# Patient Record
Sex: Male | Born: 1975 | Race: White | Hispanic: No | Marital: Married | Smoking: Former smoker
Health system: Southern US, Community
[De-identification: ages and names within clinical notes are randomized; demographics above are authoritative.]

## PROBLEM LIST (undated history)

## (undated) DIAGNOSIS — M1990 Unspecified osteoarthritis, unspecified site: Secondary | ICD-10-CM

## (undated) DIAGNOSIS — G43909 Migraine, unspecified, not intractable, without status migrainosus: Secondary | ICD-10-CM

## (undated) HISTORY — PX: ANKLE SURGERY: SHX546

## (undated) HISTORY — DX: Unspecified osteoarthritis, unspecified site: M19.90

## (undated) HISTORY — PX: OTHER SURGICAL HISTORY: SHX169

## (undated) HISTORY — PX: NASAL SINUS SURGERY: SHX719

## (undated) HISTORY — DX: Migraine, unspecified, not intractable, without status migrainosus: G43.909

---

## 2009-01-06 ENCOUNTER — Encounter: Admission: RE | Admit: 2009-01-06 | Discharge: 2009-01-06 | Payer: Self-pay | Admitting: Rheumatology

## 2016-06-29 ENCOUNTER — Other Ambulatory Visit: Payer: Self-pay | Admitting: Rheumatology

## 2016-06-29 LAB — COMPLETE METABOLIC PANEL WITH GFR
ALT: 21 U/L (ref 9–46)
AST: 18 U/L (ref 10–40)
Albumin: 4.6 g/dL (ref 3.6–5.1)
Alkaline Phosphatase: 90 U/L (ref 40–115)
BUN: 18 mg/dL (ref 7–25)
CHLORIDE: 110 mmol/L (ref 98–110)
CO2: 21 mmol/L (ref 20–31)
CREATININE: 1.31 mg/dL (ref 0.60–1.35)
Calcium: 9.9 mg/dL (ref 8.6–10.3)
GFR, EST AFRICAN AMERICAN: 79 mL/min (ref >=60)
GFR, Est Non African American: 68 mL/min (ref >=60)
Glucose, Bld: 101 mg/dL — ABNORMAL HIGH (ref 65–99)
POTASSIUM: 4 mmol/L (ref 3.5–5.3)
Sodium: 140 mmol/L (ref 135–146)
Total Bilirubin: 0.6 mg/dL (ref 0.2–1.2)
Total Protein: 6.9 g/dL (ref 6.1–8.1)

## 2016-06-29 LAB — CBC WITH DIFFERENTIAL/PLATELET
BASOS PCT: 0 %
Basophils Absolute: 0 cells/uL (ref 0–200)
EOS ABS: 284 {cells}/uL (ref 15–500)
Eosinophils Relative: 4 %
HEMATOCRIT: 45.5 % (ref 38.5–50.0)
HEMOGLOBIN: 16 g/dL (ref 13.2–17.1)
LYMPHS ABS: 2343 {cells}/uL (ref 850–3900)
LYMPHS PCT: 33 %
MCH: 31.6 pg (ref 27.0–33.0)
MCHC: 35.2 g/dL (ref 32.0–36.0)
MCV: 89.9 fL (ref 80.0–100.0)
MONO ABS: 710 {cells}/uL (ref 200–950)
MPV: 10.8 fL (ref 7.5–12.5)
Monocytes Relative: 10 %
NEUTROS ABS: 3763 {cells}/uL (ref 1500–7800)
Neutrophils Relative %: 53 %
Platelets: 278 10*3/uL (ref 140–400)
RBC: 5.06 MIL/uL (ref 4.20–5.80)
RDW: 13.6 % (ref 11.0–15.0)
WBC: 7.1 10*3/uL (ref 3.8–10.8)

## 2016-07-03 ENCOUNTER — Telehealth: Payer: Self-pay | Admitting: Radiology

## 2016-07-03 NOTE — Telephone Encounter (Signed)
I will call patient to advise labs are normal  

## 2016-07-03 NOTE — Telephone Encounter (Signed)
Number disconnected.

## 2016-08-15 ENCOUNTER — Other Ambulatory Visit: Payer: Self-pay | Admitting: *Deleted

## 2016-08-15 MED ORDER — ADALIMUMAB 40 MG/0.8ML ~~LOC~~ AJKT: 0.8000 mL | AUTO-INJECTOR | SUBCUTANEOUS | 0 refills | Status: DC

## 2016-08-15 NOTE — Telephone Encounter (Signed)
Last Visit:04/19/16 Next Visit due February 2018 Labs: 06/29/16 TB Gold: 12/27/15  Okay to refill Humira?

## 2016-08-15 NOTE — Telephone Encounter (Signed)
ok 

## 2016-10-12 ENCOUNTER — Other Ambulatory Visit: Payer: Self-pay | Admitting: Rheumatology

## 2016-10-12 DIAGNOSIS — M47819 Spondylosis without myelopathy or radiculopathy, site unspecified: Secondary | ICD-10-CM | POA: Insufficient documentation

## 2016-10-12 DIAGNOSIS — Z1589 Genetic susceptibility to other disease: Secondary | ICD-10-CM | POA: Insufficient documentation

## 2016-10-12 LAB — CBC WITH DIFFERENTIAL/PLATELET
BASOS PCT: 0 %
Basophils Absolute: 0 cells/uL (ref 0–200)
Eosinophils Absolute: 158 cells/uL (ref 15–500)
Eosinophils Relative: 2 %
HCT: 42.7 % (ref 38.5–50.0)
Hemoglobin: 14.7 g/dL (ref 13.2–17.1)
LYMPHS PCT: 24 %
Lymphs Abs: 1896 cells/uL (ref 850–3900)
MCH: 31.1 pg (ref 27.0–33.0)
MCHC: 34.4 g/dL (ref 32.0–36.0)
MCV: 90.3 fL (ref 80.0–100.0)
MONOS PCT: 8 %
MPV: 10.3 fL (ref 7.5–12.5)
Monocytes Absolute: 632 cells/uL (ref 200–950)
NEUTROS ABS: 5214 {cells}/uL (ref 1500–7800)
Neutrophils Relative %: 66 %
PLATELETS: 267 10*3/uL (ref 140–400)
RBC: 4.73 MIL/uL (ref 4.20–5.80)
RDW: 13.4 % (ref 11.0–15.0)
WBC: 7.9 10*3/uL (ref 3.8–10.8)

## 2016-10-12 NOTE — Progress Notes (Deleted)
Office Visit Note  Patient: Dennis Todd             Date of Birth: 01-08-1976           MRN: [id number]             PCP: No primary care provider on file. Referring: No ref. provider found Visit Date: 10/23/2016 Occupation: '@GUAROCC' @    Subjective:  No chief complaint on file.   History of Present Illness: Dennis Todd is a 41 y.o. male ***   Activities of Daily Living:  Patient reports morning stiffness for *** {minute/hour:19697}.   Patient {ACTIONS;DENIES/REPORTS:21021675::"Denies"} nocturnal pain.  Difficulty dressing/grooming: {ACTIONS;DENIES/REPORTS:21021675::"Denies"} Difficulty climbing stairs: {ACTIONS;DENIES/REPORTS:21021675::"Denies"} Difficulty getting out of chair: {ACTIONS;DENIES/REPORTS:21021675::"Denies"} Difficulty using hands for taps, buttons, cutlery, and/or writing: {ACTIONS;DENIES/REPORTS:21021675::"Denies"}   No Rheumatology ROS completed.   PMFS History:  Patient Active Problem List   Diagnosis Date Noted  . 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 HLA B27 (HLA B27 positive) 10/12/2016    No past medical history on file.  No family history on file. No past surgical history on file. Social History   Social History Narrative  . No narrative on file     Objective: Vital Signs: There were no vitals taken for this visit.   Physical Exam   Musculoskeletal Exam: ***  CDAI Exam: No CDAI exam completed.    Investigation: Findings:   His labs from 03-30-2008 showed CBC with diff normal, sed rate normal, comprehensive normal, glucose was 112, alkaline phosphatase was elevated at 130. His hepatitis panel was normal. His rheumatoid factor was normal, CCP was normal and G6PD was normal.    August 2017; CBC and comprehensive metabolic panel was normal.  May 2017 TB Gold was negative.  No visits with results within 3 Month(s) from this visit.  Latest known visit with results is:  Orders Only on 06/29/2016  Component Date Value Ref Range  Status  . Sodium 06/29/2016 140  135 - 146 mmol/L Final  . Potassium 06/29/2016 4.0  3.5 - 5.3 mmol/L Final  . Chloride 06/29/2016 110  98 - 110 mmol/L Final  . CO2 06/29/2016 21  20 - 31 mmol/L Final  . Glucose, Bld 06/29/2016 101* 65 - 99 mg/dL Final  . BUN 06/29/2016 18  7 - 25 mg/dL Final  . Creat 06/29/2016 1.31  0.60 - 1.35 mg/dL Final  . Total Bilirubin 06/29/2016 0.6  0.2 - 1.2 mg/dL Final  . Alkaline Phosphatase 06/29/2016 90  40 - 115 U/L Final  . AST 06/29/2016 18  10 - 40 U/L Final  . ALT 06/29/2016 21  9 - 46 U/L Final  . Total Protein 06/29/2016 6.9  6.1 - 8.1 g/dL Final  . Albumin 06/29/2016 4.6  3.6 - 5.1 g/dL Final  . Calcium 06/29/2016 9.9  8.6 - 10.3 mg/dL Final  . GFR, Est African American 06/29/2016 79  >=60 mL/min Final  . GFR, Est Non African American 06/29/2016 68  >=60 mL/min Final  . WBC 06/29/2016 7.1  3.8 - 10.8 K/uL Final  . RBC 06/29/2016 5.06  4.20 - 5.80 MIL/uL Final  . Hemoglobin 06/29/2016 16.0  13.2 - 17.1 g/dL Final  . HCT 06/29/2016 45.5  38.5 - 50.0 % Final  . MCV 06/29/2016 89.9  80.0 - 100.0 fL Final  . MCH 06/29/2016 31.6  27.0 - 33.0 pg Final  . MCHC 06/29/2016 35.2  32.0 - 36.0 g/dL Final  . RDW 06/29/2016 13.6  11.0 - 15.0 % Final  .  Platelets 06/29/2016 278  140 - 400 K/uL Final  . MPV 06/29/2016 10.8  7.5 - 12.5 fL Final  . Neutro Abs 06/29/2016 3763  1,500 - 7,800 cells/uL Final  . Lymphs Abs 06/29/2016 2343  850 - 3,900 cells/uL Final  . Monocytes Absolute 06/29/2016 710  200 - 950 cells/uL Final  . Eosinophils Absolute 06/29/2016 284  15 - 500 cells/uL Final  . Basophils Absolute 06/29/2016 0  0 - 200 cells/uL Final  . Neutrophils Relative % 06/29/2016 53  % Final  . Lymphocytes Relative 06/29/2016 33  % Final  . Monocytes Relative 06/29/2016 10  % Final  . Eosinophils Relative 06/29/2016 4  % Final  . Basophils Relative 06/29/2016 0  % Final  . Smear Review 06/29/2016 Criteria for review not met   Final     Imaging: No  results found.  Speciality Comments: No specialty comments available.    Procedures:  No procedures performed Allergies: Patient has no allergy information on record.   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  No orders of the defined types were placed in this encounter.   Face-to-face time spent with patient was *** minutes. 50% of time was spent in counseling and coordination of care.  Follow-Up Instructions: No Follow-up on file.   Amy Littrell, RT  Note - This record has been created using [employer].  Chart creation errors have been sought, but may not always  have been located. Such creation errors do not reflect on  the standard of medical care.

## 2016-10-13 LAB — COMPLETE METABOLIC PANEL WITH GFR
ALT: 18 U/L (ref 9–46)
AST: 17 U/L (ref 10–40)
Albumin: 4.2 g/dL (ref 3.6–5.1)
Alkaline Phosphatase: 90 U/L (ref 40–115)
BILIRUBIN TOTAL: 0.6 mg/dL (ref 0.2–1.2)
BUN: 17 mg/dL (ref 7–25)
CALCIUM: 9.1 mg/dL (ref 8.6–10.3)
CHLORIDE: 110 mmol/L (ref 98–110)
CO2: 21 mmol/L (ref 20–31)
CREATININE: 1.42 mg/dL — AB (ref 0.60–1.35)
GFR, EST AFRICAN AMERICAN: 71 mL/min (ref >=60)
GFR, EST NON AFRICAN AMERICAN: 62 mL/min (ref >=60)
Glucose, Bld: 124 mg/dL — ABNORMAL HIGH (ref 65–99)
Potassium: 3.6 mmol/L (ref 3.5–5.3)
Sodium: 141 mmol/L (ref 135–146)
Total Protein: 6.5 g/dL (ref 6.1–8.1)

## 2016-10-15 NOTE — Progress Notes (Signed)
Please ask him to take enough fluids. Repeat BMP in 1 month

## 2016-10-16 ENCOUNTER — Other Ambulatory Visit: Payer: Self-pay | Admitting: *Deleted

## 2016-10-16 ENCOUNTER — Telehealth (INDEPENDENT_AMBULATORY_CARE_PROVIDER_SITE_OTHER): Payer: Self-pay | Admitting: Rheumatology

## 2016-10-16 DIAGNOSIS — Z79899 Other long term (current) drug therapy: Secondary | ICD-10-CM

## 2016-10-16 NOTE — Telephone Encounter (Signed)
Patient returned your call regarding his labs.  Cb#[phone].  Thank you.

## 2016-10-16 NOTE — Telephone Encounter (Signed)
Patient advised of lab results and verbalized understanding.  

## 2016-10-20 DIAGNOSIS — B360 Pityriasis versicolor: Secondary | ICD-10-CM | POA: Insufficient documentation

## 2016-10-20 DIAGNOSIS — Z8709 Personal history of other diseases of the respiratory system: Secondary | ICD-10-CM | POA: Insufficient documentation

## 2016-10-20 DIAGNOSIS — Z79899 Other long term (current) drug therapy: Secondary | ICD-10-CM | POA: Insufficient documentation

## 2016-10-23 ENCOUNTER — Ambulatory Visit: Payer: Self-pay | Admitting: Rheumatology

## 2016-10-29 ENCOUNTER — Other Ambulatory Visit: Payer: Self-pay | Admitting: *Deleted

## 2016-10-29 MED ORDER — ADALIMUMAB 40 MG/0.8ML ~~LOC~~ AJKT: 0.8000 mL | AUTO-INJECTOR | SUBCUTANEOUS | 0 refills | Status: DC

## 2016-10-29 NOTE — Telephone Encounter (Signed)
Refill request received via fax  Last Visit:04/19/16 Next Visit 12/19/16 Labs: 10/12/16 Creat 1.42 previous creat 1.31 TB Gold: 12/27/15  Okay to refill Humira?

## 2016-10-29 NOTE — Telephone Encounter (Signed)
ok 

## 2016-12-14 NOTE — Progress Notes (Signed)
Office Visit Note  Patient: Dennis Todd             Date of Birth: Jul 12, 1976           MRN: [id number]             PCP: Patient, No Pcp Per Referring: No ref. provider found Visit Date: 12/19/2016 Occupation: _0 @    Subjective:  Left hand pain   History of Present Illness: [name] is a 41 y.o. male with history of a spondyloarthropathy. He states she's been having pain and discomfort in his left third and fourth finger. He was wearing a ring which he took off. He has significant morning stiffness. He also reports a stiffness after prolonged sitting. He has to use a small tools at work which is also difficult  on his hands. He has history of migraines which got worse recently. He was seen by a neurologist and was started on Topamax. He has noticed improvement in the migraine. He  was taking anti-inflammatories for headaches. Which were discontinued by his neurologist.  Activities of Daily Living:  Patient reports morning stiffness for 2 hours.   Patient Denies nocturnal pain.  Difficulty dressing/grooming: Denies Difficulty climbing stairs: Denies Difficulty getting out of chair: Denies Difficulty using hands for taps, buttons, cutlery, and/or writing: Denies   Review of Systems  Constitutional: Positive for fatigue. Negative for night sweats and weakness ( ).  HENT: Negative for mouth sores, mouth dryness and nose dryness.   Eyes: Negative for redness and dryness.  Respiratory: Negative for shortness of breath and difficulty breathing.   Cardiovascular: Negative for chest pain, palpitations, hypertension, irregular heartbeat and swelling in legs/feet.  Gastrointestinal: Negative for constipation and diarrhea.  Endocrine: Negative for increased urination.  Musculoskeletal: Positive for arthralgias, joint pain and morning stiffness. Negative for joint swelling, myalgias, muscle weakness, muscle tenderness and myalgias.  Skin: Negative for color change, rash,  hair loss, nodules/bumps, skin tightness, ulcers and sensitivity to sunlight.  Allergic/Immunologic: Negative for susceptible to infections.  Neurological: Negative for dizziness, fainting, memory loss and night sweats.  Hematological: Negative for swollen glands.  Psychiatric/Behavioral: Negative for depressed mood and sleep disturbance. The patient is not nervous/anxious.     PMFS History:  Patient Active Problem List   Diagnosis Date Noted  . History of migraine 12/19/2016  . High risk medication use 10/20/2016  . Personal history of chronic sinusitis 10/20/2016  . Tinea versicolor 10/20/2016  . 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 Arthritis   . Migraine     No family history on file. Past Surgical History:  Procedure Laterality Date  . NASAL SINUS SURGERY     Social History   Social History Narrative  . No narrative on file     Objective: Vital Signs: BP 122/78   Pulse 80   Resp 14   Ht _1  (1.753 m)   Wt 220 lb (99.8 kg)   BMI 32.49 kg/m    Physical Exam  Constitutional: He is oriented to person, place, and time. He appears well-developed and well-nourished.  HENT:  Head: Normocephalic and atraumatic.  Eyes: Conjunctivae and EOM are normal. Pupils are equal, round, and reactive to light.  Neck: Normal range of motion. Neck supple.  Cardiovascular: Normal rate, regular rhythm and normal heart sounds.   Pulmonary/Chest: Effort normal and breath sounds normal.  Abdominal: Soft. Bowel sounds are  normal.  Neurological: He is alert and oriented to person, place, and time.  Skin: Skin is warm and dry. Capillary refill takes less than 2 seconds.  Psychiatric: He has a normal mood and affect. His behavior is normal.  Nursing note and vitals reviewed.    Musculoskeletal Exam: C-spine and thoracic lumbar spine good range of motion he has some stiffness with range of motion of his  lumbar spine. Shoulder joints elbow joints wrist joints MCPs PIPs DIPs with good range of motion with no synovitis. Hip joints knee joints ankles MTPs PIPs DIPs are good range of motion with no synovitis.  CDAI Exam: CDAI Homunculus Exam:   Joint Counts:  CDAI Tender Joint count: 0 CDAI Swollen Joint count: 0  Global Assessments:  Patient Global Assessment: 3 Provider Global Assessment: 3  CDAI Calculated Score: 6    Investigation: Findings:  His labs from 03-30-2008 showed CBC with diff normal, sed rate normal, comprehensive normal, glucose was 112, alkaline phosphatase was elevated at 130. His hepatitis panel was normal. His rheumatoid factor was normal, CCP was normal and G6PD was normal.    03/2008 His labs from Dr. Rhona Raider office revealed HLAB-27 was positive. ANA was negative.  Uric acid was 6.9.  C-reactive protein was 1.4.    12/30/2015 negative TB gold  CBC Latest Ref Rng & Units 10/12/2016 06/29/2016  WBC 3.8 - 10.8 K/uL 7.9 7.1  Hemoglobin 13.2 - 17.1 g/dL 14.7 16.0  Hematocrit 38.5 - 50.0 % 42.7 45.5  Platelets 140 - 400 K/uL 267 278   CMP Latest Ref Rng & Units 10/12/2016 06/29/2016  Glucose 65 - 99 mg/dL 124(H) 101(H)  BUN 7 - 25 mg/dL 17 18  Creatinine 0.60 - 1.35 mg/dL 1.42(H) 1.31  Sodium 135 - 146 mmol/L 141 140  Potassium 3.5 - 5.3 mmol/L 3.6 4.0  Chloride 98 - 110 mmol/L 110 110  CO2 20 - 31 mmol/L 21 21  Calcium 8.6 - 10.3 mg/dL 9.1 9.9  Total Protein 6.1 - 8.1 g/dL 6.5 6.9  Total Bilirubin 0.2 - 1.2 mg/dL 0.6 0.6  Alkaline Phos 40 - 115 U/L 90 90  AST 10 - 40 U/L 17 18  ALT 9 - 46 U/L 18 21    Imaging: [place] Extrem Up Bilat Comp  Result Date: 12/19/2016 Ultrasound examination of bilateral hands was performed per EULAR recommendations. Using 12 MHz transducer, grayscale and power Doppler bilateral second, third, and fifth MCP joints, left fourth MCP joint and bilateral wrist joints both dorsal and volar aspects were evaluated to look for synovitis or  tenosynovitis. The findings were there was no synovitis or tenosynovitis on ultrasound examination. Right median nerve was 0.14 cm squares which was within the upper limits of normal and left median nerve was 0.11 cm squares which was within normal limits Impression: Ultrasound examination did not show any synovitis or tenosynovitis. Right median nerve was upper limits of normal.   Speciality Comments: No specialty comments available.    Procedures:  No procedures performed Allergies: Patient has no known allergies.   Assessment / Plan:     Visit Diagnoses: Spondyloarthritis [facility]): Patient complains of increased pain and stiffness lately. He also complains of increased pain and discomfort in his hands especially his left third and fourth MCP joints. I did not find any synovitis on examination.  HLA B27 (HLA B27 positive)  High risk medication use - Humira 50 mg sq qweek -his creatinine was elevated recently he states she's been taking a lot of  anti-inflammatories for migraines. But since he restarted Topamax is a stopped addendum laboratories. Plan: Quantiferon tb gold assay (blood), CBC with Differential/Platelet, COMPLETE METABOLIC PANEL WITH GFR, CBC with Differential/Platelet, COMPLETE METABOLIC PANEL WITH GFR  Personal history of chronic sinusitis: Doing better currently  Tinea versicolor: No active lesions  History of migraine: Recently started Topamax which is improved as migraines  Pain in both hands - Plan: [place] Extrem Up Bilat Comp : Ultrasound examination today did not reveal any synovitis. Joint protection and muscle strengthening was discussed. I've also given a list of some natural anti-inflammatories to try.   Orders: Orders Placed This Encounter  Procedures  . [place] Extrem Up Bilat Comp  . Quantiferon tb gold assay (blood)  . CBC with Differential/Platelet  . COMPLETE METABOLIC PANEL WITH GFR  . CBC with Differential/Platelet  . COMPLETE METABOLIC PANEL WITH GFR   No  orders of the defined types were placed in this encounter.   Face-to-face time spent with patient was 30 minutes. 50% of time was spent in counseling and coordination of care.  Follow-Up Instructions: Return in about 5 months (around 05/21/2017) for Spondyloarthropathy.   [name], MD  Note - This record has been created using [employer], commissioning.  Chart creation errors have been sought, but may not always  have been located. Such creation errors do not reflect on  the standard of medical care.

## 2016-12-19 ENCOUNTER — Encounter: Payer: Self-pay | Admitting: Rheumatology

## 2016-12-19 ENCOUNTER — Inpatient Hospital Stay (INDEPENDENT_AMBULATORY_CARE_PROVIDER_SITE_OTHER): Payer: Self-pay

## 2016-12-19 ENCOUNTER — Ambulatory Visit (INDEPENDENT_AMBULATORY_CARE_PROVIDER_SITE_OTHER): Payer: BLUE CROSS/BLUE SHIELD | Admitting: Rheumatology

## 2016-12-19 VITALS — BP 122/78 | HR 80 | Resp 14 | Ht 69.0 in | Wt 220.0 lb

## 2016-12-19 DIAGNOSIS — M4690 Unspecified inflammatory spondylopathy, site unspecified: Secondary | ICD-10-CM | POA: Diagnosis not present

## 2016-12-19 DIAGNOSIS — Z8669 Personal history of other diseases of the nervous system and sense organs: Secondary | ICD-10-CM | POA: Diagnosis not present

## 2016-12-19 DIAGNOSIS — M47819 Spondylosis without myelopathy or radiculopathy, site unspecified: Secondary | ICD-10-CM

## 2016-12-19 DIAGNOSIS — M79642 Pain in left hand: Secondary | ICD-10-CM | POA: Diagnosis not present

## 2016-12-19 DIAGNOSIS — Z8709 Personal history of other diseases of the respiratory system: Secondary | ICD-10-CM | POA: Diagnosis not present

## 2016-12-19 DIAGNOSIS — M79641 Pain in right hand: Secondary | ICD-10-CM

## 2016-12-19 DIAGNOSIS — Z1589 Genetic susceptibility to other disease: Secondary | ICD-10-CM | POA: Diagnosis not present

## 2016-12-19 DIAGNOSIS — Z79899 Other long term (current) drug therapy: Secondary | ICD-10-CM

## 2016-12-19 DIAGNOSIS — B360 Pityriasis versicolor: Secondary | ICD-10-CM

## 2016-12-19 LAB — CBC WITH DIFFERENTIAL/PLATELET
BASOS ABS: 0 {cells}/uL (ref 0–200)
Basophils Relative: 0 %
EOS ABS: 284 {cells}/uL (ref 15–500)
Eosinophils Relative: 4 %
HCT: 49.5 % (ref 38.5–50.0)
Hemoglobin: 17 g/dL (ref 13.2–17.1)
LYMPHS PCT: 28 %
Lymphs Abs: 1988 cells/uL (ref 850–3900)
MCH: 31.5 pg (ref 27.0–33.0)
MCHC: 34.3 g/dL (ref 32.0–36.0)
MCV: 91.7 fL (ref 80.0–100.0)
MONOS PCT: 8 %
MPV: 10.6 fL (ref 7.5–12.5)
Monocytes Absolute: 568 cells/uL (ref 200–950)
Neutro Abs: 4260 cells/uL (ref 1500–7800)
Neutrophils Relative %: 60 %
PLATELETS: 254 10*3/uL (ref 140–400)
RBC: 5.4 MIL/uL (ref 4.20–5.80)
RDW: 13.4 % (ref 11.0–15.0)
WBC: 7.1 10*3/uL (ref 3.8–10.8)

## 2016-12-19 LAB — COMPLETE METABOLIC PANEL WITH GFR
ALBUMIN: 4.5 g/dL (ref 3.6–5.1)
ALK PHOS: 87 U/L (ref 40–115)
ALT: 25 U/L (ref 9–46)
AST: 21 U/L (ref 10–40)
BILIRUBIN TOTAL: 0.8 mg/dL (ref 0.2–1.2)
BUN: 19 mg/dL (ref 7–25)
CALCIUM: 9.5 mg/dL (ref 8.6–10.3)
CO2: 24 mmol/L (ref 20–31)
CREATININE: 1.24 mg/dL (ref 0.60–1.35)
Chloride: 107 mmol/L (ref 98–110)
GFR, EST AFRICAN AMERICAN: 84 mL/min (ref >=60)
GFR, EST NON AFRICAN AMERICAN: 72 mL/min (ref >=60)
Glucose, Bld: 70 mg/dL (ref 65–99)
Potassium: 4.3 mmol/L (ref 3.5–5.3)
Sodium: 143 mmol/L (ref 135–146)
TOTAL PROTEIN: 7 g/dL (ref 6.1–8.1)

## 2016-12-19 NOTE — Patient Instructions (Addendum)
Standing Labs We placed an order today for your standing lab work.    Please come back and get your standing labs in August and every 3 months  We have open lab Monday through Friday from 8:30-11:30 AM and 1:30-4 PM at the office of Dr. Arbutus PedShaili Tyona Nilsen/Naitik Panwala, PA.   The office is located at [address], [address], [place], [place] [address] No appointment is necessary.   Labs are drawn by [facility].  You may receive a bill from [facility] for your lab work.    Supplements for OA Natural anti-inflammatories  You can purchase these at [employer], [employer] or online.  . Turmeric (capsules)  . Ginger (ginger root or capsules)  . Omega 3 (Fish, flax seeds, chia seeds, walnuts, almonds)  . Tart cherry (dried or extract)   Patient should be under the care of a physician while taking these supplements. This may not be reproduced without the permission of Dr. Pollyann SavoyShaili Shamica Moree.

## 2016-12-21 LAB — QUANTIFERON TB GOLD ASSAY (BLOOD)
Interferon Gamma Release Assay: NEGATIVE
MITOGEN-NIL SO: 9.56 [IU]/mL
QUANTIFERON NIL VALUE: 0.02 [IU]/mL
QUANTIFERON TB AG MINUS NIL: 0 [IU]/mL

## 2016-12-21 NOTE — Progress Notes (Signed)
WNL

## 2017-01-11 ENCOUNTER — Other Ambulatory Visit: Payer: Self-pay | Admitting: Rheumatology

## 2017-01-11 NOTE — Telephone Encounter (Signed)
ok 

## 2017-01-11 NOTE — Telephone Encounter (Signed)
Last Visit: 12/19/16 Next Visit: 05/21/17 Labs: 12/19/16 WNL TB Gold: 12/19/16 Neg   Okay to refill Humira?

## 2017-03-30 ENCOUNTER — Other Ambulatory Visit: Payer: Self-pay | Admitting: Rheumatology

## 2017-04-01 NOTE — Telephone Encounter (Signed)
12/19/16 last visit 05/21/17 next visit    CBC Latest Ref Rng & Units 12/19/2016 10/12/2016 06/29/2016  WBC 3.8 - 10.8 K/uL 7.1 7.9 7.1  Hemoglobin 13.2 - 17.1 g/dL 60.7 37.1 06.2  Hematocrit 38.5 - 50.0 % 49.5 42.7 45.5  Platelets 140 - 400 K/uL 254 267 278   CMP Latest Ref Rng & Units 12/19/2016 10/12/2016 06/29/2016  Glucose 65 - 99 mg/dL 70 694(W) 546(E)  BUN 7 - 25 mg/dL 19 17 18   Creatinine 0.60 - 1.35 mg/dL 7.03 5.00(X) 3.81  Sodium 135 - 146 mmol/L 143 141 140  Potassium 3.5 - 5.3 mmol/L 4.3 3.6 4.0  Chloride 98 - 110 mmol/L 107 110 110  CO2 20 - 31 mmol/L 24 21 21   Calcium 8.6 - 10.3 mg/dL 9.5 9.1 9.9  Total Protein 6.1 - 8.1 g/dL 7.0 6.5 6.9  Total Bilirubin 0.2 - 1.2 mg/dL 0.8 0.6 0.6  Alkaline Phos 40 - 115 U/L 87 90 90  AST 10 - 40 U/L 21 17 18   ALT 9 - 46 U/L 25 18 21    TB gold negative in May   Labs past due. Called patient to advise.

## 2017-04-02 ENCOUNTER — Other Ambulatory Visit: Payer: Self-pay | Admitting: *Deleted

## 2017-04-02 DIAGNOSIS — Z79899 Other long term (current) drug therapy: Secondary | ICD-10-CM

## 2017-04-02 LAB — CBC WITH DIFFERENTIAL/PLATELET
Basophils Absolute: 0 cells/uL (ref 0–200)
Basophils Relative: 0 %
EOS ABS: 252 {cells}/uL (ref 15–500)
Eosinophils Relative: 4 %
HEMATOCRIT: 48 % (ref 38.5–50.0)
Hemoglobin: 16.7 g/dL (ref 13.2–17.1)
LYMPHS PCT: 24 %
Lymphs Abs: 1512 cells/uL (ref 850–3900)
MCH: 31.8 pg (ref 27.0–33.0)
MCHC: 34.8 g/dL (ref 32.0–36.0)
MCV: 91.4 fL (ref 80.0–100.0)
MONO ABS: 567 {cells}/uL (ref 200–950)
MONOS PCT: 9 %
MPV: 10.5 fL (ref 7.5–12.5)
NEUTROS PCT: 63 %
Neutro Abs: 3969 cells/uL (ref 1500–7800)
PLATELETS: 253 10*3/uL (ref 140–400)
RBC: 5.25 MIL/uL (ref 4.20–5.80)
RDW: 13.6 % (ref 11.0–15.0)
WBC: 6.3 10*3/uL (ref 3.8–10.8)

## 2017-04-03 ENCOUNTER — Other Ambulatory Visit: Payer: Self-pay | Admitting: Rheumatology

## 2017-04-03 LAB — COMPLETE METABOLIC PANEL WITH GFR
ALT: 23 U/L (ref 9–46)
AST: 20 U/L (ref 10–40)
Albumin: 4.5 g/dL (ref 3.6–5.1)
Alkaline Phosphatase: 96 U/L (ref 40–115)
BUN: 19 mg/dL (ref 7–25)
CHLORIDE: 108 mmol/L (ref 98–110)
CO2: 21 mmol/L (ref 20–32)
CREATININE: 1.25 mg/dL (ref 0.60–1.35)
Calcium: 9.3 mg/dL (ref 8.6–10.3)
GFR, Est African American: 83 mL/min (ref >=60)
GFR, Est Non African American: 72 mL/min (ref >=60)
Glucose, Bld: 107 mg/dL — ABNORMAL HIGH (ref 65–99)
POTASSIUM: 3.9 mmol/L (ref 3.5–5.3)
Sodium: 141 mmol/L (ref 135–146)
Total Bilirubin: 0.9 mg/dL (ref 0.2–1.2)
Total Protein: 6.6 g/dL (ref 6.1–8.1)

## 2017-04-03 NOTE — Telephone Encounter (Signed)
12/19/16 last visit 05/21/17 next visit  Labs: 04/02/17 TB Gold: 12/2016 Neg  Okay to refill per Dr. Corliss Skains

## 2017-04-03 NOTE — Progress Notes (Signed)
Within normal limits

## 2017-05-13 NOTE — Progress Notes (Signed)
Office Visit Note  Patient: Dennis Todd             Date of Birth: Dec 29, 1975           MRN: [id number]             PCP: Patient, No Pcp Per Referring: No ref. provider found Visit Date: 05/21/2017 Occupation: '@GUAROCC' @    Subjective:  Upper and lower back pain.   History of Present Illness: Dennis Todd is a 41 y.o. male with history of spondyloarthropathy. He states recently his been having increased pain in the thoracic and lumbar region. He does some stretching in the morning. He states the pain gets worse after prolonged standing and walking on the concrete all day. He denies any episodes of joint pain or joint swelling. . Seeing increased fatigue. He states he had a sleep study which was normal.  Activities of Daily Living:  Patient reports morning stiffness for 15 minute.   Patient Denies nocturnal pain.  Difficulty dressing/grooming: Denies Difficulty climbing stairs: Denies Difficulty getting out of chair: Denies Difficulty using hands for taps, buttons, cutlery, and/or writing: Denies   Review of Systems  Constitutional: Positive for fatigue. Negative for night sweats and weakness ( ).  HENT: Negative for mouth sores, mouth dryness and nose dryness.   Eyes: Negative for redness and dryness.  Respiratory: Negative for shortness of breath and difficulty breathing.   Cardiovascular: Negative for chest pain, palpitations, hypertension, irregular heartbeat and swelling in legs/feet.  Gastrointestinal: Negative for constipation and diarrhea.  Endocrine: Negative for increased urination.  Musculoskeletal: Positive for arthralgias, joint pain and morning stiffness. Negative for joint swelling, myalgias, muscle weakness, muscle tenderness and myalgias.  Skin: Negative for color change, rash, hair loss, nodules/bumps, skin tightness, ulcers and sensitivity to sunlight.  Allergic/Immunologic: Negative for susceptible to infections.  Neurological: Negative for dizziness,  fainting, memory loss and night sweats.  Hematological: Negative for swollen glands.  Psychiatric/Behavioral: Negative for depressed mood and sleep disturbance. The patient is not nervous/anxious.     PMFS History:  Patient Active Problem List   Diagnosis Date Noted  . History of migraine 12/19/2016  . High risk medication use 10/20/2016  . Personal history of chronic sinusitis 10/20/2016  . Tinea versicolor 10/20/2016  . 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 Arthritis   . Migraine     No family history on file. Past Surgical History:  Procedure Laterality Date  . NASAL SINUS SURGERY     Social History   Social History Narrative  . No narrative on file     Objective: Vital Signs: BP 132/74   Pulse 64   Resp 16   Ht '5\' 9"'  (1.753 m)   Wt 219 lb (99.3 kg)   BMI 32.34 kg/m    Physical Exam  Constitutional: He is oriented to person, place, and time. He appears well-developed and well-nourished.  HENT:  Head: Normocephalic and atraumatic.  Eyes: Pupils are equal, round, and reactive to light. Conjunctivae and EOM are normal.  Neck: Normal range of motion. Neck supple.  Cardiovascular: Normal rate, regular rhythm and normal heart sounds.   Pulmonary/Chest: Effort normal and breath sounds normal.  Abdominal: Soft. Bowel sounds are normal.  Neurological: He is alert and oriented to person, place, and time.  Skin: Skin is warm and dry. Capillary refill takes less than 2 seconds.  Psychiatric: He has a normal  mood and affect. His behavior is normal.  Nursing note and vitals reviewed.    Musculoskeletal Exam: C-spine and thoracic lumbar spine discomfort with range of motion. He is some tenderness over right SI joint. Shoulder joints elbow joints wrist joint MCPs PIPs DIPs with good range of motion. His some thickening of DIPs of his hands. Hip joints knee joints ankles MTPs PIPs DIPs with good  range of motion with no synovitis.  CDAI Exam: CDAI Homunculus Exam:   Joint Counts:  CDAI Tender Joint count: 0 CDAI Swollen Joint count: 0  Global Assessments:  Patient Global Assessment: 3 Provider Global Assessment: 3  CDAI Calculated Score: 6    Investigation: Findings:  12/19/16 negative TB gold   CBC Latest Ref Rng & Units 04/02/2017 12/19/2016 10/12/2016  WBC 3.8 - 10.8 K/uL 6.3 7.1 7.9  Hemoglobin 13.2 - 17.1 g/dL 16.7 17.0 14.7  Hematocrit 38.5 - 50.0 % 48.0 49.5 42.7  Platelets 140 - 400 K/uL 253 254 267   CMP Latest Ref Rng & Units 04/02/2017 12/19/2016 10/12/2016  Glucose 65 - 99 mg/dL 107(H) 70 124(H)  BUN 7 - 25 mg/dL '19 19 17  ' Creatinine 0.60 - 1.35 mg/dL 1.25 1.24 1.42(H)  Sodium 135 - 146 mmol/L 141 143 141  Potassium 3.5 - 5.3 mmol/L 3.9 4.3 3.6  Chloride 98 - 110 mmol/L 108 107 110  CO2 20 - 32 mmol/L '21 24 21  ' Calcium 8.6 - 10.3 mg/dL 9.3 9.5 9.1  Total Protein 6.1 - 8.1 g/dL 6.6 7.0 6.5  Total Bilirubin 0.2 - 1.2 mg/dL 0.9 0.8 0.6  Alkaline Phos 40 - 115 U/L 96 87 90  AST 10 - 40 U/L '20 21 17  ' ALT 9 - 46 U/L '23 25 18   ' Imaging: Xr Thoracic Spine 2 View  Result Date: 05/21/2017 No discussed narrowing or syndesmophytes were noted.  Xr Lumbar Spine 2-3 Views  Result Date: 05/21/2017 No significant discussed this narrowing was noted. No syndesmophytes were noted. He had mild facet joint arthropathy.  Xr Pelvis 1-2 Views  Result Date: 05/21/2017 No SI joint sclerosis are narrowing was noted.   Speciality Comments: No specialty comments available.    Procedures:  No procedures performed Allergies: Patient has no known allergies.   Assessment / Plan:     Visit Diagnoses: Spondyloarthritis -patient is experiencing increased thoracic and lumbar pain and also some discomfort in the right SI joint. I'm uncertain if it's related to his underlying disease or related to his work. I will obtain following labs and x-rays today. He has not had any increased  joint swelling or episodes of plantar fasciitis or Achilles tendinitis. Plan: Sedimentation rate  HLA B27 (HLA B27 positive)  High risk medication use - Humira 40 mg subcutaneous every other week. His labs have been stable and he's been tolerating medications well.  Chronic midline thoracic back pain - Plan: XR Thoracic Spine 2 View: The x-rays were unremarkable.  Chronic midline low back pain without sciatica - Plan: XR Pelvis 1-2 Views, XR Lumbar Spine 2-3 Views: The x-rays were unremarkable.  History of migraine: He is on Topamax at bedtime.  Personal history of chronic sinusitis  Tinea versicolor: No recent lesions.  Other fatigue - Plan: VITAMIN D 25 Hydroxy (Vit-D Deficiency, Fractures), Testosterone . He has had history of vitamin D deficiency in the past.   Orders: Orders Placed This Encounter  Procedures  . XR Pelvis 1-2 Views  . XR Thoracic Spine 2 View  . XR Lumbar  Spine 2-3 Views  . Sedimentation rate  . VITAMIN D 25 Hydroxy (Vit-D Deficiency, Fractures)  . Testosterone   Meds ordered this encounter  Medications  . methocarbamol (ROBAXIN) 750 MG tablet    Sig: Twice a day after meals when necessary    Dispense:  60 tablet    Refill:  0    Face-to-face time spent with patient was 30 minutes. Greater than 50% of time was spent in counseling and coordination of care.  Follow-Up Instructions: Return in about 5 months (around 10/19/2017) for Spondyloarthropathy.   [name], MD  Note - This record has been created using Editor, commissioning.  Chart creation errors have been sought, but may not always  have been located. Such creation errors do not reflect on  the standard of medical care.

## 2017-05-21 ENCOUNTER — Ambulatory Visit (INDEPENDENT_AMBULATORY_CARE_PROVIDER_SITE_OTHER): Payer: BLUE CROSS/BLUE SHIELD | Admitting: Rheumatology

## 2017-05-21 ENCOUNTER — Encounter: Payer: Self-pay | Admitting: Rheumatology

## 2017-05-21 ENCOUNTER — Telehealth: Payer: Self-pay | Admitting: Rheumatology

## 2017-05-21 ENCOUNTER — Ambulatory Visit (INDEPENDENT_AMBULATORY_CARE_PROVIDER_SITE_OTHER): Payer: BLUE CROSS/BLUE SHIELD

## 2017-05-21 ENCOUNTER — Other Ambulatory Visit: Payer: Self-pay | Admitting: *Deleted

## 2017-05-21 VITALS — BP 132/74 | HR 64 | Resp 16 | Ht 69.0 in | Wt 219.0 lb

## 2017-05-21 DIAGNOSIS — M546 Pain in thoracic spine: Secondary | ICD-10-CM | POA: Diagnosis not present

## 2017-05-21 DIAGNOSIS — Z79899 Other long term (current) drug therapy: Secondary | ICD-10-CM

## 2017-05-21 DIAGNOSIS — G8929 Other chronic pain: Secondary | ICD-10-CM

## 2017-05-21 DIAGNOSIS — R5383 Other fatigue: Secondary | ICD-10-CM

## 2017-05-21 DIAGNOSIS — Z8669 Personal history of other diseases of the nervous system and sense organs: Secondary | ICD-10-CM

## 2017-05-21 DIAGNOSIS — Z1589 Genetic susceptibility to other disease: Secondary | ICD-10-CM

## 2017-05-21 DIAGNOSIS — Z8709 Personal history of other diseases of the respiratory system: Secondary | ICD-10-CM

## 2017-05-21 DIAGNOSIS — B360 Pityriasis versicolor: Secondary | ICD-10-CM

## 2017-05-21 DIAGNOSIS — M545 Low back pain: Secondary | ICD-10-CM | POA: Diagnosis not present

## 2017-05-21 DIAGNOSIS — M47819 Spondylosis without myelopathy or radiculopathy, site unspecified: Secondary | ICD-10-CM | POA: Diagnosis not present

## 2017-05-21 MED ORDER — METHOCARBAMOL 750 MG PO TABS: ORAL_TABLET | ORAL | 0 refills | Status: DC

## 2017-05-21 MED ORDER — ADALIMUMAB 40 MG/0.8ML ~~LOC~~ AJKT: 40.0000 mg | AUTO-INJECTOR | SUBCUTANEOUS | 0 refills | Status: DC

## 2017-05-21 NOTE — Telephone Encounter (Signed)
Patient left a message stating Rx for Robaxin was sent to [facility]. Patient needs it sent to [facility]. Please call to advise.

## 2017-05-21 NOTE — Telephone Encounter (Signed)
Resent prescription to local pharmacy. Patient advised.

## 2017-05-21 NOTE — Patient Instructions (Addendum)
Standing Labs We placed an order today for your standing lab work.    Please come back and get your standing labs in November and every 3 months  We have open lab Monday through Friday from 8:30-11:30 AM and 1:30-4 PM at the office of Dr. Pollyann Savoy.   The office is located at [address], [place], [place] [address] No appointment is necessary.   Labs are drawn by [facility].  You may receive a bill from [facility] for your lab work. If you have any questions regarding directions or hours of operation,  please call [phone].    Back Exercises The following exercises strengthen the muscles that help to support the back. They also help to keep the lower back flexible. Doing these exercises can help to prevent back pain or lessen existing pain. If you have back pain or discomfort, try doing these exercises 2-3 times each day or as told by your health care provider. When the pain goes away, do them once each day, but increase the number of times that you repeat the steps for each exercise (do more repetitions). If you do not have back pain or discomfort, do these exercises once each day or as told by your health care provider. Exercises Single Knee to Chest  Repeat these steps 3-5 times for each leg: 1. Lie on your back on a firm bed or the floor with your legs extended. 2. Bring one knee to your chest. Your other leg should stay extended and in contact with the floor. 3. Hold your knee in place by grabbing your knee or thigh. 4. Pull on your knee until you feel a gentle stretch in your lower back. 5. Hold the stretch for 10-30 seconds. 6. Slowly release and straighten your leg.  Pelvic Tilt  Repeat these steps 5-10 times: 1. Lie on your back on a firm bed or the floor with your legs extended. 2. Bend your knees so they are pointing toward the ceiling and your feet are flat on the floor. 3. Tighten your lower abdominal muscles to press your lower back against the floor.  This motion will tilt your pelvis so your tailbone points up toward the ceiling instead of pointing to your feet or the floor. 4. With gentle tension and even breathing, hold this position for 5-10 seconds.  Cat-Cow  Repeat these steps until your lower back becomes more flexible: 1. Get into a hands-and-knees position on a firm surface. Keep your hands under your shoulders, and keep your knees under your hips. You may place padding under your knees for comfort. 2. Let your head hang down, and point your tailbone toward the floor so your lower back becomes rounded like the back of a cat. 3. Hold this position for 5 seconds. 4. Slowly lift your head and point your tailbone up toward the ceiling so your back forms a sagging arch like the back of a cow. 5. Hold this position for 5 seconds.  Press-Ups  Repeat these steps 5-10 times: 1. Lie on your abdomen (face-down) on the floor. 2. Place your palms near your head, about shoulder-width apart. 3. While you keep your back as relaxed as possible and keep your hips on the floor, slowly straighten your arms to raise the top half of your body and lift your shoulders. Do not use your back muscles to raise your upper torso. You may adjust the placement of your hands to make yourself more comfortable. 4. Hold this position for 5  seconds while you keep your back relaxed. 5. Slowly return to lying flat on the floor.  Bridges  Repeat these steps 10 times: 1. Lie on your back on a firm surface. 2. Bend your knees so they are pointing toward the ceiling and your feet are flat on the floor. 3. Tighten your buttocks muscles and lift your buttocks off of the floor until your waist is at almost the same height as your knees. You should feel the muscles working in your buttocks and the back of your thighs. If you do not feel these muscles, slide your feet 1-2 inches farther away from your buttocks. 4. Hold this position for 3-5 seconds. 5. Slowly lower your  hips to the starting position, and allow your buttocks muscles to relax completely.  If this exercise is too easy, try doing it with your arms crossed over your chest. Abdominal Crunches  Repeat these steps 5-10 times: 1. Lie on your back on a firm bed or the floor with your legs extended. 2. Bend your knees so they are pointing toward the ceiling and your feet are flat on the floor. 3. Cross your arms over your chest. 4. Tip your chin slightly toward your chest without bending your neck. 5. Tighten your abdominal muscles and slowly raise your trunk (torso) high enough to lift your shoulder blades a tiny bit off of the floor. Avoid raising your torso higher than that, because it can put too much stress on your low back and it does not help to strengthen your abdominal muscles. 6. Slowly return to your starting position.  Back Lifts Repeat these steps 5-10 times: 1. Lie on your abdomen (face-down) with your arms at your sides, and rest your forehead on the floor. 2. Tighten the muscles in your legs and your buttocks. 3. Slowly lift your chest off of the floor while you keep your hips pressed to the floor. Keep the back of your head in line with the curve in your back. Your eyes should be looking at the floor. 4. Hold this position for 3-5 seconds. 5. Slowly return to your starting position.  Contact a health care provider if:  Your back pain or discomfort gets much worse when you do an exercise.  Your back pain or discomfort does not lessen within 2 hours after you exercise. If you have any of these problems, stop doing these exercises right away. Do not do them again unless your health care provider says that you can. Get help right away if:  You develop sudden, severe back pain. If this happens, stop doing the exercises right away. Do not do them again unless your health care provider says that you can. This information is not intended to replace advice given to you by your health care  provider. Make sure you discuss any questions you have with your health care provider. Document Released: 09/06/2004 Document Revised: 12/07/2015 Document Reviewed: 09/23/2014 Elsevier Interactive Patient Education  2017 [employer].

## 2017-05-21 NOTE — Telephone Encounter (Signed)
Last Visit: 05/21/17 Next Visit: 11/01/17 Labs: 04/02/17 WNL TB Gold: 12/19/16 Neg  Okay to refill per Dr. Deveshwar 

## 2017-05-22 ENCOUNTER — Telehealth: Payer: Self-pay | Admitting: Radiology

## 2017-05-22 DIAGNOSIS — E559 Vitamin D deficiency, unspecified: Secondary | ICD-10-CM

## 2017-05-22 LAB — TESTOSTERONE: TESTOSTERONE: 414 ng/dL (ref 250–827)

## 2017-05-22 LAB — VITAMIN D 25 HYDROXY (VIT D DEFICIENCY, FRACTURES): VIT D 25 HYDROXY: 21 ng/mL — AB (ref 30–100)

## 2017-05-22 LAB — SEDIMENTATION RATE: SED RATE: 2 mm/h (ref 0–15)

## 2017-05-22 MED ORDER — VITAMIN D3 1.25 MG (50000 UT) PO CAPS: 50000.0000 [IU] | ORAL_CAPSULE | ORAL | 0 refills | Status: AC

## 2017-05-22 NOTE — Telephone Encounter (Signed)
I have called patient to advise.  

## 2017-05-22 NOTE — Telephone Encounter (Signed)
-----   Message from Pollyann Savoy, MD sent at 05/22/2017 12:19 PM EDT ----- Labs are normal except for vitamin D deficiency. Patient is experiencing a lot of fatigue. He is call and vitamin D 50,000 units once a week for 3 months and repeat labs in 3 months.

## 2017-05-22 NOTE — Progress Notes (Signed)
Labs are normal except for vitamin D deficiency. Patient is experiencing a lot of fatigue. He is call and vitamin D 50,000 units once a week for 3 months and repeat labs in 3 months.

## 2017-06-20 ENCOUNTER — Other Ambulatory Visit: Payer: Self-pay | Admitting: Rheumatology

## 2017-06-20 NOTE — Telephone Encounter (Signed)
Last Visit: 05/21/17 Next Visit: 11/01/17 Labs: 04/02/17 WNL TB Gold: 12/19/16 Neg  Okay to refill per Dr. Corliss Skainseveshwar

## 2017-07-29 ENCOUNTER — Telehealth: Payer: Self-pay | Admitting: Rheumatology

## 2017-07-29 ENCOUNTER — Telehealth (INDEPENDENT_AMBULATORY_CARE_PROVIDER_SITE_OTHER): Payer: Self-pay

## 2017-07-29 NOTE — Telephone Encounter (Signed)
Patient states insurance company needs prior auth sent for Humira. Ph # [phone] send in Urgent per insurance. Patient is due in two weeks.

## 2017-07-29 NOTE — Telephone Encounter (Signed)
Patient would like a Rx refill on Humira Pen.  Patient will CB with pharmacy due to insurance changing.  CB# is [phone].  Please advise.  Thank You.

## 2017-07-30 MED ORDER — ADALIMUMAB 40 MG/0.4ML ~~LOC~~ AJKT: 40.0000 mg | AUTO-INJECTOR | SUBCUTANEOUS | 0 refills | Status: DC

## 2017-07-30 NOTE — Telephone Encounter (Signed)
Called patient's new insurance to submit a prior authorization for HUMIRA. Spoke with Karmen BongoJalisa who was able to process authorization over the phone. Humira 40mg /0.574ml has been approved from 07/30/2017 through 07/30/2018.   Authorization number: [id number] Phone: [phone]  Will send document to scan center once received.   Called patient to update. He needs a refill sent to his pharmacy ([facility]). His next injection is schedule for 08/13/17. I informed patient of the new CF form of Humira and patient would like to try it. Sue Lushndrea send in new Rx to pharmacy.   Etna Forquer, [place], CPhT 9:04 AM

## 2017-07-30 NOTE — Addendum Note (Signed)
Addended by: Henriette CombsHATTON, Jalaysha Skilton L on: 07/30/2017 09:09 AM   Modules accepted: Orders

## 2017-07-30 NOTE — Telephone Encounter (Signed)
Last Visit: 05/21/17 Next Visit: 11/01/17 Labs: 04/02/17 WNL TB Gold: 12/19/16 Neg  Patient will update labs on 07/31/17.  Okay to refill 30 day supply per Dr. Corliss Skainseveshwar

## 2017-07-31 ENCOUNTER — Other Ambulatory Visit: Payer: Self-pay

## 2017-07-31 DIAGNOSIS — Z79899 Other long term (current) drug therapy: Secondary | ICD-10-CM

## 2017-07-31 LAB — CBC WITH DIFFERENTIAL/PLATELET
BASOS PCT: 0.7 %
Basophils Absolute: 42 cells/uL (ref 0–200)
EOS ABS: 270 {cells}/uL (ref 15–500)
Eosinophils Relative: 4.5 %
HCT: 47.4 % (ref 38.5–50.0)
HEMOGLOBIN: 16.4 g/dL (ref 13.2–17.1)
LYMPHS ABS: 1620 {cells}/uL (ref 850–3900)
MCH: 31.1 pg (ref 27.0–33.0)
MCHC: 34.6 g/dL (ref 32.0–36.0)
MCV: 89.9 fL (ref 80.0–100.0)
MONOS PCT: 9.5 %
MPV: 10.9 fL (ref 7.5–12.5)
NEUTROS ABS: 3498 {cells}/uL (ref 1500–7800)
Neutrophils Relative %: 58.3 %
Platelets: 255 10*3/uL (ref 140–400)
RBC: 5.27 10*6/uL (ref 4.20–5.80)
RDW: 12.7 % (ref 11.0–15.0)
Total Lymphocyte: 27 %
WBC mixed population: 570 cells/uL (ref 200–950)
WBC: 6 10*3/uL (ref 3.8–10.8)

## 2017-07-31 LAB — COMPLETE METABOLIC PANEL WITH GFR
AG RATIO: 2 (calc) (ref 1.0–2.5)
ALT: 29 U/L (ref 9–46)
AST: 22 U/L (ref 10–40)
Albumin: 4.5 g/dL (ref 3.6–5.1)
Alkaline phosphatase (APISO): 101 U/L (ref 40–115)
BUN: 17 mg/dL (ref 7–25)
CALCIUM: 9.5 mg/dL (ref 8.6–10.3)
CO2: 24 mmol/L (ref 20–32)
CREATININE: 1.21 mg/dL (ref 0.60–1.35)
Chloride: 104 mmol/L (ref 98–110)
GFR, EST NON AFRICAN AMERICAN: 74 mL/min/{1.73_m2} (ref >=60)
GFR, Est African American: 86 mL/min/{1.73_m2} (ref >=60)
GLUCOSE: 111 mg/dL — AB (ref 65–99)
Globulin: 2.3 g/dL (calc) (ref 1.9–3.7)
POTASSIUM: 3.9 mmol/L (ref 3.5–5.3)
Sodium: 138 mmol/L (ref 135–146)
Total Bilirubin: 0.8 mg/dL (ref 0.2–1.2)
Total Protein: 6.8 g/dL (ref 6.1–8.1)

## 2017-08-01 NOTE — Progress Notes (Signed)
Labs are stable.

## 2017-08-26 ENCOUNTER — Other Ambulatory Visit: Payer: Self-pay | Admitting: Rheumatology

## 2017-08-26 NOTE — Telephone Encounter (Signed)
Last Visit: 05/21/17 Next Visit: 11/01/17 Labs: 07/31/17 Stable TB Gold: 12/19/16 Neg   Okay to refill per Dr. Corliss Skainseveshwar

## 2017-10-18 NOTE — Progress Notes (Signed)
Office Visit Note  Patient: Dennis Todd             Date of Birth: 1975-11-30           MRN: [id number]             PCP: Patient, No Pcp Per Referring: No ref. provider found Visit Date: 11/01/2017 Occupation: _0 @    Subjective:  Pain in hands    History of Present Illness: Dennis Todd is a 42 y.o. 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  Patient states that he continues to take Humira every 2 weeks.  He denies missing any doses.  He denies any recent flares.  He reports some occasional hand pain and decreased motor skills.  He denies any joint swelling.  He denies any pain in his feet.  Patient states that he continues to have yearly skin exams.  He states that he is having pain in his back.  He denies any limitation of motion.  Patient states his back muscles have been sore.  He states he is not using Robaxin, and he doesn't want a refill.     Activities of Daily Living:  Patient reports morning stiffness for 20 minutes.   Patient Denies nocturnal pain.  Difficulty dressing/grooming: Denies Difficulty climbing stairs: Reports Difficulty getting out of chair: Reports Difficulty using hands for taps, buttons, cutlery, and/or writing: Reports   Review of Systems  Constitutional: Negative for fatigue and night sweats.  HENT: Negative for mouth sores, mouth dryness and nose dryness.   Eyes: Negative for redness and dryness.  Respiratory: Negative for cough, hemoptysis, shortness of breath and difficulty breathing.   Cardiovascular: Negative for chest pain, palpitations, hypertension, irregular heartbeat and swelling in legs/feet.  Gastrointestinal: Negative for blood in stool, constipation and diarrhea.  Endocrine: Negative for increased urination.  Genitourinary: Negative for painful urination.  Musculoskeletal: Positive for arthralgias, joint pain, myalgias, morning stiffness and myalgias. Negative for joint swelling, muscle weakness and muscle tenderness.  Skin:  Negative for color change, rash, hair loss, nodules/bumps, skin tightness, ulcers and sensitivity to sunlight.  Allergic/Immunologic: Negative for susceptible to infections.  Neurological: Negative for dizziness, fainting, memory loss, night sweats and weakness.  Hematological: Negative for swollen glands.  Psychiatric/Behavioral: Negative for depressed mood and sleep disturbance. The patient is not nervous/anxious.     PMFS History:  Patient Active Problem List   Diagnosis Date Noted  . History of migraine 12/19/2016  . High risk medication use 10/20/2016  . Personal history of chronic sinusitis 10/20/2016  . Tinea versicolor 10/20/2016  . 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 Arthritis   . Migraine     Family History  Problem Relation Age of Onset  . Diabetes Father   . Rheum arthritis Brother   . Healthy Daughter    Past Surgical History:  Procedure Laterality Date  . ANKLE SURGERY Left   . NASAL SINUS SURGERY     Social History   Social History Narrative  . Not on file     Objective: Vital Signs: BP 120/79 (BP Location: Left Arm, Patient Position: Sitting, Cuff Size: Normal)   Pulse (!) 52   Resp 16   Ht _1  (1.753 m)   Wt 206 lb (93.4 kg)   BMI 30.42 kg/m    Physical Exam  Constitutional: He is oriented to person, place, and time. He appears well-developed and well-nourished.  HENT:  Head: Normocephalic and atraumatic.  Eyes: Pupils are equal, round, and reactive to light. Conjunctivae and EOM are normal.  Neck: Normal range of motion. Neck supple.  Cardiovascular: Normal rate, regular rhythm and normal heart sounds.  Pulmonary/Chest: Effort normal and breath sounds normal.  Abdominal: Soft. Bowel sounds are normal.  Neurological: He is alert and oriented to person, place, and time.  Skin: Skin is warm and dry. Capillary refill takes less than 2 seconds.  Psychiatric: He has  a normal mood and affect. His behavior is normal.  Nursing note and vitals reviewed.    Musculoskeletal Exam: C-spine, thoracic, and lumbar good ROM.  No midline spinal tenderness.  No SI joint tenderness.   Shoulder joints, elbow joints, wrist joints, MCPs, PIPs, and DIPs good ROM with no synovitis.  PIP and DIP synovial thickening consistent with osteoarthritis.  Hip joints, knee joints, ankle joints, MTPs, PIPs, and DIPs good ROM with no synovitis.  No warmth or effusion of knees.  No knee crepitus.    CDAI Exam: No CDAI exam completed.    Investigation: No additional findings. CBC Latest Ref Rng & Units 10/24/2017 07/31/2017 04/02/2017  WBC 3.8 - 10.8 Thousand/uL 8.4 6.0 6.3  Hemoglobin 13.2 - 17.1 g/dL 15.2 16.4 16.7  Hematocrit 38.5 - 50.0 % 41.9 47.4 48.0  Platelets 140 - 400 Thousand/uL 252 255 253   CMP Latest Ref Rng & Units 10/24/2017 07/31/2017 04/02/2017  Glucose 65 - 99 mg/dL 78 111(H) 107(H)  BUN 7 - 25 mg/dL _0 Creatinine 0.60 - 1.35 mg/dL 1.26 1.21 1.25  Sodium 135 - 146 mmol/L 143 138 141  Potassium 3.5 - 5.3 mmol/L 4.2 3.9 3.9  Chloride 98 - 110 mmol/L 107 104 108  CO2 20 - 32 mmol/L _1 Calcium 8.6 - 10.3 mg/dL 9.4 9.5 9.3  Total Protein 6.1 - 8.1 g/dL 6.3 6.8 6.6  Total Bilirubin 0.2 - 1.2 mg/dL 0.6 0.8 0.9  Alkaline Phos 40 - 115 U/L - - 96  AST 10 - 40 U/L _2 ALT 9 - 46 U/L _3 Imaging: No results found.  Speciality Comments: No specialty comments available.    Procedures:  No procedures performed Allergies: Patient has no known allergies.   Assessment / Plan:     Visit Diagnoses: Spondyloarthritis: He has not had any recent flares.  He has no midline spinal tenderness or SI joint tenderness.  He has some muscle tenderness of the paraspinal muscles.  He is no longer taking Robaxin.  He was given a handout of back exercises that he can perform at home.  He has no synovitis on exam. Fatigue has improved since taking Vitamin  D. He continues to take Humira every 14 days.  He does not need any refills at this time.    HLA B27 (HLA B27 positive)  High risk medication use - Humira Pen. CBC and CMP were drawn on 10/24/17. TB gold due in May 2019.  He will return for labs in June and every 3 months.  Future order for TB gold was placed. - Plan: QuantiFERON-TB Gold Plus  Other medical conditions are listed as follows:   Tinea versicolor  Personal history of chronic sinusitis  History of migraine - on Topamax    Orders: Orders Placed This Encounter  Procedures  . QuantiFERON-TB Gold Plus   No orders of the defined types were placed in this encounter.     Follow-Up Instructions:  Return in about 5 months (around 04/03/2018) for Spondyloarthritis.  [name] PA-C  I examined and evaluated the patient with [name] PA. The plan of care was discussed as noted above.  [name], MD    Note - This record has been created using Editor, commissioning.  Chart creation errors have been sought, but may not always  have been located. Such creation errors do not reflect on  the standard of medical care.

## 2017-10-23 ENCOUNTER — Ambulatory Visit: Payer: BLUE CROSS/BLUE SHIELD | Admitting: Rheumatology

## 2017-10-24 ENCOUNTER — Other Ambulatory Visit: Payer: Self-pay | Admitting: *Deleted

## 2017-10-24 ENCOUNTER — Other Ambulatory Visit: Payer: Self-pay

## 2017-10-24 DIAGNOSIS — Z79899 Other long term (current) drug therapy: Secondary | ICD-10-CM

## 2017-10-24 DIAGNOSIS — E559 Vitamin D deficiency, unspecified: Secondary | ICD-10-CM

## 2017-10-25 LAB — COMPLETE METABOLIC PANEL WITH GFR
AG RATIO: 2.3 (calc) (ref 1.0–2.5)
ALT: 24 U/L (ref 9–46)
AST: 20 U/L (ref 10–40)
Albumin: 4.4 g/dL (ref 3.6–5.1)
Alkaline phosphatase (APISO): 98 U/L (ref 40–115)
BUN: 21 mg/dL (ref 7–25)
CALCIUM: 9.4 mg/dL (ref 8.6–10.3)
CHLORIDE: 107 mmol/L (ref 98–110)
CO2: 29 mmol/L (ref 20–32)
Creat: 1.26 mg/dL (ref 0.60–1.35)
GFR, Est African American: 82 mL/min/{1.73_m2} (ref >=60)
GFR, Est Non African American: 71 mL/min/{1.73_m2} (ref >=60)
Globulin: 1.9 g/dL (calc) (ref 1.9–3.7)
Glucose, Bld: 78 mg/dL (ref 65–99)
POTASSIUM: 4.2 mmol/L (ref 3.5–5.3)
Sodium: 143 mmol/L (ref 135–146)
Total Bilirubin: 0.6 mg/dL (ref 0.2–1.2)
Total Protein: 6.3 g/dL (ref 6.1–8.1)

## 2017-10-25 LAB — CBC WITH DIFFERENTIAL/PLATELET
BASOS PCT: 0.5 %
Basophils Absolute: 42 cells/uL (ref 0–200)
EOS PCT: 3.2 %
Eosinophils Absolute: 269 cells/uL (ref 15–500)
HCT: 41.9 % (ref 38.5–50.0)
Hemoglobin: 15.2 g/dL (ref 13.2–17.1)
Lymphs Abs: 1613 cells/uL (ref 850–3900)
MCH: 31.8 pg (ref 27.0–33.0)
MCHC: 36.3 g/dL — ABNORMAL HIGH (ref 32.0–36.0)
MCV: 87.7 fL (ref 80.0–100.0)
MONOS PCT: 7.2 %
MPV: 11 fL (ref 7.5–12.5)
Neutro Abs: 5872 cells/uL (ref 1500–7800)
Neutrophils Relative %: 69.9 %
Platelets: 252 10*3/uL (ref 140–400)
RBC: 4.78 10*6/uL (ref 4.20–5.80)
RDW: 12.2 % (ref 11.0–15.0)
Total Lymphocyte: 19.2 %
WBC mixed population: 605 cells/uL (ref 200–950)
WBC: 8.4 10*3/uL (ref 3.8–10.8)

## 2017-10-25 LAB — VITAMIN D 25 HYDROXY (VIT D DEFICIENCY, FRACTURES): VIT D 25 HYDROXY: 39 ng/mL (ref 30–100)

## 2017-11-01 ENCOUNTER — Ambulatory Visit (INDEPENDENT_AMBULATORY_CARE_PROVIDER_SITE_OTHER): Payer: 59 | Admitting: Physician Assistant

## 2017-11-01 ENCOUNTER — Encounter: Payer: Self-pay | Admitting: Physician Assistant

## 2017-11-01 VITALS — BP 120/79 | HR 52 | Resp 16 | Ht 69.0 in | Wt 206.0 lb

## 2017-11-01 DIAGNOSIS — Z8709 Personal history of other diseases of the respiratory system: Secondary | ICD-10-CM

## 2017-11-01 DIAGNOSIS — Z8669 Personal history of other diseases of the nervous system and sense organs: Secondary | ICD-10-CM

## 2017-11-01 DIAGNOSIS — Z1589 Genetic susceptibility to other disease: Secondary | ICD-10-CM | POA: Diagnosis not present

## 2017-11-01 DIAGNOSIS — B360 Pityriasis versicolor: Secondary | ICD-10-CM

## 2017-11-01 DIAGNOSIS — Z79899 Other long term (current) drug therapy: Secondary | ICD-10-CM | POA: Diagnosis not present

## 2017-11-01 DIAGNOSIS — M47819 Spondylosis without myelopathy or radiculopathy, site unspecified: Secondary | ICD-10-CM | POA: Diagnosis not present

## 2017-11-01 NOTE — Patient Instructions (Addendum)
Back Exercises If you have pain in your back, do these exercises 2-3 times each day or as told by your doctor. When the pain goes away, do the exercises once each day, but repeat the steps more times for each exercise (do more repetitions). If you do not have pain in your back, do these exercises once each day or as told by your doctor. Exercises Single Knee to Chest  Do these steps 3-5 times in a row for each leg: 1. Lie on your back on a firm bed or the floor with your legs stretched out. 2. Bring one knee to your chest. 3. Hold your knee to your chest by grabbing your knee or thigh. 4. Pull on your knee until you feel a gentle stretch in your lower back. 5. Keep doing the stretch for 10-30 seconds. 6. Slowly let go of your leg and straighten it.  Pelvic Tilt  Do these steps 5-10 times in a row: 1. Lie on your back on a firm bed or the floor with your legs stretched out. 2. Bend your knees so they point up to the ceiling. Your feet should be flat on the floor. 3. Tighten your lower belly (abdomen) muscles to press your lower back against the floor. This will make your tailbone point up to the ceiling instead of pointing down to your feet or the floor. 4. Stay in this position for 5-10 seconds while you gently tighten your muscles and breathe evenly.  Cat-Cow  Do these steps until your lower back bends more easily: 1. Get on your hands and knees on a firm surface. Keep your hands under your shoulders, and keep your knees under your hips. You may put padding under your knees. 2. Let your head hang down, and make your tailbone point down to the floor so your lower back is round like the back of a cat. 3. Stay in this position for 5 seconds. 4. Slowly lift your head and make your tailbone point up to the ceiling so your back hangs low (sags) like the back of a cow. 5. Stay in this position for 5 seconds.  Press-Ups  Do these steps 5-10 times in a row: 1. Lie on your belly (face-down)  on the floor. 2. Place your hands near your head, about shoulder-width apart. 3. While you keep your back relaxed and keep your hips on the floor, slowly straighten your arms to raise the top half of your body and lift your shoulders. Do not use your back muscles. To make yourself more comfortable, you may change where you place your hands. 4. Stay in this position for 5 seconds. 5. Slowly return to lying flat on the floor.  Bridges  Do these steps 10 times in a row: 1. Lie on your back on a firm surface. 2. Bend your knees so they point up to the ceiling. Your feet should be flat on the floor. 3. Tighten your butt muscles and lift your butt off of the floor until your waist is almost as high as your knees. If you do not feel the muscles working in your butt and the back of your thighs, slide your feet 1-2 inches farther away from your butt. 4. Stay in this position for 3-5 seconds. 5. Slowly lower your butt to the floor, and let your butt muscles relax.  If this exercise is too easy, try doing it with your arms crossed over your chest. Belly Crunches  Do these steps 5-10 times in   a row: 1. Lie on your back on a firm bed or the floor with your legs stretched out. 2. Bend your knees so they point up to the ceiling. Your feet should be flat on the floor. 3. Cross your arms over your chest. 4. Tip your chin a little bit toward your chest but do not bend your neck. 5. Tighten your belly muscles and slowly raise your chest just enough to lift your shoulder blades a tiny bit off of the floor. 6. Slowly lower your chest and your head to the floor.  Back Lifts Do these steps 5-10 times in a row: 1. Lie on your belly (face-down) with your arms at your sides, and rest your forehead on the floor. 2. Tighten the muscles in your legs and your butt. 3. Slowly lift your chest off of the floor while you keep your hips on the floor. Keep the back of your head in line with the curve in your back. Look at  the floor while you do this. 4. Stay in this position for 3-5 seconds. 5. Slowly lower your chest and your face to the floor.  Contact a doctor if:  Your back pain gets a lot worse when you do an exercise.  Your back pain does not lessen 2 hours after you exercise. If you have any of these problems, stop doing the exercises. Do not do them again unless your doctor says it is okay. Get help right away if:  You have sudden, very bad back pain. If this happens, stop doing the exercises. Do not do them again unless your doctor says it is okay. This information is not intended to replace advice given to you by your health care provider. Make sure you discuss any questions you have with your health care provider. Document Released: 09/01/2010 Document Revised: 01/05/2016 Document Reviewed: 09/23/2014 Elsevier Interactive Patient Education  2018 [employer]   [facility] We placed an order today for your standing lab work.    Please come back and get your standing labs in June and every 3 months following   We have open lab Monday through Friday from 8:30-11:30 AM and 1:30-4:00 PM  at the office of Dr. Pollyann SavoyShaili Deveshwar.   You may experience shorter wait times on Monday and Friday afternoons. The office is located at [address], [address], [place], [place] [address] No appointment is necessary.   Labs are drawn by [facility].  You may receive a bill from [facility] for your lab work. If you have any questions regarding directions or hours of operation,  please call [phone].

## 2017-11-11 ENCOUNTER — Other Ambulatory Visit: Payer: Self-pay | Admitting: Rheumatology

## 2017-11-11 NOTE — Telephone Encounter (Signed)
Last Visit: 11/01/17 Next visit: 04/10/18 Labs: 10/24/17 WNL TB Gold: 12/19/16 Neg   Okay to refill per Dr. Corliss Skainseveshwar

## 2017-11-13 ENCOUNTER — Telehealth: Payer: Self-pay | Admitting: Rheumatology

## 2017-11-13 NOTE — Telephone Encounter (Signed)
Michael from [facility] called stating they need a prescription faxed or verbal confirmation changing from Humira with citrate to Humira without citrate.  Please fax #[phone]  Or verbal confirmation #[id number]

## 2017-11-14 NOTE — Telephone Encounter (Signed)
Contacted pharmacy and advised patient is switching to citrate free humira.

## 2018-02-15 ENCOUNTER — Other Ambulatory Visit: Payer: Self-pay | Admitting: Rheumatology

## 2018-02-17 NOTE — Telephone Encounter (Signed)
Last visit: 11/01/2017 Next visit: 04/10/2018 Labs: 10/24/2017 WNL TB Gold: 12/19/2016 Negative   Advised patient he is due for labs. Patient states he is working out of town and will come in next week to have labs drawn. I advised patient we could send in a 30 day supply and patient verbalized understanding.   Okay to refill per Dr. Corliss Skainseveshwar.

## 2018-02-28 ENCOUNTER — Other Ambulatory Visit: Payer: Self-pay

## 2018-02-28 ENCOUNTER — Other Ambulatory Visit: Payer: Self-pay | Admitting: *Deleted

## 2018-02-28 DIAGNOSIS — Z79899 Other long term (current) drug therapy: Secondary | ICD-10-CM

## 2018-03-02 LAB — COMPLETE METABOLIC PANEL WITH GFR
AG RATIO: 1.9 (calc) (ref 1.0–2.5)
ALBUMIN MSPROF: 4.3 g/dL (ref 3.6–5.1)
ALT: 28 U/L (ref 9–46)
AST: 22 U/L (ref 10–40)
Alkaline phosphatase (APISO): 82 U/L (ref 40–115)
BUN: 21 mg/dL (ref 7–25)
CALCIUM: 9.4 mg/dL (ref 8.6–10.3)
CO2: 26 mmol/L (ref 20–32)
Chloride: 106 mmol/L (ref 98–110)
Creat: 1.06 mg/dL (ref 0.60–1.35)
GFR, EST NON AFRICAN AMERICAN: 87 mL/min/{1.73_m2} (ref >=60)
GFR, Est African American: 101 mL/min/{1.73_m2} (ref >=60)
GLOBULIN: 2.3 g/dL (ref 1.9–3.7)
Glucose, Bld: 110 mg/dL — ABNORMAL HIGH (ref 65–99)
POTASSIUM: 4.4 mmol/L (ref 3.5–5.3)
SODIUM: 141 mmol/L (ref 135–146)
TOTAL PROTEIN: 6.6 g/dL (ref 6.1–8.1)
Total Bilirubin: 0.7 mg/dL (ref 0.2–1.2)

## 2018-03-02 LAB — CBC WITH DIFFERENTIAL/PLATELET
Basophils Absolute: 39 cells/uL (ref 0–200)
Basophils Relative: 0.6 %
EOS ABS: 319 {cells}/uL (ref 15–500)
Eosinophils Relative: 4.9 %
HCT: 46.6 % (ref 38.5–50.0)
Hemoglobin: 16.4 g/dL (ref 13.2–17.1)
Lymphs Abs: 1768 cells/uL (ref 850–3900)
MCH: 31.4 pg (ref 27.0–33.0)
MCHC: 35.2 g/dL (ref 32.0–36.0)
MCV: 89.1 fL (ref 80.0–100.0)
MONOS PCT: 9 %
MPV: 10.9 fL (ref 7.5–12.5)
Neutro Abs: 3790 cells/uL (ref 1500–7800)
Neutrophils Relative %: 58.3 %
Platelets: 237 10*3/uL (ref 140–400)
RBC: 5.23 10*6/uL (ref 4.20–5.80)
RDW: 12.9 % (ref 11.0–15.0)
TOTAL LYMPHOCYTE: 27.2 %
WBC mixed population: 585 cells/uL (ref 200–950)
WBC: 6.5 10*3/uL (ref 3.8–10.8)

## 2018-03-02 LAB — QUANTIFERON-TB GOLD PLUS
NIL: 0.05 IU/mL
QuantiFERON-TB Gold Plus: NEGATIVE
TB1-NIL: 0.01 IU/mL
TB2-NIL: 0.01 [IU]/mL

## 2018-03-03 NOTE — Progress Notes (Signed)
TB gold negative

## 2018-03-09 ENCOUNTER — Other Ambulatory Visit: Payer: Self-pay | Admitting: Rheumatology

## 2018-03-10 NOTE — Telephone Encounter (Signed)
Last visit: 11/01/2017 Next visit: 04/10/2018 Labs: 7/19/19Glucose 110. All other labs are WNL.  TB Gold: 02/28/18 Neg   Okay to refill per Dr. Corliss Skainseveshwar

## 2018-03-27 NOTE — Progress Notes (Signed)
 Office Visit Note  Patient: Dennis Todd             Date of Birth: 10/26/1975           MRN: [id number]             PCP: Patient, No Pcp Per Referring: No ref. provider found Visit Date: 04/10/2018 Occupation: @GUAROCC@  Subjective:  Lower back pain   History of Present Illness: Dennis Todd is a 42 y.o. 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  He is injecting Humira every 14 days.  He takes Mobic 15 mg po daily for pain relief.  Patient reports that he has been having bilateral SI joint pain.  He states the pain is most severe if he is standing for longer than 5 minutes.  He states that his back gets very stiff at work if he is leaning over for long peers of time.  He states that he has bilateral hand pain when he is doing fine motor movements at work.  He denies any joint swelling.  He denies any other joint pain or joint swelling at this time.    Activities of Daily Living:  Patient reports morning stiffness for 1  hour.   Patient Reports nocturnal pain.  Difficulty dressing/grooming: Denies Difficulty climbing stairs: Reports Difficulty getting out of chair: Reports Difficulty using hands for taps, buttons, cutlery, and/or writing: Denies  Review of Systems  Constitutional: Negative for fatigue and night sweats.  HENT: Negative for mouth sores, mouth dryness and nose dryness.   Eyes: Positive for dryness. Negative for redness and visual disturbance.  Respiratory: Negative for cough, hemoptysis, shortness of breath and difficulty breathing.   Cardiovascular: Negative for chest pain, palpitations, hypertension, irregular heartbeat and swelling in legs/feet.  Gastrointestinal: Negative for blood in stool, constipation and diarrhea.  Endocrine: Negative for increased urination.  Genitourinary: Negative for painful urination.  Musculoskeletal: Positive for arthralgias, joint pain and morning stiffness. Negative for joint swelling, myalgias, muscle weakness, muscle tenderness  and myalgias.  Skin: Negative for color change, rash, hair loss, nodules/bumps, skin tightness, ulcers and sensitivity to sunlight.  Allergic/Immunologic: Negative for susceptible to infections.  Neurological: Negative for dizziness, fainting, memory loss, night sweats and weakness.  Hematological: Negative for swollen glands.  Psychiatric/Behavioral: Negative for depressed mood and sleep disturbance. The patient is not nervous/anxious.     PMFS History:  Patient Active Problem List   Diagnosis Date Noted  . History of migraine 12/19/2016  . High risk medication use 10/20/2016  . Personal history of chronic sinusitis 10/20/2016  . Tinea versicolor 10/20/2016  . 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 Arthritis   . Migraine     Family History  Problem Relation Age of Onset  . Diabetes Father   . Rheum arthritis Brother   . Healthy Daughter    Past Surgical History:  Procedure Laterality Date  . ANKLE SURGERY Left   . NASAL SINUS SURGERY     Social History   Social History Narrative  . Not on file    Objective: Vital Signs: BP 126/89 (BP Location: Left Arm, Patient Position: Sitting, Cuff Size: Normal)   Pulse (!) 57   Resp 13   Ht 5' 9" (1.753 m)   Wt 217 lb 12.8 oz (98.8 kg)   BMI 32.16 kg/m    Physical Exam  Constitutional: He is oriented to person, place, and time. He   appears well-developed and well-nourished.  HENT:  Head: Normocephalic and atraumatic.  Eyes: Pupils are equal, round, and reactive to light. Conjunctivae and EOM are normal.  Neck: Normal range of motion. Neck supple.  Cardiovascular: Normal rate, regular rhythm and normal heart sounds.  Pulmonary/Chest: Effort normal and breath sounds normal.  Abdominal: Soft. Bowel sounds are normal.  Lymphadenopathy:    He has no cervical adenopathy.  Neurological: He is alert and oriented to person, place, and time.  Skin: Skin  is warm and dry. Capillary refill takes less than 2 seconds.  Psychiatric: He has a normal mood and affect. His behavior is normal.  Nursing note and vitals reviewed.    Musculoskeletal Exam: C-spine limited ROM. Thoracic and lumbar spine good ROM.  No midline spinal tenderness. Bilateral SI joint tenderness. Shoulder joints, elbow joints, wrist joints, MCPs, PIPs, and DIPs good ROM with no synovitis.  PIP and DIP synovial thickening.  Hip joints, knee joints, ankle joints, MTPs, PIPs, and DIPs good ROM with no synovitis. No warmth or effusion of knee joints.  No achilles tendonitis or plantar fasciitis.  No tenderness of trochanteric bursa bilaterally.    CDAI Exam: CDAI Score: Not documented Patient Global Assessment: Not documented; Provider Global Assessment: Not documented Swollen: Not documented; Tender: Not documented Joint Exam   Not documented   There is currently no information documented on the homunculus. Go to the Rheumatology activity and complete the homunculus joint exam.  Investigation: No additional findings.  Imaging: No results found.  Recent Labs: Lab Results  Component Value Date   WBC 6.5 02/28/2018   HGB 16.4 02/28/2018   PLT 237 02/28/2018   NA 141 02/28/2018   K 4.4 02/28/2018   CL 106 02/28/2018   CO2 26 02/28/2018   GLUCOSE 110 (H) 02/28/2018   BUN 21 02/28/2018   CREATININE 1.06 02/28/2018   BILITOT 0.7 02/28/2018   ALKPHOS 96 04/02/2017   AST 22 02/28/2018   ALT 28 02/28/2018   PROT 6.6 02/28/2018   ALBUMIN 4.5 04/02/2017   CALCIUM 9.4 02/28/2018   GFRAA 101 02/28/2018   QFTBGOLDPLUS NEGATIVE 02/28/2018    Speciality Comments: No specialty comments available.  Procedures:  Sacroiliac Joint Inj on 04/10/2018 8:25 AM Indications: pain Details: 27 G 1.5 in needle, posterior approach Medications (Right): 1 mL lidocaine 1 %; 40 mg triamcinolone acetonide 40 MG/ML Aspirate (Right): 0 mL Medications (Left): 1 mL lidocaine 1 %; 40 mg  triamcinolone acetonide 40 MG/ML Aspirate (Left): 0 mL Outcome: tolerated well, no immediate complications Procedure, treatment alternatives, risks and benefits explained, specific risks discussed. Consent was given by the patient. Immediately prior to procedure a time out was called to verify the correct patient, procedure, equipment, support staff and site/side marked as required. Patient was prepped and draped in the usual sterile fashion.     Allergies: Patient has no known allergies.   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  He has good range of motion of thoracic and lumbar spine.  He has no midline spinal tenderness.  He requested bilateral SI joint injections today.  He tolerated the procedure well.  He was advised to monitor his blood pressure closely upon the cortisone injection today.  He was given a handout of back exercises he can perform at home.  He will continue injecting Humira subcutaneously every 14 days.  He does not need a refill or lab work at this time.  He   was advised to notify us if he develops any new or worsening symptoms.  He will follow-up in 5 months.  HLA B27 (HLA B27 positive)  High risk medication use - Humira sq inj every 14 days.  TB gold negative on 02/28/2018.  CBC and CMP were drawn on 02/28/2018.  He will return in October and every 3 months for lab work to monitor for drug toxicity.  Future orders are in place.  Chronic SI joint pain: He has bilateral SI joint tenderness on exam today.  He requested bilateral cortisone injections.  He tolerated procedure well.  Other medical conditions are listed as follows:  Tinea versicolor  Vitamin D deficiency  History of migraine  Personal history of chronic sinusitis  Other fatigue   Orders: Orders Placed This Encounter  Procedures  . Sacroiliac Joint Inj   No orders of the defined types were placed in this encounter.   Face-to-face  time spent with patient was 30 minutes. Greater than 50% of time was spent in counseling and coordination of care.  Follow-Up Instructions: Return in about 5 months (around 09/10/2018) for Spondyloarthritis .    M , PA-C  Note - This record has been created using Dragon software.  Chart creation errors have been sought, but may not always  have been located. Such creation errors do not reflect on  the standard of medical care. 

## 2018-04-01 DIAGNOSIS — M545 Low back pain, unspecified: Secondary | ICD-10-CM | POA: Insufficient documentation

## 2018-04-01 DIAGNOSIS — G8929 Other chronic pain: Secondary | ICD-10-CM | POA: Insufficient documentation

## 2018-04-07 NOTE — Patient Instructions (Addendum)
Standing Labs We placed an order today for your standing lab work.    Please come back and get your standing labs in October and then every 3 months.  We have open lab Monday through Friday from 8:30-11:30 AM and 1:30-4:00 PM  at the office of Dr. Pollyann Savoy.   You may experience shorter wait times on Monday and Friday afternoons. The office is located at [address], [place], [place] [address] No appointment is necessary.   Labs are drawn by [facility].  You may receive a bill from [facility] for your lab work. If you have any questions regarding directions or hours of operation,  please call [phone].    Vaccines You are taking a medication(s) that can suppress your immune system.  The following immunizations are recommended: . Flu annually . Pneumonia . Shingles . Hepatitis B  Please check with your PCP to make sure you are up to date.  Back Exercises If you have pain in your back, do these exercises 2-3 times each day or as told by your doctor. When the pain goes away, do the exercises once each day, but repeat the steps more times for each exercise (do more repetitions). If you do not have pain in your back, do these exercises once each day or as told by your doctor. Exercises Single Knee to Chest  Do these steps 3-5 times in a row for each leg: 1. Lie on your back on a firm bed or the floor with your legs stretched out. 2. Bring one knee to your chest. 3. Hold your knee to your chest by grabbing your knee or thigh. 4. Pull on your knee until you feel a gentle stretch in your lower back. 5. Keep doing the stretch for 10-30 seconds. 6. Slowly let go of your leg and straighten it.  Pelvic Tilt  Do these steps 5-10 times in a row: 1. Lie on your back on a firm bed or the floor with your legs stretched out. 2. Bend your knees so they point up to the ceiling. Your feet should be flat on the floor. 3. Tighten your lower belly (abdomen) muscles to press your  lower back against the floor. This will make your tailbone point up to the ceiling instead of pointing down to your feet or the floor. 4. Stay in this position for 5-10 seconds while you gently tighten your muscles and breathe evenly.  Cat-Cow  Do these steps until your lower back bends more easily: 1. Get on your hands and knees on a firm surface. Keep your hands under your shoulders, and keep your knees under your hips. You may put padding under your knees. 2. Let your head hang down, and make your tailbone point down to the floor so your lower back is round like the back of a cat. 3. Stay in this position for 5 seconds. 4. Slowly lift your head and make your tailbone point up to the ceiling so your back hangs low (sags) like the back of a cow. 5. Stay in this position for 5 seconds.  Press-Ups  Do these steps 5-10 times in a row: 1. Lie on your belly (face-down) on the floor. 2. Place your hands near your head, about shoulder-width apart. 3. While you keep your back relaxed and keep your hips on the floor, slowly straighten your arms to raise the top half of your body and lift your shoulders. Do not use your back muscles. To make yourself more comfortable,  you may change where you place your hands. 4. Stay in this position for 5 seconds. 5. Slowly return to lying flat on the floor.  Bridges  Do these steps 10 times in a row: 1. Lie on your back on a firm surface. 2. Bend your knees so they point up to the ceiling. Your feet should be flat on the floor. 3. Tighten your butt muscles and lift your butt off of the floor until your waist is almost as high as your knees. If you do not feel the muscles working in your butt and the back of your thighs, slide your feet 1-2 inches farther away from your butt. 4. Stay in this position for 3-5 seconds. 5. Slowly lower your butt to the floor, and let your butt muscles relax.  If this exercise is too easy, try doing it with your arms crossed over  your chest. Belly Crunches  Do these steps 5-10 times in a row: 1. Lie on your back on a firm bed or the floor with your legs stretched out. 2. Bend your knees so they point up to the ceiling. Your feet should be flat on the floor. 3. Cross your arms over your chest. 4. Tip your chin a little bit toward your chest but do not bend your neck. 5. Tighten your belly muscles and slowly raise your chest just enough to lift your shoulder blades a tiny bit off of the floor. 6. Slowly lower your chest and your head to the floor.  Back Lifts Do these steps 5-10 times in a row: 1. Lie on your belly (face-down) with your arms at your sides, and rest your forehead on the floor. 2. Tighten the muscles in your legs and your butt. 3. Slowly lift your chest off of the floor while you keep your hips on the floor. Keep the back of your head in line with the curve in your back. Look at the floor while you do this. 4. Stay in this position for 3-5 seconds. 5. Slowly lower your chest and your face to the floor.  Contact a doctor if:  Your back pain gets a lot worse when you do an exercise.  Your back pain does not lessen 2 hours after you exercise. If you have any of these problems, stop doing the exercises. Do not do them again unless your doctor says it is okay. Get help right away if:  You have sudden, very bad back pain. If this happens, stop doing the exercises. Do not do them again unless your doctor says it is okay. This information is not intended to replace advice given to you by your health care provider. Make sure you discuss any questions you have with your health care provider. Document Released: 09/01/2010 Document Revised: 01/05/2016 Document Reviewed: 09/23/2014 Elsevier Interactive Patient Education  [employer]2018 [employer]

## 2018-04-10 ENCOUNTER — Ambulatory Visit (INDEPENDENT_AMBULATORY_CARE_PROVIDER_SITE_OTHER): Payer: 59 | Admitting: Physician Assistant

## 2018-04-10 ENCOUNTER — Encounter: Payer: Self-pay | Admitting: Physician Assistant

## 2018-04-10 VITALS — BP 126/89 | HR 57 | Resp 13 | Ht 69.0 in | Wt 217.8 lb

## 2018-04-10 DIAGNOSIS — M47819 Spondylosis without myelopathy or radiculopathy, site unspecified: Secondary | ICD-10-CM

## 2018-04-10 DIAGNOSIS — E559 Vitamin D deficiency, unspecified: Secondary | ICD-10-CM

## 2018-04-10 DIAGNOSIS — Z79899 Other long term (current) drug therapy: Secondary | ICD-10-CM

## 2018-04-10 DIAGNOSIS — R5383 Other fatigue: Secondary | ICD-10-CM

## 2018-04-10 DIAGNOSIS — Z1589 Genetic susceptibility to other disease: Secondary | ICD-10-CM | POA: Diagnosis not present

## 2018-04-10 DIAGNOSIS — Z8709 Personal history of other diseases of the respiratory system: Secondary | ICD-10-CM

## 2018-04-10 DIAGNOSIS — G8929 Other chronic pain: Secondary | ICD-10-CM

## 2018-04-10 DIAGNOSIS — B360 Pityriasis versicolor: Secondary | ICD-10-CM

## 2018-04-10 DIAGNOSIS — Z8669 Personal history of other diseases of the nervous system and sense organs: Secondary | ICD-10-CM

## 2018-04-10 DIAGNOSIS — M533 Sacrococcygeal disorders, not elsewhere classified: Secondary | ICD-10-CM | POA: Diagnosis not present

## 2018-04-10 MED ORDER — TRIAMCINOLONE ACETONIDE 40 MG/ML IJ SUSP
40.0000 mg | INTRAMUSCULAR | Status: AC | PRN
  Administered 2018-04-10: 40 mg via INTRA_ARTICULAR

## 2018-04-10 MED ORDER — LIDOCAINE HCL 1 % IJ SOLN
1.0000 mL | INTRAMUSCULAR | Status: AC | PRN
  Administered 2018-04-10: 1 mL

## 2018-07-14 ENCOUNTER — Other Ambulatory Visit: Payer: Self-pay | Admitting: Rheumatology

## 2018-07-14 NOTE — Telephone Encounter (Signed)
Last Visit: 04/10/18 Next visit: 09/24/18 Labs: 02/28/18 Glucose 110. All other labs are WNL. TB gold: 02/28/18 Neg   Patient advised he is due to update labs. patient will update 07/15/18.   Okay to refill 30 day supply per Dr. Corliss Skainseveshwar

## 2018-07-15 ENCOUNTER — Other Ambulatory Visit: Payer: Self-pay | Admitting: Rheumatology

## 2018-07-15 DIAGNOSIS — Z79899 Other long term (current) drug therapy: Secondary | ICD-10-CM

## 2018-07-16 LAB — COMPLETE METABOLIC PANEL WITH GFR
AG RATIO: 1.9 (calc) (ref 1.0–2.5)
ALT: 57 U/L — AB (ref 9–46)
AST: 36 U/L (ref 10–40)
Albumin: 4.3 g/dL (ref 3.6–5.1)
Alkaline phosphatase (APISO): 78 U/L (ref 40–115)
BUN: 21 mg/dL (ref 7–25)
CALCIUM: 9.4 mg/dL (ref 8.6–10.3)
CO2: 29 mmol/L (ref 20–32)
CREATININE: 1.11 mg/dL (ref 0.60–1.35)
Chloride: 104 mmol/L (ref 98–110)
GFR, EST AFRICAN AMERICAN: 95 mL/min/{1.73_m2} (ref >=60)
GFR, EST NON AFRICAN AMERICAN: 82 mL/min/{1.73_m2} (ref >=60)
GLUCOSE: 101 mg/dL — AB (ref 65–99)
Globulin: 2.3 g/dL (calc) (ref 1.9–3.7)
Potassium: 4.5 mmol/L (ref 3.5–5.3)
Sodium: 140 mmol/L (ref 135–146)
Total Bilirubin: 0.6 mg/dL (ref 0.2–1.2)
Total Protein: 6.6 g/dL (ref 6.1–8.1)

## 2018-07-16 LAB — CBC WITH DIFFERENTIAL/PLATELET
BASOS ABS: 38 {cells}/uL (ref 0–200)
BASOS PCT: 0.5 %
Eosinophils Absolute: 263 cells/uL (ref 15–500)
Eosinophils Relative: 3.5 %
HEMATOCRIT: 48.1 % (ref 38.5–50.0)
HEMOGLOBIN: 16.9 g/dL (ref 13.2–17.1)
LYMPHS ABS: 2100 {cells}/uL (ref 850–3900)
MCH: 31.8 pg (ref 27.0–33.0)
MCHC: 35.1 g/dL (ref 32.0–36.0)
MCV: 90.4 fL (ref 80.0–100.0)
MPV: 10.7 fL (ref 7.5–12.5)
Monocytes Relative: 8.8 %
NEUTROS ABS: 4440 {cells}/uL (ref 1500–7800)
Neutrophils Relative %: 59.2 %
Platelets: 245 10*3/uL (ref 140–400)
RBC: 5.32 10*6/uL (ref 4.20–5.80)
RDW: 12.7 % (ref 11.0–15.0)
Total Lymphocyte: 28 %
WBC: 7.5 10*3/uL (ref 3.8–10.8)
WBCMIX: 660 {cells}/uL (ref 200–950)

## 2018-07-16 NOTE — Progress Notes (Signed)
LFTs are mildly elevated.  Patient was taking meloxicam.  Please advise avoid use of all NSAIDs and alcohol.

## 2018-08-04 ENCOUNTER — Telehealth: Payer: Self-pay | Admitting: *Deleted

## 2018-08-04 NOTE — Telephone Encounter (Signed)
Patient advised that Dennis Todd has been trying to contact him to fill his Humira. Patient states he will contact them today.

## 2018-08-07 NOTE — Telephone Encounter (Signed)
Received a Prior Authorization request from [facility] for Humira. Authorization has been submitted to patient's insurance via Cover My Meds. Will update once we receive a response. 

## 2018-08-08 NOTE — Telephone Encounter (Signed)
Received a fax from [facility] regarding a prior authorization for Humira. Authorization has been APPROVED from 08/07/18 to 08/07/20.   Will send document to scan center.  Authorization # [id number]  Phone # [phone]  8:11 AM Dorthula Nettlesachael N Capers Hagmann, CPhT

## 2018-09-02 ENCOUNTER — Other Ambulatory Visit: Payer: Self-pay | Admitting: Rheumatology

## 2018-09-02 NOTE — Telephone Encounter (Signed)
Last Visit: 04/10/18 Next visit: 09/24/18 Labs:07/15/18 LFTs are mildly elevated TB gold: 02/28/18 Neg   Okay to refill per Dr. Corliss Skains

## 2018-09-10 NOTE — Progress Notes (Signed)
Office Visit Note  Patient: Dennis Todd             Date of Birth: [birth year]           MRN: [id number]             PCP: Patient, No Pcp Per Referring: No ref. provider found Visit Date: 09/24/2018 Occupation: _0 @  Subjective:  Lower back pain   History of Present Illness: Dennis Todd is a 43 y.o. 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  He is on Humira 40 mg sq injections every 14 days.  He has not missed any doses recently. He has not had any recent infections.  He continues to get yearly skin exam.  He continues to have chronic mid-thoracic and lower back pain.  He reports intermittent SI joint pain bilaterally.  He denies any radiating pain or numbness.  He has intermittent hand pain and difficulty with fine motor movement.  He denies any joint swelling. He denies any achilles tendonitis or plantar fasciitis.  He denies any rashes.  He denies any eye inflammation.  He takes Mobic 15 mg po daily PRN for pain relief.  He reports he is going to [place] this summer, and he is curious if he needs any additional vaccinations.    Activities of Daily Living:  Patient reports morning stiffness for 30 minutes.   Patient Reports nocturnal pain.  Difficulty dressing/grooming: Denies Difficulty climbing stairs: Reports Difficulty getting out of chair: Reports Difficulty using hands for taps, buttons, cutlery, and/or writing: Reports  Review of Systems  Constitutional: Positive for fatigue. Negative for night sweats.  HENT: Negative for mouth sores, mouth dryness and nose dryness.   Eyes: Negative for redness, visual disturbance and dryness.  Respiratory: Negative for cough, hemoptysis, shortness of breath and difficulty breathing.   Cardiovascular: Negative for chest pain, palpitations, hypertension, irregular heartbeat and swelling in legs/feet.  Gastrointestinal: Negative for blood in stool, constipation and diarrhea.  Endocrine: Negative for excessive thirst and increased  urination.  Genitourinary: Negative for difficulty urinating and painful urination.  Musculoskeletal: Positive for arthralgias, joint pain and morning stiffness. Negative for joint swelling, myalgias, muscle weakness, muscle tenderness and myalgias.  Skin: Negative for color change, rash, hair loss, nodules/bumps, skin tightness, ulcers and sensitivity to sunlight.  Allergic/Immunologic: Negative for susceptible to infections.  Neurological: Negative for dizziness, fainting, memory loss, night sweats and weakness.  Hematological: Negative for bruising/bleeding tendency and swollen glands.  Psychiatric/Behavioral: Negative for depressed mood and sleep disturbance. The patient is not nervous/anxious.     PMFS History:  Patient Active Problem List   Diagnosis Date Noted  . History of migraine 12/19/2016  . High risk medication use 10/20/2016  . Personal history of chronic sinusitis 10/20/2016  . Tinea versicolor 10/20/2016  . 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 Arthritis   . Migraine     Family History  Problem Relation Age of Onset  . Diabetes Father   . Rheum arthritis Brother   . Healthy Daughter    Past Surgical History:  Procedure Laterality Date  . ANKLE SURGERY Left   . NASAL SINUS SURGERY     Social History   Social History Narrative  . Not on file    There is no immunization history on file for this patient.   Objective: Vital Signs: BP 114/63 (BP Location: Left Arm, Patient Position: Sitting, Cuff Size: Normal)  Pulse (!) 59   Resp 14   Ht _0  (1.753 m)   Wt 230 lb 6.4 oz (104.5 kg)   BMI 34.02 kg/m    Physical Exam Vitals signs and nursing note reviewed.  Constitutional:      Appearance: He is well-developed.  HENT:     Head: Normocephalic and atraumatic.  Eyes:     Conjunctiva/sclera: Conjunctivae normal.     Pupils: Pupils are equal, round, and reactive to light.    Neck:     Musculoskeletal: Normal range of motion and neck supple.  Cardiovascular:     Rate and Rhythm: Normal rate and regular rhythm.     Heart sounds: Normal heart sounds.  Pulmonary:     Effort: Pulmonary effort is normal.     Breath sounds: Normal breath sounds.  Abdominal:     General: Bowel sounds are normal.     Palpations: Abdomen is soft.  Lymphadenopathy:     Cervical: No cervical adenopathy.  Skin:    General: Skin is warm and dry.     Capillary Refill: Capillary refill takes less than 2 seconds.  Neurological:     Mental Status: He is alert and oriented to person, place, and time.  Psychiatric:        Behavior: Behavior normal.      Musculoskeletal Exam: C-spine, thoracic, and lumbar spine limited ROM with discomfort.  Midline spinal tenderness in the mid-thoracic and SI joints.  Shoulder joints, elbow joints, wrist joints, MCPs, PIPs, and DIPs good ROM with no synovitis.  He has PIP and DIP synovial thickening consistent with osteoarthritis.  Complete fist formation bilaterally.  Hip joints, knee joints, ankle joints, MTPs, PIPs,and DIPs good ROM with no synovitis.  No warmth or effusion of knee joints.  No tenderness or swelling of ankle joints.  No tenderness over trochanteric bursa.   CDAI Exam: CDAI Score: 0.8  Patient Global Assessment: 4 (mm); Provider Global Assessment: 4 (mm) Swollen: 0 ; Tender: 0  Joint Exam   Not documented   There is currently no information documented on the homunculus. Go to the Rheumatology activity and complete the homunculus joint exam.  Investigation: No additional findings.  Imaging: No results found.  Recent Labs: Lab Results  Component Value Date   WBC 7.5 07/15/2018   HGB 16.9 07/15/2018   PLT 245 07/15/2018   NA 140 07/15/2018   K 4.5 07/15/2018   CL 104 07/15/2018   CO2 29 07/15/2018   GLUCOSE 101 (H) 07/15/2018   BUN 21 07/15/2018   CREATININE 1.11 07/15/2018   BILITOT 0.6 07/15/2018   ALKPHOS 96  04/02/2017   AST 36 07/15/2018   ALT 57 (H) 07/15/2018   PROT 6.6 07/15/2018   ALBUMIN 4.5 04/02/2017   CALCIUM 9.4 07/15/2018   GFRAA 95 07/15/2018   QFTBGOLDPLUS NEGATIVE 02/28/2018    Speciality Comments: No specialty comments available.  Procedures:  No procedures performed Allergies: Patient has no known allergies.   Assessment / Plan:     Visit Diagnoses: Spondyloarthritis: He has bilateral SI joint tenderness and midline mid-thoracic tenderness.  He has slightly limited ROM of the C-spine, thoracic spine, and lumbar spine. He has no synovitis on exam.  He has no rashes, achilles tendonitis, or plantar fasciitis.  He has no eye inflammation at this time.  He is clinically doing well on Humira 40 mg sq injections every 14 days.  He has not missed any doses recently.  He has not had any recent  infections.  He continues to get yearly skin exam.  He continues to take Mobic 15 mg po daily PRN for pain relief. He does not need any refills at this time. He will continue on this current treatment regimen.  He will notify us if he develops increased joint pain or joint swelling.  He will follow up in 5 months.    HLA B27 (HLA B27 positive)  High risk medication use - Humira sq inj every 14 days.  TB gold negative on 02/28/2018. CBC and CMP were drawn on 07/15/18.  He will return for lab work in March and every 3 months.  He has not had any recent infections.  He did not receive the annual influenza vaccine.  He will be traveling to [place] this summer, and he was advised to follow up with PCP or health department to discuss possible immunizations that he will require.  He was made aware that he cannot receive any live vaccinations while on Humira.   Chronic SI joint pain: He continues to have intermittent SI joint tenderness.    Other medical conditions are listed as follows:   Tinea versicolor  History of migraine  Vitamin D deficiency  Personal history of chronic sinusitis  Other  fatigue   Orders: No orders of the defined types were placed in this encounter.  No orders of the defined types were placed in this encounter.    Follow-Up Instructions: Return in about 5 months (around 02/22/2019) for Spondyloarthritis.   Ofilia Neas, PA-C  Note - This record has been created using Dragon software.  Chart creation errors have been sought, but may not always  have been located. Such creation errors do not reflect on  the standard of medical care.

## 2018-09-24 ENCOUNTER — Ambulatory Visit (INDEPENDENT_AMBULATORY_CARE_PROVIDER_SITE_OTHER): Payer: 59 | Admitting: Physician Assistant

## 2018-09-24 ENCOUNTER — Encounter (INDEPENDENT_AMBULATORY_CARE_PROVIDER_SITE_OTHER): Payer: Self-pay

## 2018-09-24 ENCOUNTER — Encounter: Payer: Self-pay | Admitting: Physician Assistant

## 2018-09-24 VITALS — BP 114/63 | HR 59 | Resp 14 | Ht 69.0 in | Wt 230.4 lb

## 2018-09-24 DIAGNOSIS — M533 Sacrococcygeal disorders, not elsewhere classified: Secondary | ICD-10-CM | POA: Diagnosis not present

## 2018-09-24 DIAGNOSIS — Z1589 Genetic susceptibility to other disease: Secondary | ICD-10-CM

## 2018-09-24 DIAGNOSIS — R5383 Other fatigue: Secondary | ICD-10-CM

## 2018-09-24 DIAGNOSIS — B360 Pityriasis versicolor: Secondary | ICD-10-CM

## 2018-09-24 DIAGNOSIS — Z79899 Other long term (current) drug therapy: Secondary | ICD-10-CM | POA: Diagnosis not present

## 2018-09-24 DIAGNOSIS — M47819 Spondylosis without myelopathy or radiculopathy, site unspecified: Secondary | ICD-10-CM

## 2018-09-24 DIAGNOSIS — E559 Vitamin D deficiency, unspecified: Secondary | ICD-10-CM

## 2018-09-24 DIAGNOSIS — G8929 Other chronic pain: Secondary | ICD-10-CM

## 2018-09-24 DIAGNOSIS — Z8669 Personal history of other diseases of the nervous system and sense organs: Secondary | ICD-10-CM

## 2018-09-24 DIAGNOSIS — Z8709 Personal history of other diseases of the respiratory system: Secondary | ICD-10-CM

## 2018-09-24 NOTE — Patient Instructions (Signed)
Standing Labs We placed an order today for your standing lab work.    Please come back and get your standing labs in March and every 3 months  We have open lab Monday through Friday from 8:30-11:30 AM and 1:30-4:00 PM  at the office of Dr. Shaili Deveshwar.   You may experience shorter wait times on Monday and Friday afternoons. The office is located at [address], [address], [place], [place] [address] No appointment is necessary.   Labs are drawn by [facility].  You may receive a bill from [facility] for your lab work.  If you wish to have your labs drawn at another location, please call the office 24 hours in advance to send orders.  If you have any questions regarding directions or hours of operation,  please call [phone].   Just as a reminder please drink plenty of water prior to coming for your lab work. Thanks!  

## 2018-11-12 ENCOUNTER — Other Ambulatory Visit: Payer: Self-pay | Admitting: Rheumatology

## 2018-11-12 NOTE — Telephone Encounter (Signed)
Last Visit: 09/24/2018 Next Visit: 03/04/2019 Labs: 07/15/2018 LFTs are mildly elevated. TB Gold: 02/28/2018 neg  Attempted to contact patient and left message on machine advising patient he is due for labs.   Okay to refill per Dr. Corliss Skains.

## 2018-11-13 ENCOUNTER — Telehealth: Payer: Self-pay | Admitting: Rheumatology

## 2018-11-13 DIAGNOSIS — Z79899 Other long term (current) drug therapy: Secondary | ICD-10-CM

## 2018-11-13 NOTE — Telephone Encounter (Signed)
Patient called requesting labwork orders be sent to [facility] in [place] this afternoon.

## 2018-11-13 NOTE — Telephone Encounter (Signed)
Lab orders released.  

## 2018-11-14 ENCOUNTER — Telehealth (INDEPENDENT_AMBULATORY_CARE_PROVIDER_SITE_OTHER): Payer: Self-pay | Admitting: Rheumatology

## 2018-11-14 LAB — CBC WITH DIFFERENTIAL/PLATELET
Absolute Monocytes: 755 cells/uL (ref 200–950)
Basophils Absolute: 39 cells/uL (ref 0–200)
Basophils Relative: 0.5 %
Eosinophils Absolute: 262 cells/uL (ref 15–500)
Eosinophils Relative: 3.4 %
HCT: 46.4 % (ref 38.5–50.0)
Hemoglobin: 16.5 g/dL (ref 13.2–17.1)
Lymphs Abs: 2056 cells/uL (ref 850–3900)
MCH: 31.7 pg (ref 27.0–33.0)
MCHC: 35.6 g/dL (ref 32.0–36.0)
MCV: 89.2 fL (ref 80.0–100.0)
MPV: 11.1 fL (ref 7.5–12.5)
Monocytes Relative: 9.8 %
Neutro Abs: 4589 cells/uL (ref 1500–7800)
Neutrophils Relative %: 59.6 %
Platelets: 265 10*3/uL (ref 140–400)
RBC: 5.2 10*6/uL (ref 4.20–5.80)
RDW: 12.6 % (ref 11.0–15.0)
Total Lymphocyte: 26.7 %
WBC: 7.7 10*3/uL (ref 3.8–10.8)

## 2018-11-14 LAB — COMPLETE METABOLIC PANEL WITH GFR
AG Ratio: 1.8 (calc) (ref 1.0–2.5)
ALT: 28 U/L (ref 9–46)
AST: 19 U/L (ref 10–40)
Albumin: 4.4 g/dL (ref 3.6–5.1)
Alkaline phosphatase (APISO): 97 U/L (ref 36–130)
BUN: 17 mg/dL (ref 7–25)
CO2: 22 mmol/L (ref 20–32)
Calcium: 9.7 mg/dL (ref 8.6–10.3)
Chloride: 106 mmol/L (ref 98–110)
Creat: 1.12 mg/dL (ref 0.60–1.35)
GFR, Est African American: 93 mL/min/{1.73_m2} (ref >=60)
GFR, Est Non African American: 81 mL/min/{1.73_m2} (ref >=60)
Globulin: 2.5 g/dL (calc) (ref 1.9–3.7)
Glucose, Bld: 87 mg/dL (ref 65–99)
Potassium: 4.4 mmol/L (ref 3.5–5.3)
Sodium: 141 mmol/L (ref 135–146)
Total Bilirubin: 0.9 mg/dL (ref 0.2–1.2)
Total Protein: 6.9 g/dL (ref 6.1–8.1)

## 2018-11-14 LAB — SPECIMEN COMPROMISED

## 2018-11-14 NOTE — Telephone Encounter (Signed)
Called and spoke with patient. He was made aware that his lab results are all normal.

## 2018-11-14 NOTE — Telephone Encounter (Signed)
WNL

## 2019-01-08 ENCOUNTER — Telehealth: Payer: Self-pay | Admitting: Rheumatology

## 2019-01-08 NOTE — Telephone Encounter (Signed)
Patient called requesting prescription refill of Humira to be sent to [facility].  Patient states he also has a rash and hives on his chest and shoulders.  Patient states Dr. Corliss Skains prescribed a medication last year (unable to remember name) and is requesting the prescription to be sent to [facility] in [facility].

## 2019-01-09 MED ORDER — ADALIMUMAB 40 MG/0.4ML ~~LOC~~ AJKT: 40.0000 mg | AUTO-INJECTOR | SUBCUTANEOUS | 0 refills | Status: DC

## 2019-01-09 NOTE — Telephone Encounter (Signed)
Last Visit: 09/24/2018 Next Visit: 03/04/2019 Labs: 11/13/18 WNL TB Gold: 03/01/19 Neg   Okay to refill per Dr. Corliss Skains   Patient advised per Dr. Corliss Skains he should contact PCP for evaluation for the hives. Patient verbalized understanding.

## 2019-02-18 NOTE — Progress Notes (Signed)
Office Visit Note  Patient: Dennis Todd             Date of Birth: 1976-02-22           MRN: [id number]             PCP: Patient, No Pcp Per Referring: No ref. 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  He is on Humira 40 mg sq injections every 14 days.  He denies any recent flares.  He states that he has been having increased muscle tension and muscle tenderness in the trapezius muscles bilaterally.  He states he is tried taking Goody powder and has tried a heating pad.  He has not had a massage recently.  He denies any symptoms of radiculopathy at this time.  He will occasionally get tension headaches due to the muscle tightness in his neck and back.  He has tried several different pillows at night to help him sleep.  He denies any SI joint pain at this time.  He denies any Achilles tendinitis or plantar fasciitis.  He denies any eye inflammation.  He states he is been having some increased discomfort in bilateral knee joints especially after squatting for prolonged periods of time.  He has some difficulty getting up from a chair.  He denies any joint swelling.   Activities of Daily Living:  Patient reports morning stiffness for 30 minutes.   Patient Reports nocturnal pain.  Difficulty dressing/grooming: Denies Difficulty climbing stairs: Denies Difficulty getting out of chair: Reports Difficulty using hands for taps, buttons, cutlery, and/or writing: Denies  Review of Systems  Constitutional: Negative for fatigue and night sweats.  HENT: Positive for mouth dryness. Negative for mouth sores and nose dryness.   Eyes: Positive for dryness. Negative for redness.  Respiratory: Negative for cough, hemoptysis, shortness of breath and difficulty breathing.   Cardiovascular: Negative for chest pain, palpitations, hypertension, irregular heartbeat  and swelling in legs/feet.  Gastrointestinal: Negative for blood in stool, constipation and diarrhea.  Endocrine: Negative for increased urination.  Genitourinary: Negative for painful urination.  Musculoskeletal: Positive for arthralgias, joint pain, morning stiffness and muscle tenderness. Negative for joint swelling, myalgias, muscle weakness and myalgias.  Skin: Negative for color change, rash, hair loss, nodules/bumps, skin tightness, ulcers and sensitivity to sunlight.  Allergic/Immunologic: Negative for susceptible to infections.  Neurological: Negative for dizziness, fainting, memory loss, night sweats and weakness.  Hematological: Negative for swollen glands.  Psychiatric/Behavioral: Negative for depressed mood and sleep disturbance. The patient is not nervous/anxious.     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   HLA B27 (HLA B27 positive) 10/12/2016    Past Medical History:  Diagnosis Date   Arthritis    Migraine     Family History  Problem Relation Age of Onset   Diabetes Father    Rheum arthritis Brother    Healthy Daughter    Past Surgical History:  Procedure Laterality Date   ANKLE SURGERY Left    NASAL SINUS SURGERY     Social History   Social History Narrative   Not on file   Immunization History  Administered Date(s) Administered   Influenza,inj,Quad  PF,6+ Mos 07/21/2015   Influenza-Unspecified 09/27/2016     Objective: Vital Signs: BP 128/87 (BP Location: Left Arm, Patient Position: Sitting, Cuff Size: Normal)    Pulse 60    Resp 13    Ht _0  (1.753 m)    Wt 231 lb (104.8 kg)    BMI 34.11 kg/m    Physical Exam Vitals signs and nursing note reviewed.  Constitutional:      Appearance: He is well-developed.  HENT:      Head: Normocephalic and atraumatic.  Eyes:     Conjunctiva/sclera: Conjunctivae normal.     Pupils: Pupils are equal, round, and reactive to light.  Neck:     Musculoskeletal: Normal range of motion and neck supple.  Cardiovascular:     Rate and Rhythm: Normal rate and regular rhythm.     Heart sounds: Normal heart sounds.  Pulmonary:     Effort: Pulmonary effort is normal.     Breath sounds: Normal breath sounds.  Abdominal:     General: Bowel sounds are normal.     Palpations: Abdomen is soft.  Skin:    General: Skin is warm and dry.     Capillary Refill: Capillary refill takes less than 2 seconds.  Neurological:     Mental Status: He is alert and oriented to person, place, and time.  Psychiatric:        Behavior: Behavior normal.      Musculoskeletal Exam: C-spine, thoracic spine, lumbar spine good range of motion. Trapezius muscle tension and tenderness.  No midline spinal tenderness.  No SI joint tenderness.  Shoulder joints, elbow joints, wrist joints, MCPs, PIPs, DIPs good range of motion with no synovitis.  He has PIP and DIP synovial thickening consistent with osteoarthritis of bilateral hands.  He has complete fist formation bilaterally.  Hip joints, knee joints, ankle joints, MCPs, PIPs and DIPs good range of motion no synovitis.  No warmth or effusion bilateral knee joints.  No tenderness or swelling of ankle joints.  No Achilles tendinitis or plantar fasciitis.   CDAI Exam: CDAI Score: -- Patient Global: --; Provider Global: -- Swollen: --; Tender: -- Joint Exam   No joint exam has been documented for this visit   There is currently no information documented on the homunculus. Go to the Rheumatology activity and complete the homunculus joint exam.  Investigation: No additional findings.  Imaging: No results found.  Recent Labs: Lab Results  Component Value Date   WBC 7.7 11/13/2018   HGB 16.5 11/13/2018   PLT 265 11/13/2018   NA 141 11/13/2018   K 4.4  11/13/2018   CL 106 11/13/2018   CO2 22 11/13/2018   GLUCOSE 87 11/13/2018   BUN 17 11/13/2018   CREATININE 1.12 11/13/2018   BILITOT 0.9 11/13/2018   ALKPHOS 96 04/02/2017   AST 19 11/13/2018   ALT 28 11/13/2018   PROT 6.9 11/13/2018   ALBUMIN 4.5 04/02/2017   CALCIUM 9.7 11/13/2018   GFRAA 93 11/13/2018   QFTBGOLDPLUS NEGATIVE 02/28/2018    Speciality Comments: No specialty comments available.  Procedures:  No procedures performed Allergies: Patient has no known allergies.   Assessment / Plan:     Visit Diagnoses: Spondyloarthritis -He has not had any signs or symptoms of a flare recently.  He has no midline spinal tenderness or SI joint tenderness on exam today.  He has no synovitis noted.  He has no Achilles tendinitis or plantar fasciitis.  He has not had any eye  inflammation recently.  He is clinically doing well on Humira 40 mg subcutaneous injections every 14 days.  He has not missed any doses recently.  He will continue on his current treatment regimen.  He does not need any refills at this time.  We will update lab work today.  He is advised to notify us if he develops signs or symptoms of a flare.  He will follow-up in the office in 5 months.  HLA B27 (HLA B27 positive)   High risk medication use -Humira 40 mg every 14 days.  Last TB gold negative on 02/28/2018.  Due for TB gold today and will monitor yearly.  Most recent CBC/CMP within normal limits on 11/13/2018.  Due for CBC/CMP today and will monitor every 3 months.  Standing orders are in place.  We discussed the importance of holding Humira if he develops any signs or symptoms of an infection and to resume once the infection is completely cleared.  We discussed the importance of social distancing and following standard precautions recommended by the CDC.  He has not had any recent infections.  I also reminded him of the importance of having yearly skin exams while on Humira due to the increased risk of melanoma.  - Plan: CBC  with Differential/Platelet, COMPLETE METABOLIC PANEL WITH GFR, QuantiFERON-TB Gold Plus  Chronic SI joint pain -He has no SI joint tenderness on exam today.  He had bilateral SI joint cortisone junctions on 04/10/2018 which provided significant relief.  He has not had any discomfort since then.  Trapezius muscle spasm: He has been experiencing trapezius muscle tension and muscle tenderness bilaterally for the past several months.  He has tried taking Goody powder as well as using a heating pad.  We discussed going for deep tissue massage and myofascial release.  We also discussed using moist heat to relieve muscle tension.  He was given a prescription for Lidoderm patches which she can apply every 12 hours as needed.  He declined trigger point injections today.  Other medical conditions are listed as follows:  Tinea versicolor   History of migraine   Vitamin D deficiency   Other fatigue  Orders: Orders Placed This Encounter  Procedures   CBC with Differential/Platelet   COMPLETE METABOLIC PANEL WITH GFR   QuantiFERON-TB Gold Plus   Meds ordered this encounter  Medications   lidocaine (LIDODERM) 5 %    Sig: Place 1 patch onto the skin daily. Remove & Discard patch within 12 hours or as directed by MD    Dispense:  30 patch    Refill:  0    Face-to-face time spent with patient was 30 minutes. Greater than 50% of time was spent in counseling and coordination of care.  Follow-Up Instructions: Return in about 5 months (around 08/04/2019).   Ofilia Neas, PA-C  Note - This record has been created using Dragon software.  Chart creation errors have been sought, but may not always  have been located. Such creation errors do not reflect on  the standard of medical care.

## 2019-03-04 ENCOUNTER — Encounter: Payer: Self-pay | Admitting: Physician Assistant

## 2019-03-04 ENCOUNTER — Telehealth: Payer: Self-pay | Admitting: *Deleted

## 2019-03-04 ENCOUNTER — Other Ambulatory Visit: Payer: Self-pay

## 2019-03-04 ENCOUNTER — Ambulatory Visit (INDEPENDENT_AMBULATORY_CARE_PROVIDER_SITE_OTHER): Payer: 59 | Admitting: Physician Assistant

## 2019-03-04 VITALS — BP 128/87 | HR 60 | Resp 13 | Ht 69.0 in | Wt 231.0 lb

## 2019-03-04 DIAGNOSIS — Z8669 Personal history of other diseases of the nervous system and sense organs: Secondary | ICD-10-CM

## 2019-03-04 DIAGNOSIS — Z1589 Genetic susceptibility to other disease: Secondary | ICD-10-CM | POA: Diagnosis not present

## 2019-03-04 DIAGNOSIS — B360 Pityriasis versicolor: Secondary | ICD-10-CM

## 2019-03-04 DIAGNOSIS — M47819 Spondylosis without myelopathy or radiculopathy, site unspecified: Secondary | ICD-10-CM | POA: Diagnosis not present

## 2019-03-04 DIAGNOSIS — E559 Vitamin D deficiency, unspecified: Secondary | ICD-10-CM

## 2019-03-04 DIAGNOSIS — M62838 Other muscle spasm: Secondary | ICD-10-CM

## 2019-03-04 DIAGNOSIS — G8929 Other chronic pain: Secondary | ICD-10-CM

## 2019-03-04 DIAGNOSIS — Z79899 Other long term (current) drug therapy: Secondary | ICD-10-CM

## 2019-03-04 DIAGNOSIS — R5383 Other fatigue: Secondary | ICD-10-CM

## 2019-03-04 DIAGNOSIS — M533 Sacrococcygeal disorders, not elsewhere classified: Secondary | ICD-10-CM

## 2019-03-04 MED ORDER — LIDOCAINE 5 % EX PTCH: 1.0000 | MEDICATED_PATCH | CUTANEOUS | 0 refills | Status: DC

## 2019-03-04 NOTE — Telephone Encounter (Signed)
Received a Prior Authorization request from [facility] for Lidocaine Patches . Authorization has been submitted to patient's insurance via Cover My Meds. Will update once we receive a response.

## 2019-03-05 NOTE — Progress Notes (Signed)
Glucose is 107.  Rest of CMP.  CBC WNL.

## 2019-03-06 LAB — CBC WITH DIFFERENTIAL/PLATELET
Absolute Monocytes: 557 cells/uL (ref 200–950)
Basophils Absolute: 38 cells/uL (ref 0–200)
Basophils Relative: 0.6 %
Eosinophils Absolute: 320 cells/uL (ref 15–500)
Eosinophils Relative: 5 %
HCT: 48.1 % (ref 38.5–50.0)
Hemoglobin: 16.9 g/dL (ref 13.2–17.1)
Lymphs Abs: 1632 cells/uL (ref 850–3900)
MCH: 32.1 pg (ref 27.0–33.0)
MCHC: 35.1 g/dL (ref 32.0–36.0)
MCV: 91.3 fL (ref 80.0–100.0)
MPV: 10.9 fL (ref 7.5–12.5)
Monocytes Relative: 8.7 %
Neutro Abs: 3853 cells/uL (ref 1500–7800)
Neutrophils Relative %: 60.2 %
Platelets: 248 10*3/uL (ref 140–400)
RBC: 5.27 10*6/uL (ref 4.20–5.80)
RDW: 12.9 % (ref 11.0–15.0)
Total Lymphocyte: 25.5 %
WBC: 6.4 10*3/uL (ref 3.8–10.8)

## 2019-03-06 LAB — COMPLETE METABOLIC PANEL WITH GFR
AG Ratio: 1.7 (calc) (ref 1.0–2.5)
ALT: 27 U/L (ref 9–46)
AST: 21 U/L (ref 10–40)
Albumin: 4.1 g/dL (ref 3.6–5.1)
Alkaline phosphatase (APISO): 81 U/L (ref 36–130)
BUN: 17 mg/dL (ref 7–25)
CO2: 29 mmol/L (ref 20–32)
Calcium: 9.2 mg/dL (ref 8.6–10.3)
Chloride: 105 mmol/L (ref 98–110)
Creat: 1.1 mg/dL (ref 0.60–1.35)
GFR, Est African American: 95 mL/min/{1.73_m2} (ref >=60)
GFR, Est Non African American: 82 mL/min/{1.73_m2} (ref >=60)
Globulin: 2.4 g/dL (calc) (ref 1.9–3.7)
Glucose, Bld: 107 mg/dL — ABNORMAL HIGH (ref 65–99)
Potassium: 4.3 mmol/L (ref 3.5–5.3)
Sodium: 139 mmol/L (ref 135–146)
Total Bilirubin: 0.6 mg/dL (ref 0.2–1.2)
Total Protein: 6.5 g/dL (ref 6.1–8.1)

## 2019-03-06 LAB — QUANTIFERON-TB GOLD PLUS
Mitogen-NIL: >10 IU/mL
NIL: 0.02 IU/mL
QuantiFERON-TB Gold Plus: NEGATIVE
TB1-NIL: 0.01 IU/mL
TB2-NIL: 0.01 IU/mL

## 2019-03-09 NOTE — Progress Notes (Signed)
TB gold negative

## 2019-03-09 NOTE — Telephone Encounter (Signed)
He can try OTC salonpas or tigerbalm patches.  Biofreeze is also an option OTC.

## 2019-03-09 NOTE — Telephone Encounter (Signed)
Spoke with patient and advised he can try OTC salonpas or tigerbalm patches.  Biofreeze is also an option OTC.

## 2019-03-09 NOTE — Telephone Encounter (Signed)
Received a fax regarding Prior Authorization from [facility] for Lidocaine patches. Authorization has been DENIED because per insurance company patient must have a diagnosis of post herpetic neuralgia or neuropathic pain.  Will send document to scan center.  Phone# [phone]  Patient advised request has been denied. Patient would like to know if there is another option. Please advise.

## 2019-04-20 ENCOUNTER — Other Ambulatory Visit: Payer: Self-pay | Admitting: Rheumatology

## 2019-06-11 ENCOUNTER — Other Ambulatory Visit: Payer: Self-pay | Admitting: Rheumatology

## 2019-06-12 NOTE — Telephone Encounter (Signed)
Last Visit: 03/04/19 Next Visit: 08/11/19 Labs: 03/04/19 glucose is 107. Rest of CMP. CBC WNL.  TB Gold: 03/04/19 Neg   Okay to refill per Dr. Estanislado Pandy

## 2019-07-23 ENCOUNTER — Other Ambulatory Visit: Payer: Self-pay | Admitting: Rheumatology

## 2019-07-24 ENCOUNTER — Other Ambulatory Visit: Payer: Self-pay

## 2019-07-24 DIAGNOSIS — Z79899 Other long term (current) drug therapy: Secondary | ICD-10-CM

## 2019-07-24 NOTE — Telephone Encounter (Signed)
We need labs prior to refill.

## 2019-07-24 NOTE — Progress Notes (Signed)
Virtual Visit via Telephone Note  I connected with Dennis Todd on 07/27/19 at 12:30 PM EST by telephone and verified that I am speaking with the correct person using two identifiers.  Location: Patient: Home  Provider: Clinic  This service was conducted via virtual visit. The patient was located at home. I was located in my office.  Consent was obtained prior to the virtual visit and is aware of possible charges through their insurance for this visit.  The patient is an established patient.  Dr. Estanislado Pandy, MD conducted the virtual visit and [name], PA-C acted as scribe during the service.  Office staff helped with scheduling follow up visits after the service was conducted.     I discussed the limitations, risks, security and privacy concerns of performing an evaluation and management service by telephone and the availability of in person appointments. I also discussed with the patient that there may be a patient responsible charge related to this service. The patient expressed understanding and agreed to proceed.  CC: Pain in both knee joints  History of Present Illness: Patient is a 43 year old male with a past medical history of spondyloarthritis.  He is on Humira 40 mg sq injections every 14 days.  He denies any SI joint pain currently.  He states he had a muscle strain in his back recently which is improving.  He denies any increased neck pain or lower back pain.  He states the trapezius muscle tension has resolved since getting a new mattress and pillow.  He is having increased pain and stiffness in both knee joints.  He has been having joint stiffness for 30-45 seconds after rising from a seated position.   He denies any joint swelling currently.   Review of Systems  Constitutional: Negative for fever and malaise/fatigue.  Eyes: Negative for photophobia, pain, discharge and redness.  Respiratory: Negative for cough, shortness of breath and wheezing.   Cardiovascular: Negative for  chest pain and palpitations.  Gastrointestinal: Negative for blood in stool, constipation and diarrhea.  Genitourinary: Negative for dysuria.  Musculoskeletal: Positive for joint pain. Negative for back pain, myalgias and neck pain.       +Joint stiffness   Skin: Negative for rash.  Neurological: Negative for dizziness and headaches.  Psychiatric/Behavioral: Negative for depression. The patient is not nervous/anxious and does not have insomnia.       Observations/Objective: Physical Exam  Constitutional: He is oriented to person, place, and time.  Neurological: He is alert and oriented to person, place, and time.  Psychiatric: Mood, memory, affect and judgment normal.   Patient reports morning stiffness for 0  minutes.   Patient denies nocturnal pain.  Difficulty dressing/grooming: Denies Difficulty climbing stairs: Denies Difficulty getting out of chair: Denies Difficulty using hands for taps, buttons, cutlery, and/or writing: Denies   Assessment and Plan: Visit Diagnoses: Spondyloarthritis: He has not had any signs or symptoms of a flare recently.  He is clinically doing well on Humira 40 mg sq injections every 14 days.  He has not missed any doses recently.  He has no SI joint pain at this time.  He has no morning stiffness at this time.  He has been experiencing increased discomfort and stiffness in both knee joints after sitting for a prolonged period of time.  He has not had any joint inflammation recently.  He will continue on Humira 40 mg sq injections every 14 days.  He will continue on this current regimen.  He was  advised to notify us if he develops signs or symptoms of a flare.  He will follow up in 4-5 months.   HLA B27 (HLA B27 positive)   High risk medication use -Humira 40 mg sq injections every 14 days. CBC and CMP were drawn on 03/04/19.  He is overdue to update lab work.  Orders are in place.  He will require updated CBC and CMP every 3 months.   Chronic SI joint  pain: He is not having any SI joint pain currently.    Trapezius muscle spasm: Resolved.  He got a new mattress and pillow which have resolved his discomfort.   Other medical conditions are listed as follows:  Tinea versicolor   History of migraine   Vitamin D deficiency   Other fatigue  Follow Up Instructions: He will follow up in 4-5 months.     I discussed the assessment and treatment plan with the patient. The patient was provided an opportunity to ask questions and all were answered. The patient agreed with the plan and demonstrated an understanding of the instructions.   The patient was advised to call back or seek an in-person evaluation if the symptoms worsen or if the condition fails to improve as anticipated.  I provided 15 minutes of non-face-to-face time during this encounter.   [name], MD    Scribed by-   [name], PA-C

## 2019-07-24 NOTE — Telephone Encounter (Signed)
Last Visit: 03/04/2019 Next Visit: 08/11/2019 Labs: 03/04/2019 Glucose is 107. Rest of CMP. CBC WNL.  TB Gold: 03/04/2019 negative   Attempted to contact patient and left message on machine to advise patient he is due to update labs (at a main [facility] or [facility]). Advised patient to call back with location so orders can be released.   Okay to refill 30 day supply of humira?

## 2019-07-24 NOTE — Telephone Encounter (Signed)
Patient returned the call and is going for [facility] today at [facility] in [place]. Orders have been released.

## 2019-07-27 ENCOUNTER — Encounter: Payer: Self-pay | Admitting: Rheumatology

## 2019-07-27 ENCOUNTER — Telehealth (INDEPENDENT_AMBULATORY_CARE_PROVIDER_SITE_OTHER): Payer: 59 | Admitting: Rheumatology

## 2019-07-27 ENCOUNTER — Other Ambulatory Visit: Payer: Self-pay

## 2019-07-27 DIAGNOSIS — Z8669 Personal history of other diseases of the nervous system and sense organs: Secondary | ICD-10-CM

## 2019-07-27 DIAGNOSIS — B360 Pityriasis versicolor: Secondary | ICD-10-CM

## 2019-07-27 DIAGNOSIS — M533 Sacrococcygeal disorders, not elsewhere classified: Secondary | ICD-10-CM | POA: Diagnosis not present

## 2019-07-27 DIAGNOSIS — Z1589 Genetic susceptibility to other disease: Secondary | ICD-10-CM

## 2019-07-27 DIAGNOSIS — Z8709 Personal history of other diseases of the respiratory system: Secondary | ICD-10-CM

## 2019-07-27 DIAGNOSIS — R5383 Other fatigue: Secondary | ICD-10-CM

## 2019-07-27 DIAGNOSIS — M62838 Other muscle spasm: Secondary | ICD-10-CM | POA: Diagnosis not present

## 2019-07-27 DIAGNOSIS — G8929 Other chronic pain: Secondary | ICD-10-CM

## 2019-07-27 DIAGNOSIS — M47819 Spondylosis without myelopathy or radiculopathy, site unspecified: Secondary | ICD-10-CM | POA: Diagnosis not present

## 2019-07-27 DIAGNOSIS — E559 Vitamin D deficiency, unspecified: Secondary | ICD-10-CM

## 2019-07-27 DIAGNOSIS — Z79899 Other long term (current) drug therapy: Secondary | ICD-10-CM

## 2019-08-04 ENCOUNTER — Telehealth: Payer: Self-pay | Admitting: Rheumatology

## 2019-08-04 NOTE — Telephone Encounter (Signed)
-----   Message from Shona Needles, RT sent at 07/27/2019  2:12 PM EST ----- Regarding: 4-5 MONTH F/U

## 2019-08-04 NOTE — Telephone Encounter (Signed)
LMOM for patient to call and schedule 4-5 month follow-up appointment. 

## 2019-08-06 LAB — COMPLETE METABOLIC PANEL WITH GFR
AG Ratio: 1.9 (calc) (ref 1.0–2.5)
ALT: 42 U/L (ref 9–46)
AST: 21 U/L (ref 10–40)
Albumin: 4.3 g/dL (ref 3.6–5.1)
Alkaline phosphatase (APISO): 81 U/L (ref 36–130)
BUN: 21 mg/dL (ref 7–25)
CO2: 30 mmol/L (ref 20–32)
Calcium: 9.6 mg/dL (ref 8.6–10.3)
Chloride: 104 mmol/L (ref 98–110)
Creat: 1.1 mg/dL (ref 0.60–1.35)
GFR, Est African American: 95 mL/min/{1.73_m2} (ref >=60)
GFR, Est Non African American: 82 mL/min/{1.73_m2} (ref >=60)
Globulin: 2.3 g/dL (calc) (ref 1.9–3.7)
Glucose, Bld: 180 mg/dL — ABNORMAL HIGH (ref 65–99)
Potassium: 4.2 mmol/L (ref 3.5–5.3)
Sodium: 140 mmol/L (ref 135–146)
Total Bilirubin: 0.8 mg/dL (ref 0.2–1.2)
Total Protein: 6.6 g/dL (ref 6.1–8.1)

## 2019-08-06 LAB — CBC WITH DIFFERENTIAL/PLATELET
Absolute Monocytes: 526 cells/uL (ref 200–950)
Basophils Absolute: 58 cells/uL (ref 0–200)
Basophils Relative: 0.8 %
Eosinophils Absolute: 259 cells/uL (ref 15–500)
Eosinophils Relative: 3.6 %
HCT: 46.9 % (ref 38.5–50.0)
Hemoglobin: 16.6 g/dL (ref 13.2–17.1)
Lymphs Abs: 2102 cells/uL (ref 850–3900)
MCH: 31.9 pg (ref 27.0–33.0)
MCHC: 35.4 g/dL (ref 32.0–36.0)
MCV: 90 fL (ref 80.0–100.0)
MPV: 11.1 fL (ref 7.5–12.5)
Monocytes Relative: 7.3 %
Neutro Abs: 4255 cells/uL (ref 1500–7800)
Neutrophils Relative %: 59.1 %
Platelets: 289 10*3/uL (ref 140–400)
RBC: 5.21 10*6/uL (ref 4.20–5.80)
RDW: 12.8 % (ref 11.0–15.0)
Total Lymphocyte: 29.2 %
WBC: 7.2 10*3/uL (ref 3.8–10.8)

## 2019-08-10 NOTE — Progress Notes (Signed)
CBC is normal.  Blood glucose level is elevated.  I noticed that patient does not have a PCP.  He should get a fasting blood glucose level and hemoglobin A1c.  He should also establish with a PCP.  If he has trouble finding one we can establish him with one of the [facility] health PCPs.

## 2019-08-11 ENCOUNTER — Ambulatory Visit: Payer: 59 | Admitting: Physician Assistant

## 2019-09-17 ENCOUNTER — Telehealth: Payer: Self-pay | Admitting: Rheumatology

## 2019-09-17 MED ORDER — HUMIRA (2 PEN) 40 MG/0.4ML ~~LOC~~ AJKT: 40.0000 mg | AUTO-INJECTOR | SUBCUTANEOUS | 0 refills | Status: DC

## 2019-09-17 NOTE — Telephone Encounter (Addendum)
Last Visit: 07/27/2019 telemedicine  Next Visit: 12/23/2019 Labs: 08/06/2019 CBC is normal. Blood glucose level is elevated.  TB Gold: 03/04/2019 negative   Okay to refill per Dr. Corliss Skains.   Spoke with patient and advised refill has been sent to the pharmacy, patient verbalized understanding. Patient should receive medication prior to next dose, if not patient will call with any issues.

## 2019-09-17 NOTE — Telephone Encounter (Signed)
Patient called requesting prescription refill of Humira to be sent to the [facility] specialty pharmacy.  Patient states he took his last injection today and doesn't have any more refills.  Patient states in the past there has been mis-communication between Dr. Fatima Sanger office and the specialty pharmacy so he is requesting a return call to let him know the prescription has been sent.

## 2019-12-05 ENCOUNTER — Other Ambulatory Visit: Payer: Self-pay | Admitting: Rheumatology

## 2019-12-07 NOTE — Telephone Encounter (Signed)
Last Visit: 07/27/2019 telemedicine  Next Visit: 12/23/2019 Labs: 08/06/2019 CBC is normal. Blood glucose level is elevated. TB Gold: 03/04/2019 negative   Attempted to contact patient and left message on machine to advise patient he is due up update labs.   Okay to refill 30 day supply of humira?

## 2019-12-08 NOTE — Telephone Encounter (Signed)
Ok to refill 90 supply of Humira once labs have resulted.

## 2019-12-16 NOTE — Progress Notes (Signed)
Office Visit Note  Patient: Dennis Todd             Date of Birth: [birth year]           MRN: [id number]             PCP: Medicine, [facility] Referring: Medicine, [place]* Visit Date: 12/23/2019 Occupation: '@GUAROCC' @  Subjective:  Pain in joints   History of Present Illness: Dennis Todd is a 44 y.o. male with history of a spondyloarthropathy.  He states he has been taking Humira every 2 weeks on a regular basis although he missed his last Humira which was delayed by 3 weeks.  He denies any joint swelling.  He states he injured his in December and he is still gradually recovering from it.  He states the pain is mostly in the muscles of his thoracic and lumbar region.  He has been also having some discomfort in his right shoulder joint.  Which she describes over the anterior part of the shoulder.  He also related to the work injury.  Activities of Daily Living:  Patient reports morning stiffness for 1 hour.   Patient Reports nocturnal pain.  Difficulty dressing/grooming: Denies Difficulty climbing stairs: Denies Difficulty getting out of chair: Denies Difficulty using hands for taps, buttons, cutlery, and/or writing: Reports  Review of Systems  Constitutional: Negative for fatigue and night sweats.  HENT: Negative for mouth sores, mouth dryness and nose dryness.   Eyes: Negative for redness and dryness.  Respiratory: Negative for shortness of breath and difficulty breathing.   Cardiovascular: Negative for chest pain, palpitations, hypertension, irregular heartbeat and swelling in legs/feet.  Gastrointestinal: Negative for constipation and diarrhea.  Endocrine: Negative for excessive thirst and increased urination.  Genitourinary: Negative for difficulty urinating.  Musculoskeletal: Positive for arthralgias, joint pain, muscle weakness and morning stiffness. Negative for joint swelling, myalgias, muscle tenderness and myalgias.  Skin: Negative for  color change, rash, hair loss, nodules/bumps, skin tightness, ulcers and sensitivity to sunlight.  Allergic/Immunologic: Negative for susceptible to infections.  Neurological: Negative for dizziness, fainting, numbness, memory loss, night sweats and weakness ( ).  Hematological: Negative for bruising/bleeding tendency and swollen glands.  Psychiatric/Behavioral: Negative for depressed mood and sleep disturbance. The patient is not nervous/anxious.     PMFS History:  Patient Active Problem List   Diagnosis Date Noted  . History of migraine 12/19/2016  . High risk medication use 10/20/2016  . Personal history of chronic sinusitis 10/20/2016  . Tinea versicolor 10/20/2016  . 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 Arthritis   . Migraine     Family History  Problem Relation Age of Onset  . Diabetes Father   . Rheum arthritis Brother   . Healthy Daughter    Past Surgical History:  Procedure Laterality Date  . ANKLE SURGERY Left   . NASAL SINUS SURGERY     Social History   Social History Narrative  . Not on file   Immunization History  Administered Date(s) Administered  . Influenza,inj,Quad PF,6+ Mos 07/21/2015  . Influenza-Unspecified 09/27/2016     Objective: Vital Signs: BP 109/70 (BP Location: Left Arm, Patient Position: Sitting, Cuff Size: Normal)   Pulse 70   Resp 14   Ht '5\' 9"'  (1.753 m)   Wt 201 lb 6.4 oz (91.4 kg)   BMI 29.74 kg/m    Physical Exam Vitals and nursing note  reviewed.  Constitutional:      Appearance: He is well-developed.  HENT:     Head: Normocephalic and atraumatic.  Eyes:     Conjunctiva/sclera: Conjunctivae normal.     Pupils: Pupils are equal, round, and reactive to light.  Cardiovascular:     Rate and Rhythm: Normal rate and regular rhythm.     Heart sounds: Normal heart sounds.  Pulmonary:     Effort: Pulmonary effort is normal.     Breath sounds: Normal  breath sounds.  Abdominal:     General: Bowel sounds are normal.     Palpations: Abdomen is soft.  Musculoskeletal:     Cervical back: Normal range of motion and neck supple.  Skin:    General: Skin is warm and dry.     Capillary Refill: Capillary refill takes less than 2 seconds.  Neurological:     Mental Status: He is alert and oriented to person, place, and time.  Psychiatric:        Behavior: Behavior normal.      Musculoskeletal Exam: C-spine thoracic and lumbar spine with good range of motion.  He has some discomfort on forward flexion in the paravertebral region.  No SI joint tenderness was noted.  Shoulder joints with good range of motion.  He had some discomfort in the anterior aspect of his right shoulder on internal rotation.  Elbow joints, wrist joints, MCPs and PIPs with good range of motion with no synovitis.  Hip joints, knee joints, ankles with good range of motion.  He had no tenderness over MTPs.  CDAI Exam: CDAI Score: -- Patient Global: --; Provider Global: -- Swollen: --; Tender: -- Joint Exam 12/23/2019   No joint exam has been documented for this visit   There is currently no information documented on the homunculus. Go to the Rheumatology activity and complete the homunculus joint exam.  Investigation: No additional findings.  Imaging: No results found.  Recent Labs: Lab Results  Component Value Date   WBC 7.2 08/06/2019   HGB 16.6 08/06/2019   PLT 289 08/06/2019   NA 140 08/06/2019   K 4.2 08/06/2019   CL 104 08/06/2019   CO2 30 08/06/2019   GLUCOSE 180 (H) 08/06/2019   BUN 21 08/06/2019   CREATININE 1.10 08/06/2019   BILITOT 0.8 08/06/2019   ALKPHOS 96 04/02/2017   AST 21 08/06/2019   ALT 42 08/06/2019   PROT 6.6 08/06/2019   ALBUMIN 4.5 04/02/2017   CALCIUM 9.6 08/06/2019   GFRAA 95 08/06/2019   QFTBGOLDPLUS NEGATIVE 03/04/2019    Speciality Comments: No specialty comments available.  Procedures:  No procedures  performed Allergies: Patient has no known allergies.   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  HLA B27 (HLA B27 positive)  High risk medication use - Humira 40 mg sq injections every 14 days - Plan: CBC with Differential/Platelet, COMPLETE METABOLIC PANEL WITH GFR, QuantiFERON-TB Gold Plus today and then every 3 months to monitor for drug toxicity.  Acute pain of right shoulder-the clinical findings are consistent with tendinopathy.  Have given him a handout on shoulder joint exercises.  He declined physical therapy.  Chronic bilateral low back pain without sciatica-he has no point tenderness over thoracic or lumbar spine.  He had some discomfort in the paraspinal muscles.  I have given him a handout on back exercises.  He denies any radiculopathy.  Chronic SI joint  pain-currently not having much discomfort.  Vitamin D deficiency-the level was normal in 2019.  Personal history of chronic sinusitis  History of migraine  Tinea versicolor-currently not active Orders: Orders Placed This Encounter  Procedures  . CBC with Differential/Platelet  . COMPLETE METABOLIC PANEL WITH GFR  . QuantiFERON-TB Gold Plus   No orders of the defined types were placed in this encounter.    Follow-Up Instructions: Return in about 5 months (around 05/24/2020) for Spondyloarthropathy.   [name], MD  Note - This record has been created using Editor, commissioning.  Chart creation errors have been sought, but may not always  have been located. Such creation errors do not reflect on  the standard of medical care.

## 2019-12-23 ENCOUNTER — Encounter: Payer: Self-pay | Admitting: Physician Assistant

## 2019-12-23 ENCOUNTER — Ambulatory Visit (INDEPENDENT_AMBULATORY_CARE_PROVIDER_SITE_OTHER): Payer: 59 | Admitting: Rheumatology

## 2019-12-23 ENCOUNTER — Other Ambulatory Visit: Payer: Self-pay

## 2019-12-23 VITALS — BP 109/70 | HR 70 | Resp 14 | Ht 69.0 in | Wt 201.4 lb

## 2019-12-23 DIAGNOSIS — M545 Low back pain: Secondary | ICD-10-CM

## 2019-12-23 DIAGNOSIS — M533 Sacrococcygeal disorders, not elsewhere classified: Secondary | ICD-10-CM

## 2019-12-23 DIAGNOSIS — Z79899 Other long term (current) drug therapy: Secondary | ICD-10-CM

## 2019-12-23 DIAGNOSIS — B360 Pityriasis versicolor: Secondary | ICD-10-CM

## 2019-12-23 DIAGNOSIS — M25511 Pain in right shoulder: Secondary | ICD-10-CM

## 2019-12-23 DIAGNOSIS — Z8669 Personal history of other diseases of the nervous system and sense organs: Secondary | ICD-10-CM

## 2019-12-23 DIAGNOSIS — Z1589 Genetic susceptibility to other disease: Secondary | ICD-10-CM | POA: Diagnosis not present

## 2019-12-23 DIAGNOSIS — Z8709 Personal history of other diseases of the respiratory system: Secondary | ICD-10-CM

## 2019-12-23 DIAGNOSIS — M62838 Other muscle spasm: Secondary | ICD-10-CM

## 2019-12-23 DIAGNOSIS — E559 Vitamin D deficiency, unspecified: Secondary | ICD-10-CM

## 2019-12-23 DIAGNOSIS — R5383 Other fatigue: Secondary | ICD-10-CM

## 2019-12-23 DIAGNOSIS — M47819 Spondylosis without myelopathy or radiculopathy, site unspecified: Secondary | ICD-10-CM | POA: Diagnosis not present

## 2019-12-23 DIAGNOSIS — G8929 Other chronic pain: Secondary | ICD-10-CM

## 2019-12-23 NOTE — Patient Instructions (Addendum)
Shoulder Exercises Ask your health care provider which exercises are safe for you. Do exercises exactly as told by your health care provider and adjust them as directed. It is normal to feel mild stretching, pulling, tightness, or discomfort as you do these exercises. Stop right away if you feel sudden pain or your pain gets worse. Do not begin these exercises until told by your health care provider. Stretching exercises External rotation and abduction This exercise is sometimes called corner stretch. This exercise rotates your arm outward (external rotation) and moves your arm out from your body (abduction). 1. Stand in a doorway with one of your feet slightly in front of the other. This is called a staggered stance. If you cannot reach your forearms to the door frame, stand facing a corner of a room. 2. Choose one of the following positions as told by your health care provider: ? Place your hands and forearms on the door frame above your head. ? Place your hands and forearms on the door frame at the height of your head. ? Place your hands on the door frame at the height of your elbows. 3. Slowly move your weight onto your front foot until you feel a stretch across your chest and in the front of your shoulders. Keep your head and chest upright and keep your abdominal muscles tight. 4. Hold for __________ seconds. 5. To release the stretch, shift your weight to your back foot. Repeat __________ times. Complete this exercise __________ times a day. Extension, standing 1. Stand and hold a broomstick, a cane, or a similar object behind your back. ? Your hands should be a little wider than shoulder width apart. ? Your palms should face away from your back. 2. Keeping your elbows straight and your shoulder muscles relaxed, move the stick away from your body until you feel a stretch in your shoulders (extension). ? Avoid shrugging your shoulders while you move the stick. Keep your shoulder blades tucked  down toward the middle of your back. 3. Hold for __________ seconds. 4. Slowly return to the starting position. Repeat __________ times. Complete this exercise __________ times a day. Range-of-motion exercises Pendulum  1. Stand near a wall or a surface that you can hold onto for balance. 2. Bend at the waist and let your left / right arm hang straight down. Use your other arm to support you. Keep your back straight and do not lock your knees. 3. Relax your left / right arm and shoulder muscles, and move your hips and your trunk so your left / right arm swings freely. Your arm should swing because of the motion of your body, not because you are using your arm or shoulder muscles. 4. Keep moving your hips and trunk so your arm swings in the following directions, as told by your health care provider: ? Side to side. ? Forward and backward. ? In clockwise and counterclockwise circles. 5. Continue each motion for __________ seconds, or for as long as told by your health care provider. 6. Slowly return to the starting position. Repeat __________ times. Complete this exercise __________ times a day. Shoulder flexion, standing  1. Stand and hold a broomstick, a cane, or a similar object. Place your hands a little more than shoulder width apart on the object. Your left / right hand should be palm up, and your other hand should be palm down. 2. Keep your elbow straight and your shoulder muscles relaxed. Push the stick up with your healthy arm to   raise your left / right arm in front of your body, and then over your head until you feel a stretch in your shoulder (flexion). ? Avoid shrugging your shoulder while you raise your arm. Keep your shoulder blade tucked down toward the middle of your back. 3. Hold for __________ seconds. 4. Slowly return to the starting position. Repeat __________ times. Complete this exercise __________ times a day. Shoulder abduction, standing 1. Stand and hold a broomstick,  a cane, or a similar object. Place your hands a little more than shoulder width apart on the object. Your left / right hand should be palm up, and your other hand should be palm down. 2. Keep your elbow straight and your shoulder muscles relaxed. Push the object across your body toward your left / right side. Raise your left / right arm to the side of your body (abduction) until you feel a stretch in your shoulder. ? Do not raise your arm above shoulder height unless your health care provider tells you to do that. ? If directed, raise your arm over your head. ? Avoid shrugging your shoulder while you raise your arm. Keep your shoulder blade tucked down toward the middle of your back. 3. Hold for __________ seconds. 4. Slowly return to the starting position. Repeat __________ times. Complete this exercise __________ times a day. Internal rotation  1. Place your left / right hand behind your back, palm up. 2. Use your other hand to dangle an exercise band, a towel, or a similar object over your shoulder. Grasp the band with your left / right hand so you are holding on to both ends. 3. Gently pull up on the band until you feel a stretch in the front of your left / right shoulder. The movement of your arm toward the center of your body is called internal rotation. ? Avoid shrugging your shoulder while you raise your arm. Keep your shoulder blade tucked down toward the middle of your back. 4. Hold for __________ seconds. 5. Release the stretch by letting go of the band and lowering your hands. Repeat __________ times. Complete this exercise __________ times a day. Strengthening exercises External rotation  1. Sit in a stable chair without armrests. 2. Secure an exercise band to a stable object at elbow height on your left / right side. 3. Place a soft object, such as a folded towel or a small pillow, between your left / right upper arm and your body to move your elbow about 4 inches (10 cm) away  from your side. 4. Hold the end of the exercise band so it is tight and there is no slack. 5. Keeping your elbow pressed against the soft object, slowly move your forearm out, away from your abdomen (external rotation). Keep your body steady so only your forearm moves. 6. Hold for __________ seconds. 7. Slowly return to the starting position. Repeat __________ times. Complete this exercise __________ times a day. Shoulder abduction  1. Sit in a stable chair without armrests, or stand up. 2. Hold a __________ weight in your left / right hand, or hold an exercise band with both hands. 3. Start with your arms straight down and your left / right palm facing in, toward your body. 4. Slowly lift your left / right hand out to your side (abduction). Do not lift your hand above shoulder height unless your health care provider tells you that this is safe. ? Keep your arms straight. ? Avoid shrugging your shoulder while you   do this movement. Keep your shoulder blade tucked down toward the middle of your back. 5. Hold for __________ seconds. 6. Slowly lower your arm, and return to the starting position. Repeat __________ times. Complete this exercise __________ times a day. Shoulder extension 1. Sit in a stable chair without armrests, or stand up. 2. Secure an exercise band to a stable object in front of you so it is at shoulder height. 3. Hold one end of the exercise band in each hand. Your palms should face each other. 4. Straighten your elbows and lift your hands up to shoulder height. 5. Step back, away from the secured end of the exercise band, until the band is tight and there is no slack. 6. Squeeze your shoulder blades together as you pull your hands down to the sides of your thighs (extension). Stop when your hands are straight down by your sides. Do not let your hands go behind your body. 7. Hold for __________ seconds. 8. Slowly return to the starting position. Repeat __________ times.  Complete this exercise __________ times a day. Shoulder row 1. Sit in a stable chair without armrests, or stand up. 2. Secure an exercise band to a stable object in front of you so it is at waist height. 3. Hold one end of the exercise band in each hand. Position your palms so that your thumbs are facing the ceiling (neutral position). 4. Bend each of your elbows to a 90-degree angle (right angle) and keep your upper arms at your sides. 5. Step back until the band is tight and there is no slack. 6. Slowly pull your elbows back behind you. 7. Hold for __________ seconds. 8. Slowly return to the starting position. Repeat __________ times. Complete this exercise __________ times a day. Shoulder press-ups  1. Sit in a stable chair that has armrests. Sit upright, with your feet flat on the floor. 2. Put your hands on the armrests so your elbows are bent and your fingers are pointing forward. Your hands should be about even with the sides of your body. 3. Push down on the armrests and use your arms to lift yourself off the chair. Straighten your elbows and lift yourself up as much as you comfortably can. ? Move your shoulder blades down, and avoid letting your shoulders move up toward your ears. ? Keep your feet on the ground. As you get stronger, your feet should support less of your body weight as you lift yourself up. 4. Hold for __________ seconds. 5. Slowly lower yourself back into the chair. Repeat __________ times. Complete this exercise __________ times a day. Wall push-ups  1. Stand so you are facing a stable wall. Your feet should be about one arm-length away from the wall. 2. Lean forward and place your palms on the wall at shoulder height. 3. Keep your feet flat on the floor as you bend your elbows and lean forward toward the wall. 4. Hold for __________ seconds. 5. Straighten your elbows to push yourself back to the starting position. Repeat __________ times. Complete this exercise  __________ times a day. This information is not intended to replace advice given to you by your health care provider. Make sure you discuss any questions you have with your health care provider. Document Revised: 11/21/2018 Document Reviewed: 08/29/2018 [employer]vier Patient Education  2020 [employer] Back Exercises The following exercises strengthen the muscles that help to support the trunk and back. They also help to keep the lower back flexible. Doing these   exercises can help to prevent back pain or lessen existing pain.  If you have back pain or discomfort, try doing these exercises 2-3 times each day or as told by your health care provider.  As your pain improves, do them once each day, but increase the number of times that you repeat the steps for each exercise (do more repetitions).  To prevent the recurrence of back pain, continue to do these exercises once each day or as told by your health care provider. Do exercises exactly as told by your health care provider and adjust them as directed. It is normal to feel mild stretching, pulling, tightness, or discomfort as you do these exercises, but you should stop right away if you feel sudden pain or your pain gets worse. Exercises Single knee to chest Repeat these steps 3-5 times for each leg: 1. Lie on your back on a firm bed or the floor with your legs extended. 2. Bring one knee to your chest. Your other leg should stay extended and in contact with the floor. 3. Hold your knee in place by grabbing your knee or thigh with both hands and hold. 4. Pull on your knee until you feel a gentle stretch in your lower back or buttocks. 5. Hold the stretch for 10-30 seconds. 6. Slowly release and straighten your leg. Pelvic tilt Repeat these steps 5-10 times: 1. Lie on your back on a firm bed or the floor with your legs extended. 2. Bend your knees so they are pointing toward the ceiling and your feet are flat on the floor. 3. Tighten your lower  abdominal muscles to press your lower back against the floor. This motion will tilt your pelvis so your tailbone points up toward the ceiling instead of pointing to your feet or the floor. 4. With gentle tension and even breathing, hold this position for 5-10 seconds. Cat-cow Repeat these steps until your lower back becomes more flexible: 1. Get into a hands-and-knees position on a firm surface. Keep your hands under your shoulders, and keep your knees under your hips. You may place padding under your knees for comfort. 2. Let your head hang down toward your chest. Contract your abdominal muscles and point your tailbone toward the floor so your lower back becomes rounded like the back of a cat. 3. Hold this position for 5 seconds. 4. Slowly lift your head, let your abdominal muscles relax and point your tailbone up toward the ceiling so your back forms a sagging arch like the back of a cow. 5. Hold this position for 5 seconds.  Press-ups Repeat these steps 5-10 times: 1. Lie on your abdomen (face-down) on the floor. 2. Place your palms near your head, about shoulder-width apart. 3. Keeping your back as relaxed as possible and keeping your hips on the floor, slowly straighten your arms to raise the top half of your body and lift your shoulders. Do not use your back muscles to raise your upper torso. You may adjust the placement of your hands to make yourself more comfortable. 4. Hold this position for 5 seconds while you keep your back relaxed. 5. Slowly return to lying flat on the floor.  Bridges Repeat these steps 10 times: 1. Lie on your back on a firm surface. 2. Bend your knees so they are pointing toward the ceiling and your feet are flat on the floor. Your arms should be flat at your sides, next to your body. 3. Tighten your buttocks muscles and lift your   buttocks off the floor until your waist is at almost the same height as your knees. You should feel the muscles working in your  buttocks and the back of your thighs. If you do not feel these muscles, slide your feet 1-2 inches farther away from your buttocks. 4. Hold this position for 3-5 seconds. 5. Slowly lower your hips to the starting position, and allow your buttocks muscles to relax completely. If this exercise is too easy, try doing it with your arms crossed over your chest. Abdominal crunches Repeat these steps 5-10 times: 1. Lie on your back on a firm bed or the floor with your legs extended. 2. Bend your knees so they are pointing toward the ceiling and your feet are flat on the floor. 3. Cross your arms over your chest. 4. Tip your chin slightly toward your chest without bending your neck. 5. Tighten your abdominal muscles and slowly raise your trunk (torso) high enough to lift your shoulder blades a tiny bit off the floor. Avoid raising your torso higher than that because it can put too much stress on your low back and does not help to strengthen your abdominal muscles. 6. Slowly return to your starting position. Back lifts Repeat these steps 5-10 times: 1. Lie on your abdomen (face-down) with your arms at your sides, and rest your forehead on the floor. 2. Tighten the muscles in your legs and your buttocks. 3. Slowly lift your chest off the floor while you keep your hips pressed to the floor. Keep the back of your head in line with the curve in your back. Your eyes should be looking at the floor. 4. Hold this position for 3-5 seconds. 5. Slowly return to your starting position. Contact a health care provider if:  Your back pain or discomfort gets much worse when you do an exercise.  Your worsening back pain or discomfort does not lessen within 2 hours after you exercise. If you have any of these problems, stop doing these exercises right away. Do not do them again unless your health care provider says that you can. Get help right away if:  You develop sudden, severe back pain. If this happens, stop  doing the exercises right away. Do not do them again unless your health care provider says that you can. This information is not intended to replace advice given to you by your health care provider. Make sure you discuss any questions you have with your health care provider. Document Revised: 12/04/2018 Document Reviewed: 05/01/2018 Elsevier Patient Education  2020 [employer]. Standing Labs We placed an order today for your standing lab work.    Please come back and get your standing labs in July and every 3 months  We have open lab daily Monday through Thursday from 8:30-12:30 PM and 1:30-4:30 PM and Friday from 8:30-12:30 PM and 1:30-4:00 PM at the office of Dr. Pollyann Savoy.   You may experience shorter wait times on Monday and Friday afternoons. The office is located at [address], [address], [place], [place] [address] No appointment is necessary.   Labs are drawn by [facility].  You may receive a bill from [facility] for your lab work.  If you wish to have your labs drawn at another location, please call the office 24 hours in advance to send orders.  If you have any questions regarding directions or hours of operation,  please call [phone].   Just as a reminder please drink plenty of water prior to coming for  your lab work. Thanks!

## 2019-12-24 ENCOUNTER — Other Ambulatory Visit: Payer: Self-pay | Admitting: *Deleted

## 2019-12-24 NOTE — Telephone Encounter (Signed)
Last Visit: 12/23/2019 Next Visit: 05/25/2020 Labs: 12/23/2019 Absolute eosinophils are mildly elevated. Not concerning at this time. Rest of CBC WNL. CMP WNL  TB Gold: 03/04/19 Neg (update TB Gold on 12/23/2019: results pending)   Current Dose per office note 12/23/2019: Humira 40 mg sq injections every 14 days   Okay to refill Humira?

## 2019-12-24 NOTE — Telephone Encounter (Signed)
ok 

## 2019-12-25 LAB — CBC WITH DIFFERENTIAL/PLATELET
Absolute Monocytes: 604 cells/uL (ref 200–950)
Basophils Absolute: 28 cells/uL (ref 0–200)
Basophils Relative: 0.4 %
Eosinophils Absolute: 547 cells/uL — ABNORMAL HIGH (ref 15–500)
Eosinophils Relative: 7.7 %
HCT: 49.6 % (ref 38.5–50.0)
Hemoglobin: 16.9 g/dL (ref 13.2–17.1)
Lymphs Abs: 1590 cells/uL (ref 850–3900)
MCH: 31.6 pg (ref 27.0–33.0)
MCHC: 34.1 g/dL (ref 32.0–36.0)
MCV: 92.9 fL (ref 80.0–100.0)
MPV: 10.9 fL (ref 7.5–12.5)
Monocytes Relative: 8.5 %
Neutro Abs: 4331 cells/uL (ref 1500–7800)
Neutrophils Relative %: 61 %
Platelets: 255 10*3/uL (ref 140–400)
RBC: 5.34 10*6/uL (ref 4.20–5.80)
RDW: 13.1 % (ref 11.0–15.0)
Total Lymphocyte: 22.4 %
WBC: 7.1 10*3/uL (ref 3.8–10.8)

## 2019-12-25 LAB — COMPLETE METABOLIC PANEL WITH GFR
AG Ratio: 1.9 (calc) (ref 1.0–2.5)
ALT: 23 U/L (ref 9–46)
AST: 25 U/L (ref 10–40)
Albumin: 4.3 g/dL (ref 3.6–5.1)
Alkaline phosphatase (APISO): 92 U/L (ref 36–130)
BUN: 18 mg/dL (ref 7–25)
CO2: 27 mmol/L (ref 20–32)
Calcium: 9.5 mg/dL (ref 8.6–10.3)
Chloride: 106 mmol/L (ref 98–110)
Creat: 1.08 mg/dL (ref 0.60–1.35)
GFR, Est African American: 97 mL/min/{1.73_m2} (ref >=60)
GFR, Est Non African American: 84 mL/min/{1.73_m2} (ref >=60)
Globulin: 2.3 g/dL (calc) (ref 1.9–3.7)
Glucose, Bld: 105 mg/dL — ABNORMAL HIGH (ref 65–99)
Potassium: 4 mmol/L (ref 3.5–5.3)
Sodium: 141 mmol/L (ref 135–146)
Total Bilirubin: 0.7 mg/dL (ref 0.2–1.2)
Total Protein: 6.6 g/dL (ref 6.1–8.1)

## 2019-12-25 LAB — QUANTIFERON-TB GOLD PLUS
Mitogen-NIL: >10 IU/mL
NIL: 0.02 IU/mL
QuantiFERON-TB Gold Plus: NEGATIVE
TB1-NIL: 0.01 IU/mL
TB2-NIL: 0.01 IU/mL

## 2019-12-25 MED ORDER — HUMIRA (2 PEN) 40 MG/0.4ML ~~LOC~~ AJKT: 40.0000 mg | AUTO-INJECTOR | SUBCUTANEOUS | 0 refills | Status: DC

## 2019-12-27 NOTE — Progress Notes (Signed)
TB gold is negative.

## 2020-03-01 ENCOUNTER — Other Ambulatory Visit: Payer: Self-pay | Admitting: Rheumatology

## 2020-03-29 ENCOUNTER — Other Ambulatory Visit: Payer: Self-pay | Admitting: Rheumatology

## 2020-03-29 MED ORDER — HUMIRA (2 PEN) 40 MG/0.4ML ~~LOC~~ AJKT: 40.0000 mg | AUTO-INJECTOR | SUBCUTANEOUS | 0 refills | Status: DC

## 2020-03-29 NOTE — Telephone Encounter (Signed)
Patient called requesting prescription refill of Humira to be sent to [facility] 

## 2020-03-29 NOTE — Telephone Encounter (Signed)
Last Visit: 12/23/2019 Next Visit: 05/25/2020 Labs: 12/23/2019 Absolute eosinophils are mildly elevated. Not concerning at this time. Rest of CBC WNL. CMP WNL  TB Gold: 12/23/2019 Neg   Current Dose per office note 12/23/2019: Humira 40 mg sq injections every 14 days   Patient advised he is due to update labs.   Okay to refill 30 day supply Humira?

## 2020-05-02 ENCOUNTER — Other Ambulatory Visit: Payer: Self-pay | Admitting: Physician Assistant

## 2020-05-02 NOTE — Telephone Encounter (Signed)
Last Visit:12/23/2019 Next Visit:05/25/2020 Labs:5/12/2021Absolute eosinophils are mildly elevated. Not concerning at this time. Rest of CBC WNL. CMP WNL TB Gold:12/23/2019 Neg   Current Dose per office note5/07/2020:Humira 40 mg sq injections every 14 days  Patient advised he is due to update labs. Patient states he will try to come one day this week to update.   Okay to refill 30 day supply Humira?

## 2020-05-06 ENCOUNTER — Other Ambulatory Visit: Payer: Self-pay

## 2020-05-06 DIAGNOSIS — Z79899 Other long term (current) drug therapy: Secondary | ICD-10-CM

## 2020-05-06 LAB — COMPLETE METABOLIC PANEL WITH GFR
AG Ratio: 2.5 (calc) (ref 1.0–2.5)
ALT: 20 U/L (ref 9–46)
AST: 20 U/L (ref 10–40)
Albumin: 4.5 g/dL (ref 3.6–5.1)
Alkaline phosphatase (APISO): 84 U/L (ref 36–130)
BUN: 21 mg/dL (ref 7–25)
CO2: 27 mmol/L (ref 20–32)
Calcium: 9 mg/dL (ref 8.6–10.3)
Chloride: 105 mmol/L (ref 98–110)
Creat: 1.28 mg/dL (ref 0.60–1.35)
GFR, Est African American: 79 mL/min/{1.73_m2} (ref >=60)
GFR, Est Non African American: 68 mL/min/{1.73_m2} (ref >=60)
Globulin: 1.8 g/dL (calc) — ABNORMAL LOW (ref 1.9–3.7)
Glucose, Bld: 87 mg/dL (ref 65–99)
Potassium: 4.1 mmol/L (ref 3.5–5.3)
Sodium: 140 mmol/L (ref 135–146)
Total Bilirubin: 0.8 mg/dL (ref 0.2–1.2)
Total Protein: 6.3 g/dL (ref 6.1–8.1)

## 2020-05-06 LAB — CBC WITH DIFFERENTIAL/PLATELET
Absolute Monocytes: 873 cells/uL (ref 200–950)
Basophils Absolute: 47 cells/uL (ref 0–200)
Basophils Relative: 0.4 %
Eosinophils Absolute: 354 cells/uL (ref 15–500)
Eosinophils Relative: 3 %
HCT: 45.1 % (ref 38.5–50.0)
Hemoglobin: 16 g/dL (ref 13.2–17.1)
Lymphs Abs: 2159 cells/uL (ref 850–3900)
MCH: 33 pg (ref 27.0–33.0)
MCHC: 35.5 g/dL (ref 32.0–36.0)
MCV: 93 fL (ref 80.0–100.0)
MPV: 11.4 fL (ref 7.5–12.5)
Monocytes Relative: 7.4 %
Neutro Abs: 8366 cells/uL — ABNORMAL HIGH (ref 1500–7800)
Neutrophils Relative %: 70.9 %
Platelets: 257 10*3/uL (ref 140–400)
RBC: 4.85 10*6/uL (ref 4.20–5.80)
RDW: 12.9 % (ref 11.0–15.0)
Total Lymphocyte: 18.3 %
WBC: 11.8 10*3/uL — ABNORMAL HIGH (ref 3.8–10.8)

## 2020-05-08 NOTE — Progress Notes (Signed)
WBC is elevated, CMP normal. Please ask if pt had recent infection, Prednisone po or injectable. We will continue to monitor labs.

## 2020-05-11 NOTE — Progress Notes (Signed)
Office Visit Note  Patient: Dennis Todd             Date of Birth: Mar 30, 1976           MRN: [id number]             PCP: Medicine, [facility] Referring: Medicine, [place]* Visit Date: 05/25/2020 Occupation: _0 @  Subjective:  Medication monitoring   History of Present Illness: Dennis Todd is a 44 y.o. 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  He is on Humira 40 mg sq injections once every 14 days. He has not missed any doses recently. He denies any recent flares.  He continues to have chronic back pain and has established care with Dr. Francesco Runner.  He has been getting a trial of injections and the plan is to schedule a nerve ablation after his last injection on 06/07/20.  He states his back pain is typically a 5/10.  He continues to have occasional SI joint pain but it has been tolerable recently.  He denies any other joint pain or joint swelling at this time.  He denies any achilles tendonitis or plantar fasciitis.  He denies any signs or symptoms of eye inflammation.  He states he occasionally develops a rash on the palmar aspect of both hands.  He has tried topical agents in the past, but he states the rash typically resolves on its own.  He denies any recent infections.  He has not received the covid-19 vaccinations yet.      Activities of Daily Living:  Patient reports morning stiffness for 30 minutes.   Patient Reports nocturnal pain.  Difficulty dressing/grooming: Denies Difficulty climbing stairs: Denies Difficulty getting out of chair: Reports Difficulty using hands for taps, buttons, cutlery, and/or writing: Reports  Review of Systems  Constitutional: Negative for fatigue.  HENT: Negative for mouth sores, mouth dryness and nose dryness.   Eyes: Negative for pain, itching and dryness.  Respiratory: Negative for shortness of breath and difficulty breathing.   Cardiovascular: Negative for chest pain and palpitations.  Gastrointestinal:  Negative for blood in stool, constipation and diarrhea.  Endocrine: Negative for increased urination.  Genitourinary: Negative for difficulty urinating and painful urination.  Musculoskeletal: Positive for arthralgias, joint pain, myalgias, morning stiffness and myalgias. Negative for joint swelling and muscle tenderness.  Skin: Positive for rash. Negative for color change.  Allergic/Immunologic: Negative for susceptible to infections.  Neurological: Positive for headaches. Negative for dizziness, numbness, memory loss and weakness.  Hematological: Negative for bruising/bleeding tendency.  Psychiatric/Behavioral: Negative for confusion and sleep disturbance.    PMFS History:  Patient Active Problem List   Diagnosis Date Noted  . History of migraine 12/19/2016  . High risk medication use 10/20/2016  . Personal history of chronic sinusitis 10/20/2016  . Tinea versicolor 10/20/2016  . 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 Arthritis   . Migraine     Family History  Problem Relation Age of Onset  . Diabetes Father   . Rheum arthritis Brother   . Healthy Daughter    Past Surgical History:  Procedure Laterality Date  . ANKLE SURGERY Left   . back injections    . NASAL SINUS SURGERY     Social History   Social History Narrative  . Not on file   Immunization History  Administered Date(s) Administered  . Influenza,inj,Quad PF,6+ Mos 07/21/2015  . Influenza-Unspecified 09/27/2016  Objective: Vital Signs: BP 135/87 (BP Location: Left Arm, Patient Position: Sitting, Cuff Size: Normal)   Pulse (!) 58   Resp 15   Ht _0  (1.753 m)   Wt 203 lb 3.2 oz (92.2 kg)   BMI 30.01 kg/m    Physical Exam Vitals and nursing note reviewed.  Constitutional:      Appearance: He is well-developed.  HENT:     Head: Normocephalic and atraumatic.  Eyes:     Conjunctiva/sclera: Conjunctivae normal.      Pupils: Pupils are equal, round, and reactive to light.  Pulmonary:     Effort: Pulmonary effort is normal.  Abdominal:     Palpations: Abdomen is soft.  Musculoskeletal:     Cervical back: Normal range of motion and neck supple.  Skin:    General: Skin is warm and dry.     Capillary Refill: Capillary refill takes less than 2 seconds.  Neurological:     Mental Status: He is alert and oriented to person, place, and time.  Psychiatric:        Behavior: Behavior normal.      Musculoskeletal Exam: C-spine has good range of motion with some discomfort with lateral rotation. Thoracic and lumbar spine have good range of motion. No midline spinal tenderness. No SI joint tenderness. Shoulder joints, elbow joints, wrist joints, MCPs, PIPs, and DIPs have good range of motion with no synovitis. He has complete fist formation bilaterally. Hip joints have good range of motion. Knee joints have good range of motion with no warmth or effusion. Ankle joints have good range of motion with no tenderness or inflammation. No tenderness of Achilles tendon or plantar fascia.   CDAI Exam: CDAI Score: -- Patient Global: --; Provider Global: -- Swollen: --; Tender: -- Joint Exam 05/25/2020   No joint exam has been documented for this visit   There is currently no information documented on the homunculus. Go to the Rheumatology activity and complete the homunculus joint exam.  Investigation: No additional findings.  Imaging: No results found.  Recent Labs: Lab Results  Component Value Date   WBC 11.8 (H) 05/06/2020   HGB 16.0 05/06/2020   PLT 257 05/06/2020   NA 140 05/06/2020   K 4.1 05/06/2020   CL 105 05/06/2020   CO2 27 05/06/2020   GLUCOSE 87 05/06/2020   BUN 21 05/06/2020   CREATININE 1.28 05/06/2020   BILITOT 0.8 05/06/2020   ALKPHOS 96 04/02/2017   AST 20 05/06/2020   ALT 20 05/06/2020   PROT 6.3 05/06/2020   ALBUMIN 4.5 04/02/2017   CALCIUM 9.0 05/06/2020   GFRAA 79 05/06/2020    QFTBGOLDPLUS NEGATIVE 12/23/2019    Speciality Comments: No specialty comments available.  Procedures:  No procedures performed Allergies: Patient has no known allergies.   Assessment / Plan:     Visit Diagnoses: Spondyloarthritis: He has not had any signs or symptoms of a flare recently. He is clinically doing well on Humira 40 mg subcutaneous injections once every 14 days. He has no synovitis or dactylitis on exam. He has no midline spinal tenderness or SI joint tenderness on exam. He continues to experience chronic back pain and has established care with Dr. Francesco Runner and plans on having a spinal nerve ablation soon. He has occasional SI joint pain but it has been tolerable overall. He has no Achilles tendinitis or plantar fasciitis. He has not had any signs or symptoms of IBD or eye inflammation.  He will continue on Humira 40  mg subcutaneous injections every 14 days. He does not need a refill at this time. He was advised to notify us if he develops any new or worsening symptoms. He will follow-up in the office in 5 months.  High risk medication use - Humira 40 mg sq injections every 14 days. CBC and CMP were updated on 05/06/2020. He will be due to update lab work in December and every 3 months to monitor for drug toxicity. Standing orders for CBC and CMP are in place. TB gold was negative on 12/23/2019 and will continue to be monitored yearly. He has not had any recent infections. He was advised to hold Humira if he develops signs or symptoms of an infection and to resume once the infection has completely cleared. He has not received the COVID-19 vaccination but was strongly encouraged to. He was advised to avoid taking Tylenol or NSAIDs 24 hours prior to the vaccine. He was advised to notify us or his PCP if he develops a COVID-19 infection in order to receive the antibody infusion. He voiced understanding.  HLA B27 (HLA B27 positive)  Acute pain of right shoulder - He has good ROM of the right  shoulder with no discomfort.  No tenderness to palpation on exam today.   Chronic bilateral low back pain without sciatica: Chronic pain.  He is not experiencing any symptoms of sciatica at this time.  He has established care with Dr. Francesco Runner and has undergone a trial of injections prior to scheduling a nerve ablation.  His pain level has been about a 5/10.   Chronic SI joint pain: He has no SI joint tenderness to palpation on exam today.   Other medical conditions are listed as follows:   Vitamin D deficiency - Vitamin D 39 on 10/24/17.   Personal history of chronic sinusitis  Tinea versicolor  History of migraine  Orders: No orders of the defined types were placed in this encounter.  No orders of the defined types were placed in this encounter.    Follow-Up Instructions: Return in about 5 months (around 10/23/2020) for Spondyloarthritis .   Ofilia Neas, PA-C  Note - This record has been created using Dragon software.  Chart creation errors have been sought, but may not always  have been located. Such creation errors do not reflect on  the standard of medical care.

## 2020-05-25 ENCOUNTER — Other Ambulatory Visit: Payer: Self-pay

## 2020-05-25 ENCOUNTER — Ambulatory Visit (INDEPENDENT_AMBULATORY_CARE_PROVIDER_SITE_OTHER): Payer: 59 | Admitting: Physician Assistant

## 2020-05-25 ENCOUNTER — Encounter: Payer: Self-pay | Admitting: Physician Assistant

## 2020-05-25 VITALS — BP 135/87 | HR 58 | Resp 15 | Ht 69.0 in | Wt 203.2 lb

## 2020-05-25 DIAGNOSIS — E559 Vitamin D deficiency, unspecified: Secondary | ICD-10-CM

## 2020-05-25 DIAGNOSIS — G8929 Other chronic pain: Secondary | ICD-10-CM

## 2020-05-25 DIAGNOSIS — B360 Pityriasis versicolor: Secondary | ICD-10-CM

## 2020-05-25 DIAGNOSIS — Z79899 Other long term (current) drug therapy: Secondary | ICD-10-CM

## 2020-05-25 DIAGNOSIS — Z1589 Genetic susceptibility to other disease: Secondary | ICD-10-CM

## 2020-05-25 DIAGNOSIS — M25511 Pain in right shoulder: Secondary | ICD-10-CM

## 2020-05-25 DIAGNOSIS — Z8709 Personal history of other diseases of the respiratory system: Secondary | ICD-10-CM

## 2020-05-25 DIAGNOSIS — M47819 Spondylosis without myelopathy or radiculopathy, site unspecified: Secondary | ICD-10-CM

## 2020-05-25 DIAGNOSIS — M533 Sacrococcygeal disorders, not elsewhere classified: Secondary | ICD-10-CM

## 2020-05-25 DIAGNOSIS — Z8669 Personal history of other diseases of the nervous system and sense organs: Secondary | ICD-10-CM

## 2020-05-25 DIAGNOSIS — M545 Low back pain, unspecified: Secondary | ICD-10-CM

## 2020-05-25 NOTE — Patient Instructions (Addendum)
Standing Labs We placed an order today for your standing lab work.   Please have your standing labs drawn in December and every 3 months   If possible, please have your labs drawn 2 weeks prior to your appointment so that the provider can discuss your results at your appointment.  We have open lab daily Monday through Thursday from 8:30-12:30 PM and 1:30-4:30 PM and Friday from 8:30-12:30 PM and 1:30-4:00 PM at the office of Dr. Shaili Deveshwar, [facility].   Please be advised, patients with office appointments requiring lab work will take precedents over walk-in lab work.  If possible, please come for your lab work on Monday and Friday afternoons, as you may experience shorter wait times. The office is located at [address], [address], [place], [place] [address] No appointment is necessary.   Labs are drawn by [facility]. Please bring your co-pay at the time of your lab draw.  You may receive a bill from [facility] for your lab work.  If you wish to have your labs drawn at another location, please call the office 24 hours in advance to send orders.  If you have any questions regarding directions or hours of operation,  please call [phone].   As a reminder, please drink plenty of water prior to coming for your lab work. Thanks!   COVID-19 vaccine recommendations:   COVID-19 vaccine is recommended for everyone (unless you are allergic to a vaccine component), even if you are on a medication that suppresses your immune system.   If you are on Methotrexate, Cellcept (mycophenolate), Rinvoq, Xeljanz, and Olumiant- hold the medication for 1 week after each vaccine. Hold Methotrexate for 2 weeks after the single dose COVID-19 vaccine.   If you are on Orencia subcutaneous injection - hold medication one week prior to and one week after the first COVID-19 vaccine dose (only).   If you are on Orencia IV infusions- time vaccination administration so that the first COVID-19  vaccination will occur four weeks after the infusion and postpone the subsequent infusion by one week.   If you are on Cyclophosphamide or Rituxan infusions please contact your doctor prior to receiving the COVID-19 vaccine.   Do not take Tylenol or any anti-inflammatory medications (NSAIDs) 24 hours prior to the COVID-19 vaccination.   There is no direct evidence about the efficacy of the COVID-19 vaccine in individuals who are on medications that suppress the immune system.   Even if you are fully vaccinated, and you are on any medications that suppress your immune system, please continue to wear a mask, maintain at least six feet social distance and practice hand hygiene.   If you develop a COVID-19 infection, please contact your PCP or our office to determine if you need antibody infusion.  The booster vaccine is now available for immunocompromised patients. It is advised that if you had Pfizer vaccine you should get Pfizer booster.  If you had a Moderna vaccine then you should get a Moderna booster. Johnson and Johnson does not have a booster vaccine at this time.  Please see the following web sites for updated information.   [email or web address]-Patient-Resources.[email or web address]  [email or web address]/[email or web address]    

## 2020-05-30 ENCOUNTER — Other Ambulatory Visit: Payer: Self-pay | Admitting: Rheumatology

## 2020-05-30 NOTE — Telephone Encounter (Signed)
Last Visit: 05/25/2020 Next Visit: 10/26/2020 Labs: 05/06/2020 WBC is elevated, CMP normal. TB Gold: 12/23/2019 Neg   Current Dose per office note 05/25/2020: Humira 40 mg sq injections every 14 days  [id number]  Okay to refill Humira?

## 2020-08-15 ENCOUNTER — Other Ambulatory Visit: Payer: Self-pay | Admitting: Physician Assistant

## 2020-08-15 NOTE — Telephone Encounter (Addendum)
Last Visit: 05/25/2020 Next Visit: 10/26/2020 Labs: 05/06/2020 WBC is elevated, CMP normal TB Gold: 5/12/201 Neg   Current Dose per office note 05/25/2020: Humira 40 mg sq injections every 14 days  KC:[id number]  Patient advised he due to update labs. Patient states he will come in as soon as he is able.  Okay to refill Humira?

## 2020-08-23 ENCOUNTER — Telehealth: Payer: Self-pay | Admitting: Pharmacist

## 2020-08-23 NOTE — Telephone Encounter (Signed)
.  Received notification from [facility] regarding a prior authorization for HUMIRA. Authorization has already previously been APPROVED from 08/19/20 to 08/19/21.   Authorization # [id number] Phone # [phone]  Chesley Mires, PharmD, MPH Clinical Pharmacist (Rheumatology and Pulmonology)

## 2020-09-07 ENCOUNTER — Telehealth: Payer: Self-pay

## 2020-09-07 DIAGNOSIS — Z9225 Personal history of immunosupression therapy: Secondary | ICD-10-CM

## 2020-09-07 DIAGNOSIS — Z111 Encounter for screening for respiratory tuberculosis: Secondary | ICD-10-CM

## 2020-09-07 NOTE — Telephone Encounter (Signed)
Patient called to let Sue Lush know that the reason he is late getting his labwork is due to his wife testing positive for COVID 2 weeks ago.  Patient states he never got symptoms and tested negative.  Patient states he will come in for labwork before the end of the day on Friday, 09/09/20.

## 2020-09-07 NOTE — Telephone Encounter (Signed)
Noted  

## 2020-09-09 ENCOUNTER — Other Ambulatory Visit: Payer: Self-pay | Admitting: *Deleted

## 2020-09-09 DIAGNOSIS — Z79899 Other long term (current) drug therapy: Secondary | ICD-10-CM

## 2020-09-10 LAB — CBC WITH DIFFERENTIAL/PLATELET
Absolute Monocytes: 605 cells/uL (ref 200–950)
Basophils Absolute: 56 cells/uL (ref 0–200)
Basophils Relative: 0.5 %
Eosinophils Absolute: 381 cells/uL (ref 15–500)
Eosinophils Relative: 3.4 %
HCT: 47.6 % (ref 38.5–50.0)
Hemoglobin: 16.9 g/dL (ref 13.2–17.1)
Lymphs Abs: 1870 cells/uL (ref 850–3900)
MCH: 32.8 pg (ref 27.0–33.0)
MCHC: 35.5 g/dL (ref 32.0–36.0)
MCV: 92.4 fL (ref 80.0–100.0)
MPV: 11.4 fL (ref 7.5–12.5)
Monocytes Relative: 5.4 %
Neutro Abs: 8288 cells/uL — ABNORMAL HIGH (ref 1500–7800)
Neutrophils Relative %: 74 %
Platelets: 245 10*3/uL (ref 140–400)
RBC: 5.15 10*6/uL (ref 4.20–5.80)
RDW: 12.6 % (ref 11.0–15.0)
Total Lymphocyte: 16.7 %
WBC: 11.2 10*3/uL — ABNORMAL HIGH (ref 3.8–10.8)

## 2020-09-10 LAB — COMPLETE METABOLIC PANEL WITH GFR
AG Ratio: 2.1 (calc) (ref 1.0–2.5)
ALT: 22 U/L (ref 9–46)
AST: 19 U/L (ref 10–40)
Albumin: 4.5 g/dL (ref 3.6–5.1)
Alkaline phosphatase (APISO): 82 U/L (ref 36–130)
BUN: 19 mg/dL (ref 7–25)
CO2: 28 mmol/L (ref 20–32)
Calcium: 9.5 mg/dL (ref 8.6–10.3)
Chloride: 104 mmol/L (ref 98–110)
Creat: 1.07 mg/dL (ref 0.60–1.35)
GFR, Est African American: 98 mL/min/{1.73_m2} (ref >=60)
GFR, Est Non African American: 85 mL/min/{1.73_m2} (ref >=60)
Globulin: 2.1 g/dL (calc) (ref 1.9–3.7)
Glucose, Bld: 89 mg/dL (ref 65–99)
Potassium: 4 mmol/L (ref 3.5–5.3)
Sodium: 140 mmol/L (ref 135–146)
Total Bilirubin: 0.7 mg/dL (ref 0.2–1.2)
Total Protein: 6.6 g/dL (ref 6.1–8.1)

## 2020-09-10 NOTE — Progress Notes (Signed)
White cell count is mildly elevated and stable.  CMP is normal.

## 2020-09-19 ENCOUNTER — Other Ambulatory Visit: Payer: Self-pay | Admitting: Rheumatology

## 2020-09-19 NOTE — Telephone Encounter (Signed)
Last Visit: 05/25/2020 Next Visit: 10/25/2020 Labs: 09/09/2020, White cell count is mildly elevated and stable. CMP is normal. TB Gold: 12/23/2019, negative  Current Dose per office note 05/25/2020, Humira 40 mg sq injections every 14 days  ZW:[id number] Last Fill: 08/15/2020  Okay to refill Humira?

## 2020-10-12 NOTE — Progress Notes (Deleted)
Office Visit Note  Patient: Dennis Todd             Date of Birth: Jul 10, 1976           MRN: [id number]             PCP: Medicine, [facility] Referring: Medicine, [facility]* Visit Date: 10/26/2020 Occupation: _0 @  Subjective:  No chief complaint on file.   History of Present Illness: Dennis Todd is a 45 y.o. male ***   Activities of Daily Living:  Patient reports morning stiffness for *** {minute/hour:19697}.   Patient {ACTIONS;DENIES/REPORTS:21021675::"Denies"} nocturnal pain.  Difficulty dressing/grooming: {ACTIONS;DENIES/REPORTS:21021675::"Denies"} Difficulty climbing stairs: {ACTIONS;DENIES/REPORTS:21021675::"Denies"} Difficulty getting out of chair: {ACTIONS;DENIES/REPORTS:21021675::"Denies"} Difficulty using hands for taps, buttons, cutlery, and/or writing: {ACTIONS;DENIES/REPORTS:21021675::"Denies"}  No Rheumatology ROS completed.   PMFS History:  Patient Active Problem List   Diagnosis Date Noted  . History of migraine 12/19/2016  . High risk medication use 10/20/2016  . Personal history of chronic sinusitis 10/20/2016  . Tinea versicolor 10/20/2016  . 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 Arthritis   . Migraine     Family History  Problem Relation Age of Onset  . Diabetes Father   . Rheum arthritis Brother   . Healthy Daughter    Past Surgical History:  Procedure Laterality Date  . ANKLE SURGERY Left   . back injections    . NASAL SINUS SURGERY     Social History   Social History Narrative  . Not on file   Immunization History  Administered Date(s) Administered  . Influenza,inj,Quad PF,6+ Mos 07/21/2015  . Influenza-Unspecified 09/27/2016     Objective: Vital Signs: There were no vitals taken for this visit.   Physical Exam   Musculoskeletal Exam: ***  CDAI Exam: CDAI Score: -- Patient Global: --; Provider Global:  -- Swollen: --; Tender: -- Joint Exam 10/26/2020   No joint exam has been documented for this visit   There is currently no information documented on the homunculus. Go to the Rheumatology activity and complete the homunculus joint exam.  Investigation: No additional findings.  Imaging: No results found.  Recent Labs: Lab Results  Component Value Date   WBC 11.2 (H) 09/09/2020   HGB 16.9 09/09/2020   PLT 245 09/09/2020   NA 140 09/09/2020   K 4.0 09/09/2020   CL 104 09/09/2020   CO2 28 09/09/2020   GLUCOSE 89 09/09/2020   BUN 19 09/09/2020   CREATININE 1.07 09/09/2020   BILITOT 0.7 09/09/2020   ALKPHOS 96 04/02/2017   AST 19 09/09/2020   ALT 22 09/09/2020   PROT 6.6 09/09/2020   ALBUMIN 4.5 04/02/2017   CALCIUM 9.5 09/09/2020   GFRAA 98 09/09/2020   QFTBGOLDPLUS NEGATIVE 12/23/2019    Speciality Comments: No specialty comments available.  Procedures:  No procedures performed Allergies: Patient has no known allergies.   Assessment / Plan:     Visit Diagnoses: No diagnosis found.  Orders: No orders of the defined types were placed in this encounter.  No orders of the defined types were placed in this encounter.   Face-to-face time spent with patient was *** minutes. Greater than 50% of time was spent in counseling and coordination of care.  Follow-Up Instructions: No follow-ups on file.   Earnestine Mealing, CMA  Note - This record has been created using Editor, commissioning.  Chart creation errors have been sought, but may  not always  have been located. Such creation errors do not reflect on  the standard of medical care.

## 2020-10-26 ENCOUNTER — Ambulatory Visit: Payer: 59 | Admitting: Rheumatology

## 2020-10-26 DIAGNOSIS — G8929 Other chronic pain: Secondary | ICD-10-CM

## 2020-10-26 DIAGNOSIS — Z79899 Other long term (current) drug therapy: Secondary | ICD-10-CM

## 2020-10-26 DIAGNOSIS — M25511 Pain in right shoulder: Secondary | ICD-10-CM

## 2020-10-26 DIAGNOSIS — Z8669 Personal history of other diseases of the nervous system and sense organs: Secondary | ICD-10-CM

## 2020-10-26 DIAGNOSIS — Z8709 Personal history of other diseases of the respiratory system: Secondary | ICD-10-CM

## 2020-10-26 DIAGNOSIS — Z1589 Genetic susceptibility to other disease: Secondary | ICD-10-CM

## 2020-10-26 DIAGNOSIS — B360 Pityriasis versicolor: Secondary | ICD-10-CM

## 2020-10-26 DIAGNOSIS — E559 Vitamin D deficiency, unspecified: Secondary | ICD-10-CM

## 2020-10-26 DIAGNOSIS — M47819 Spondylosis without myelopathy or radiculopathy, site unspecified: Secondary | ICD-10-CM

## 2020-10-26 DIAGNOSIS — M545 Low back pain, unspecified: Secondary | ICD-10-CM

## 2020-10-27 NOTE — Progress Notes (Signed)
Office Visit Note  Patient: Dennis Todd             Date of Birth: Aug 16, 1975           MRN: [id number]             PCP: Medicine, [facility] Referring: Medicine, [place]* Visit Date: 10/28/2020 Occupation: '@GUAROCC' @  Subjective:  Left hand pain   History of Present Illness: Dennis Todd is a 45 y.o. male with history of spondyloarthropathy.  He is currently on humira 40 mg sq injections every 14 days.  He has not missed any doses of humira recently.  He states over the past 2 months he has been experiencing intermittent pain in the left hand over the 3rd and 4th MCPs and top of his hand.  He denies any joint swelling.  He states the pain is exacerbated by turning doorknobs.  He states he did not have any injury prior to the onset of symptoms. He states he established care with a physical therapist for management of lateral epicondylitis of the left elbow and underwent dry needling, which improved his symptoms significantly.  He reports about 3 months ago he underwent a spinal ablation performed by Dr. Francesco Runner, which has improved his lower back pain significantly.  He was given a prescription for tramadol 50 mg TID PRN for pain relief, which he has been taking sparingly for pain relief.  He continues to have occasional SI joint discomfort but no symptoms of radiculopathy. He denies any other joint pain or joint swelling at this time.  He denies any achilles tendonitis or plantar fasciitis.  He denies any recent infections.    Activities of Daily Living:  Patient reports morning stiffness for 30 minutes.   Patient Denies nocturnal pain.  Difficulty dressing/grooming: Denies Difficulty climbing stairs: Denies Difficulty getting out of chair: Denies Difficulty using hands for taps, buttons, cutlery, and/or writing: Reports  Review of Systems  Constitutional: Negative for fatigue.  HENT: Negative for mouth dryness.   Eyes: Negative for dryness.   Respiratory: Negative for shortness of breath.   Cardiovascular: Negative for swelling in legs/feet.  Gastrointestinal: Negative for constipation.  Endocrine: Negative for excessive thirst.  Genitourinary: Negative for difficulty urinating.  Musculoskeletal: Positive for arthralgias, joint pain and morning stiffness.  Skin: Negative for rash.  Allergic/Immunologic: Negative for susceptible to infections.  Neurological: Negative for numbness.  Hematological: Negative for bruising/bleeding tendency.  Psychiatric/Behavioral: Negative for sleep disturbance.    PMFS History:  Patient Active Problem List   Diagnosis Date Noted  . History of migraine 12/19/2016  . High risk medication use 10/20/2016  . Personal history of chronic sinusitis 10/20/2016  . Tinea versicolor 10/20/2016  . 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 Arthritis   . Migraine     Family History  Problem Relation Age of Onset  . Diabetes Father   . Rheum arthritis Brother   . Healthy Daughter    Past Surgical History:  Procedure Laterality Date  . ANKLE SURGERY Left   . back injections    . NASAL SINUS SURGERY     Social History   Social History Narrative  . Not on file   Immunization History  Administered Date(s) Administered  . Influenza,inj,Quad PF,6+ Mos 07/21/2015  . Influenza-Unspecified 09/27/2016     Objective: Vital Signs: BP 109/67 (BP Location: Left Arm, Patient Position: Sitting, Cuff Size:  Normal)   Pulse 75   Resp 15   Ht '5\' 9"'  (1.753 m)   Wt 206 lb (93.4 kg)   BMI 30.42 kg/m    Physical Exam Vitals and nursing note reviewed.  Constitutional:      Appearance: He is well-developed.  HENT:     Head: Normocephalic and atraumatic.  Eyes:     Conjunctiva/sclera: Conjunctivae normal.     Pupils: Pupils are equal, round, and reactive to light.  Pulmonary:     Effort: Pulmonary effort is normal.   Abdominal:     Palpations: Abdomen is soft.  Musculoskeletal:     Cervical back: Normal range of motion and neck supple.  Skin:    General: Skin is warm and dry.     Capillary Refill: Capillary refill takes less than 2 seconds.  Neurological:     Mental Status: He is alert and oriented to person, place, and time.  Psychiatric:        Behavior: Behavior normal.      Musculoskeletal Exam: C-spine, thoracic spine, and lumbar spine good ROM.  No midline spinal tenderness.  Mild SI joint tenderness bilaterally. Shoulder joints, elbow joints, wrist joints, MCPs, PIPs, and DIPs good ROM with no synovitis.  Tenderness over the left 3rd and 4th MCP joints but no synovitis noted.  Hip joints, knee joints, and ankle joints good ROM with no discomfort.  No warmth or effusion of knee joints.  No tenderness or swelling of ankle joints.  CDAI Exam: CDAI Score: -- Patient Global: --; Provider Global: -- Swollen: --; Tender: -- Joint Exam 10/28/2020   No joint exam has been documented for this visit   There is currently no information documented on the homunculus. Go to the Rheumatology activity and complete the homunculus joint exam.  Investigation: No additional findings.  Imaging: No results found.  Recent Labs: Lab Results  Component Value Date   WBC 11.2 (H) 09/09/2020   HGB 16.9 09/09/2020   PLT 245 09/09/2020   NA 140 09/09/2020   K 4.0 09/09/2020   CL 104 09/09/2020   CO2 28 09/09/2020   GLUCOSE 89 09/09/2020   BUN 19 09/09/2020   CREATININE 1.07 09/09/2020   BILITOT 0.7 09/09/2020   ALKPHOS 96 04/02/2017   AST 19 09/09/2020   ALT 22 09/09/2020   PROT 6.6 09/09/2020   ALBUMIN 4.5 04/02/2017   CALCIUM 9.5 09/09/2020   GFRAA 98 09/09/2020   QFTBGOLDPLUS NEGATIVE 12/23/2019    Speciality Comments: No specialty comments available.  Procedures:  No procedures performed Allergies: Patient has no known allergies.    Assessment / Plan:     Visit Diagnoses:  Spondyloarthritis: He has no synovitis or dactylitis on exam.  He has not had any recent flares.  He continues to experience occasional discomfort in both SI joints but overall his discomfort and stiffness has been tolerable.  According to the patient he had a spinal nerve ablation by Dr. Francesco Runner about 3 months ago which improved his lower back pain significantly.  He has no midline spinal tenderness on examination today.  He has not been experiencing any nocturnal pain.  He has been experiencing some increased discomfort in the left hand especially over the left third and fourth MCP joints.  No synovitis was noted on examination today.  He declined updated x-rays at this time.  Overall he is clinically doing well on Humira 40 mg subcutaneous injections every 14 days.  He has not missed any doses recently and  is tolerating it without any side effects or recent infections.  He will continue on the current treatment regimen.  He was advised to notify us if he develops any new or worsening symptoms.  He will follow-up in the office in 5 months.  HLA B27 (HLA B27 positive)  High risk medication use - Humira 40 mg sq injections every 14 days.  CBC and CMP updated on 09/09/20.  He is due to update lab work in April and every 3 months. Standing orders for CBC and CMP remain in place. TB gold negative on 12/23/19.  Future order for TB gold placed today.   - Plan: QuantiFERON-TB Gold Plus He has not had any recent infections.  Advised to hold Humira if he develops signs or symptoms of an infection and to resume once the infection has completely cleared. He has not received the COVID-19 vaccine and does not plan to.  Screening for tuberculosis -TB gold negative on 12/23/2019.  Future order was placed today.  Plan: QuantiFERON-TB Gold Plus  Left hand pain: He has been experiencing intermittent pain in the left third and fourth MCP joints for the past 2 months.  He had no tenderness or synovitis on examination today.  He  discomfort has been exacerbated by tasks such as turning a doorknob.  He declined updated x-rays of the left hand today.  We discussed the use of Voltaren gel which she can apply topically as needed for pain relief.  He is advised to notify us if the discomfort persists or worsens.  Chronic SI joint pain: He continues to experience intermittent SI joint discomfort.  He has mild tenderness palpation on examination today.  Overall his lower back pain and stiffness has been tolerable.  Other medical conditions are listed as well  Vitamin D deficiency  Personal history of chronic sinusitis  Tinea versicolor  History of migraine  Other fatigue  Orders: Orders Placed This Encounter  Procedures  . QuantiFERON-TB Gold Plus   No orders of the defined types were placed in this encounter.     Follow-Up Instructions: Return in about 5 months (around 03/30/2021) for Spondyloarthropathy.   Ofilia Neas, PA-C  Note - This record has been created using Dragon software.  Chart creation errors have been sought, but may not always  have been located. Such creation errors do not reflect on  the standard of medical care.

## 2020-10-28 ENCOUNTER — Encounter: Payer: Self-pay | Admitting: Physician Assistant

## 2020-10-28 ENCOUNTER — Other Ambulatory Visit: Payer: Self-pay

## 2020-10-28 ENCOUNTER — Ambulatory Visit (INDEPENDENT_AMBULATORY_CARE_PROVIDER_SITE_OTHER): Payer: 59 | Admitting: Physician Assistant

## 2020-10-28 VITALS — BP 109/67 | HR 75 | Resp 15 | Ht 69.0 in | Wt 206.0 lb

## 2020-10-28 DIAGNOSIS — Z1589 Genetic susceptibility to other disease: Secondary | ICD-10-CM

## 2020-10-28 DIAGNOSIS — M47819 Spondylosis without myelopathy or radiculopathy, site unspecified: Secondary | ICD-10-CM

## 2020-10-28 DIAGNOSIS — M79642 Pain in left hand: Secondary | ICD-10-CM

## 2020-10-28 DIAGNOSIS — Z79899 Other long term (current) drug therapy: Secondary | ICD-10-CM

## 2020-10-28 DIAGNOSIS — Z111 Encounter for screening for respiratory tuberculosis: Secondary | ICD-10-CM | POA: Diagnosis not present

## 2020-10-28 DIAGNOSIS — E559 Vitamin D deficiency, unspecified: Secondary | ICD-10-CM

## 2020-10-28 DIAGNOSIS — Z8669 Personal history of other diseases of the nervous system and sense organs: Secondary | ICD-10-CM

## 2020-10-28 DIAGNOSIS — R5383 Other fatigue: Secondary | ICD-10-CM

## 2020-10-28 DIAGNOSIS — Z8709 Personal history of other diseases of the respiratory system: Secondary | ICD-10-CM

## 2020-10-28 DIAGNOSIS — M533 Sacrococcygeal disorders, not elsewhere classified: Secondary | ICD-10-CM

## 2020-10-28 DIAGNOSIS — G8929 Other chronic pain: Secondary | ICD-10-CM

## 2020-10-28 DIAGNOSIS — B360 Pityriasis versicolor: Secondary | ICD-10-CM

## 2020-10-28 NOTE — Patient Instructions (Signed)
Standing Labs We placed an order today for your standing lab work.   Please have your standing labs drawn in April and every 3 months   If possible, please have your labs drawn 2 weeks prior to your appointment so that the provider can discuss your results at your appointment.  We have open lab daily Monday through Thursday from 1:30-4:30 PM and Friday from 1:30-4:00 PM at the office of Dr. Shaili Deveshwar, [facility].   Please be advised, all patients with office appointments requiring lab work will take precedents over walk-in lab work.  If possible, please come for your lab work on Monday and Friday afternoons, as you may experience shorter wait times. The office is located at [address], [address], [place], [place] [address] No appointment is necessary.   Labs are drawn by [facility]. Please bring your co-pay at the time of your lab draw.  You may receive a bill from [facility] for your lab work.  If you wish to have your labs drawn at another location, please call the office 24 hours in advance to send orders.  If you have any questions regarding directions or hours of operation,  please call [phone].   As a reminder, please drink plenty of water prior to coming for your lab work. Thanks!   

## 2020-12-06 ENCOUNTER — Other Ambulatory Visit: Payer: Self-pay | Admitting: Physician Assistant

## 2020-12-06 NOTE — Telephone Encounter (Signed)
Next Visit: 04/05/2021  Last Visit:  10/28/2020  Last Fill: 09/19/2020  DX:  Spondyloarthritis  Current Dose per office note 3.18/2022, Humira 40 mg sq injections every 14 days  Labs: 09/09/2020, White cell count is mildly elevated and stable. CMP is normal.  TB Gold: 12/23/2019, negative  I called patient to have labs drawn.  Okay to refill Humira?

## 2021-02-28 ENCOUNTER — Other Ambulatory Visit: Payer: Self-pay | Admitting: Physician Assistant

## 2021-03-22 NOTE — Progress Notes (Signed)
Office Visit Note  Patient: Dennis Todd             Date of Birth: 1976/03/23           MRN: [id number]             PCP: Medicine, [facility] Referring: Medicine, [facility]* Visit Date: 04/05/2021 Occupation: '@GUAROCC' @  Subjective:  Lower back pain.   History of Present Illness: HAWKIN CHARO is a 45 y.o. male with a history of a spondyloarthropathy.  He states recently has been experiencing increased lower back pain.  He had radiofrequency ablation about 6 months ago which was effective.  He had another radiofrequency ablation in July which was not effective.  He complains of pain and discomfort in his bilateral SI joints.  He also has some discomfort in his left hip and right shoulder which she relates to his work.  He denies any history of plantar fasciitis, Achilles tendinitis or iritis. None of the other joints are swollen.  Activities of Daily Living:  Patient reports morning stiffness for all day. Patient Reports nocturnal pain.  Difficulty dressing/grooming: Denies Difficulty climbing stairs: Denies Difficulty getting out of chair: Reports Difficulty using hands for taps, buttons, cutlery, and/or writing: Reports  Review of Systems  Constitutional:  Negative for fatigue.  HENT:  Negative for mouth sores, mouth dryness and nose dryness.   Eyes:  Negative for pain, itching and dryness.  Respiratory:  Negative for shortness of breath and difficulty breathing.   Cardiovascular:  Negative for chest pain and palpitations.  Gastrointestinal:  Negative for blood in stool, constipation and diarrhea.  Endocrine: Negative for increased urination.  Genitourinary:  Negative for difficulty urinating.  Musculoskeletal:  Positive for joint pain, joint pain, myalgias, morning stiffness, muscle tenderness and myalgias. Negative for joint swelling.  Skin:  Negative for color change, rash and redness.  Allergic/Immunologic: Negative for susceptible to  infections.  Neurological:  Negative for dizziness, numbness, headaches, memory loss and weakness.  Hematological:  Negative for bruising/bleeding tendency.  Psychiatric/Behavioral:  Negative for confusion.    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   HLA B27 (HLA B27 positive) 10/12/2016    Past Medical History:  Diagnosis Date   Arthritis    Migraine     Family History  Problem Relation Age of Onset   Diabetes Father    Rheum arthritis Brother    Healthy Daughter    Past Surgical History:  Procedure Laterality Date   ANKLE SURGERY Left    back ablation     back injections     NASAL SINUS SURGERY     Social History   Social History Narrative   Not on file   Immunization History  Administered Date(s) Administered   Influenza,inj,Quad PF,6+ Mos 07/21/2015   Influenza-Unspecified 09/27/2016     Objective: Vital Signs: BP (!) 143/84 (BP Location: Left Arm, Patient Position: Sitting, Cuff Size: Normal)   Pulse 60   Ht '5\' 9"'  (1.753 m)   Wt 203 lb (92.1 kg)   BMI 29.98 kg/m    Physical Exam Vitals and nursing note reviewed.  Constitutional:      Appearance: He is well-developed.  HENT:     Head: Normocephalic and atraumatic.  Eyes:     Conjunctiva/sclera:  Conjunctivae normal.     Pupils: Pupils are equal, round, and reactive to light.  Cardiovascular:     Rate and Rhythm: Normal rate and regular rhythm.     Heart sounds: Normal heart sounds.  Pulmonary:     Effort: Pulmonary effort is normal.     Breath sounds: Normal breath sounds.  Abdominal:     General: Bowel sounds are normal.     Palpations: Abdomen is soft.  Musculoskeletal:     Cervical back: Normal range of motion and neck supple.  Skin:    General: Skin is warm and dry.     Capillary  Refill: Capillary refill takes less than 2 seconds.  Neurological:     Mental Status: He is alert and oriented to person, place, and time.  Psychiatric:        Behavior: Behavior normal.     Musculoskeletal Exam: C-spine was in good range of motion.  He had no point tenderness over the thoracic region.  He had comfort on palpation of the lumbar spine.  He tenderness over past bilateral SI joints.  Shoulder joints were in good range of motion.  Elbow joints, wrist joints, MCPs PIPs and DIPs with good range of motion.  He had mild tenderness over the right subacromial region.  Hip joints, knee joints, ankles, MTPs with good range of motion.  He had no tenderness over plantar fascia or Achilles tendon.  CDAI Exam: CDAI Score: -- Patient Global: --; Provider Global: -- Swollen: --; Tender: -- Joint Exam 04/05/2021   No joint exam has been documented for this visit   There is currently no information documented on the homunculus. Go to the Rheumatology activity and complete the homunculus joint exam.  Investigation: No additional findings.  Imaging: No results found.  Recent Labs: Lab Results  Component Value Date   WBC 11.2 (H) 09/09/2020   HGB 16.9 09/09/2020   PLT 245 09/09/2020   NA 140 09/09/2020   K 4.0 09/09/2020   CL 104 09/09/2020   CO2 28 09/09/2020   GLUCOSE 89 09/09/2020   BUN 19 09/09/2020   CREATININE 1.07 09/09/2020   BILITOT 0.7 09/09/2020   ALKPHOS 96 04/02/2017   AST 19 09/09/2020   ALT 22 09/09/2020   PROT 6.6 09/09/2020   ALBUMIN 4.5 04/02/2017   CALCIUM 9.5 09/09/2020   GFRAA 98 09/09/2020   QFTBGOLDPLUS NEGATIVE 12/23/2019    Speciality Comments:   FU every 3 months  Procedures:  Sacroiliac Joint Inj on 04/05/2021 8:31 AM Indications: pain Details: 27 G 1.5 in posterior Medications (Right): 1 mL lidocaine 1 %; 40 mg triamcinolone acetonide 40 MG/ML Aspirate (Right): 0 mL Medications (Left): 1 mL lidocaine 1 %; 40 mg triamcinolone acetonide 40  MG/ML Aspirate (Left): 0 mL Outcome: tolerated well, no immediate complications   Allergies: Patient has no known allergies.   Assessment / Plan:     Visit Diagnoses: Spondyloarthritis-he continues to have some lower back pain.  He also complains of SI joint pain.  He denies any recent episodes of plantar fasciitis, Achilles tendinitis or iritis.  We discussed to monitor response to Humira over the next few months.  We also discussed possibility of switching him to other treatment options.  HLA B27 (HLA B27 positive)  High risk medication use - Humira 40 mg sq injections every 14 days. - Plan: CBC with Differential/Platelet, COMPLETE METABOLIC PANEL WITH GFR, QuantiFERON-TB Gold Plus today.  He has been noncompliant with the labs.  He would prefer  to have labs with each visit.  We will try to schedule visits every 3 months.  He has been advised to get labs every 3 months.  He was also advised to get annual skin examination to screen for nonmelanoma skin cancer while he is on Humira.  He was advised to stop Humira in case he develops an infection and restart after the infection resolves.  Updated information regarding immunization was also placed in the AVS.  Chronic right shoulder pain-he has been having some discomfort in the right shoulder which appears to be secondary to subacromial bursitis.  Some stretching exercises were discussed.  He had out on shoulder exercises was given.  Chronic SI joint pain -he has been having pain and discomfort in his bilateral SI joints.  Per his request after informed consent was obtained bilateral SI joint were injected with the plan: Ambulatory referral to Physical Therapy  DDD (degenerative disc disease), lumbar-he has been having lower back pain.  He has been followed by back specialist.  He had radiofrequency ablation x2.  The last 1 was in July which was not very effective.  He has some point tenderness in the lower lumbar region.  He would benefit from  physical therapy  Personal history of chronic sinusitis-he has not had sinus infection in a while.  Vitamin D deficiency-  History of migraine  Tinea versicolor-no active lesions today.  Orders: Orders Placed This Encounter  Procedures   Sacroiliac Joint Inj   CBC with Differential/Platelet   COMPLETE METABOLIC PANEL WITH GFR   QuantiFERON-TB Gold Plus   Ambulatory referral to Physical Therapy   No orders of the defined types were placed in this encounter.   Follow-Up Instructions: Return in about 3 months (around 07/06/2021) for Spondyloarthropathy.   [name], MD  Note - This record has been created using Editor, commissioning.  Chart creation errors have been sought, but may not always  have been located. Such creation errors do not reflect on  the standard of medical care.

## 2021-04-05 ENCOUNTER — Ambulatory Visit (INDEPENDENT_AMBULATORY_CARE_PROVIDER_SITE_OTHER): Payer: 59 | Admitting: Rheumatology

## 2021-04-05 ENCOUNTER — Encounter: Payer: Self-pay | Admitting: Rheumatology

## 2021-04-05 ENCOUNTER — Other Ambulatory Visit: Payer: Self-pay

## 2021-04-05 VITALS — BP 143/84 | HR 60 | Ht 69.0 in | Wt 203.0 lb

## 2021-04-05 DIAGNOSIS — Z8709 Personal history of other diseases of the respiratory system: Secondary | ICD-10-CM

## 2021-04-05 DIAGNOSIS — M5136 Other intervertebral disc degeneration, lumbar region: Secondary | ICD-10-CM

## 2021-04-05 DIAGNOSIS — B360 Pityriasis versicolor: Secondary | ICD-10-CM

## 2021-04-05 DIAGNOSIS — M533 Sacrococcygeal disorders, not elsewhere classified: Secondary | ICD-10-CM | POA: Diagnosis not present

## 2021-04-05 DIAGNOSIS — R5383 Other fatigue: Secondary | ICD-10-CM

## 2021-04-05 DIAGNOSIS — G8929 Other chronic pain: Secondary | ICD-10-CM

## 2021-04-05 DIAGNOSIS — M47819 Spondylosis without myelopathy or radiculopathy, site unspecified: Secondary | ICD-10-CM | POA: Diagnosis not present

## 2021-04-05 DIAGNOSIS — M79642 Pain in left hand: Secondary | ICD-10-CM

## 2021-04-05 DIAGNOSIS — Z8669 Personal history of other diseases of the nervous system and sense organs: Secondary | ICD-10-CM

## 2021-04-05 DIAGNOSIS — M51369 Other intervertebral disc degeneration, lumbar region without mention of lumbar back pain or lower extremity pain: Secondary | ICD-10-CM

## 2021-04-05 DIAGNOSIS — M25511 Pain in right shoulder: Secondary | ICD-10-CM

## 2021-04-05 DIAGNOSIS — E559 Vitamin D deficiency, unspecified: Secondary | ICD-10-CM

## 2021-04-05 DIAGNOSIS — Z1589 Genetic susceptibility to other disease: Secondary | ICD-10-CM

## 2021-04-05 DIAGNOSIS — Z79899 Other long term (current) drug therapy: Secondary | ICD-10-CM | POA: Diagnosis not present

## 2021-04-05 MED ORDER — TRIAMCINOLONE ACETONIDE 40 MG/ML IJ SUSP
40.0000 mg | INTRAMUSCULAR | Status: AC | PRN
  Administered 2021-04-05: 40 mg via INTRA_ARTICULAR

## 2021-04-05 MED ORDER — LIDOCAINE HCL 1 % IJ SOLN
1.0000 mL | INTRAMUSCULAR | Status: AC | PRN
  Administered 2021-04-05: 1 mL

## 2021-04-05 MED ORDER — LIDOCAINE HCL 1 % IJ SOLN
1.0000 mL | INTRAMUSCULAR | Status: AC | PRN
Start: 2021-04-05 — End: 2021-04-05
  Administered 2021-04-05: 1 mL

## 2021-04-05 NOTE — Patient Instructions (Addendum)
Standing Labs We placed an order today for your standing lab work.   Please have your standing labs drawn in November and every 3 months  If possible, please have your labs drawn 2 weeks prior to your appointment so that the provider can discuss your results at your appointment.  Please note that you may see your imaging and lab results in MyChart before we have reviewed them. We may be awaiting multiple results to interpret others before contacting you. Please allow our office up to 72 hours to thoroughly review all of the results before contacting the office for clarification of your results.  We have open lab daily: Monday through Thursday from 1:30-4:30 PM and Friday from 1:30-4:00 PM at the office of Dr. Pollyann Savoy, [facility].   Please be advised, all patients with office appointments requiring lab work will take precedent over walk-in lab work.  If possible, please come for your lab work on Monday and Friday afternoons, as you may experience shorter wait times. The office is located at [address], [place], [place] [address] No appointment is necessary.   Labs are drawn by [facility]. Please bring your co-pay at the time of your lab draw.  You may receive a bill from [facility] for your lab work.  If you wish to have your labs drawn at another location, please call the office 24 hours in advance to send orders.  If you have any questions regarding directions or hours of operation,  please call [phone].   As a reminder, please drink plenty of water prior to coming for your lab work. Thanks!   Please get annual skin examination by dermatologist to screen for nonmelanoma skin cancer while you are on Humira  If you test POSITIVE for COVID19 and have MILD to MODERATE symptoms: First, call your PCP if you would like to receive COVID19 treatment AND Hold your medications during the infection and for at least 1 week after your symptoms have  resolved: Injectable medication (Benlysta, Cimzia, Cosentyx, Enbrel, Humira, Orencia, Remicade, Simponi, Stelara, Taltz, Tremfya) Methotrexate Leflunomide (Arava) Azathioprine Mycophenolate (Cellcept) Osborne Oman, or Rinvoq Otezla If you take Actemra or Kevzara, you DO NOT need to hold these for COVID19 infection.  If you test POSITIVE for COVID19 and have NO symptoms: First, call your PCP if you would like to receive COVID19 treatment AND Hold your medications for at least 10 days after the day that you tested positive Injectable medication (Benlysta, Cimzia, Cosentyx, Enbrel, Humira, Orencia, Remicade, Simponi, Stelara, Taltz, Tremfya) Methotrexate Leflunomide (Arava) Azathioprine Mycophenolate (Cellcept) Osborne Oman, or Rinvoq Otezla If you take Actemra or Kevzara, you DO NOT need to hold these for COVID19 infection.  If you have signs or symptoms of an infection or start antibiotics: First, call your PCP for workup of your infection. Hold your medication through the infection, until you complete your antibiotics, and until symptoms resolve if you take the following: Injectable medication (Actemra, Benlysta, Cimzia, Cosentyx, Enbrel, Humira, Kevzara, Orencia, Remicade, Simponi, Stelara, Taltz, Tremfya) Methotrexate Leflunomide (Arava) Mycophenolate (Cellcept) Harriette Ohara, Olumiant, or Rinvoq  Vaccines You are taking a medication(s) that can suppress your immune system.  The following immunizations are recommended: Flu annually Covid-19  Td/Tdap (tetanus, diphtheria, pertussis) every 10 years Pneumonia (Prevnar 15 then Pneumovax 23 at least 1 year apart.  Alternatively, can take Prevnar 20 without needing additional dose) Shingrix (after age 55): 2 doses from 4 weeks to 6 months apart  Please check with your PCP to make sure  you are up to date.  Heart Disease Prevention   Your inflammatory disease increases your risk of heart disease which includes heart attack,  stroke, atrial fibrillation (irregular heartbeats), high blood pressure, heart failure and atherosclerosis (plaque in the arteries).  It is important to reduce your risk by:   Keep blood pressure, cholesterol, and blood sugar at healthy levels   Smoking Cessation   Maintain a healthy weight  BMI 20-25   Eat a healthy diet  Plenty of fresh fruit, vegetables, and whole grains  Limit saturated fats, foods high in sodium, and added sugars  DASH and Mediterranean diet   Increase physical activity  Recommend moderate physically activity for 150 minutes per week/ 30 minutes a day for five days a week These can be broken up into three separate ten-minute sessions during the day.   Reduce Stress  Meditation, slow breathing exercises, yoga, coloring books  Dental visits twice a year   Back Exercises The following exercises strengthen the muscles that help to support the trunk and back. They also help to keep the lower back flexible. Doing these exercises can help to prevent back pain or lessen existing pain. If you have back pain or discomfort, try doing these exercises 2-3 times each day or as told by your health care provider. As your pain improves, do them once each day, but increase the number of times that you repeat the steps for each exercise (do more repetitions). To prevent the recurrence of back pain, continue to do these exercises once each day or as told by your health care provider. Do exercises exactly as told by your health care provider and adjust them as directed. It is normal to feel mild stretching, pulling, tightness, or discomfort as you do these exercises, but you should stop right away if youfeel sudden pain or your pain gets worse. Exercises Single knee to chest Repeat these steps 3-5 times for each leg: Lie on your back on a firm bed or the floor with your legs extended. Bring one knee to your chest. Your other leg should stay extended and in contact with the floor. Hold  your knee in place by grabbing your knee or thigh with both hands and hold. Pull on your knee until you feel a gentle stretch in your lower back or buttocks. Hold the stretch for 10-30 seconds. Slowly release and straighten your leg. Pelvic tilt Repeat these steps 5-10 times: Lie on your back on a firm bed or the floor with your legs extended. Bend your knees so they are pointing toward the ceiling and your feet are flat on the floor. Tighten your lower abdominal muscles to press your lower back against the floor. This motion will tilt your pelvis so your tailbone points up toward the ceiling instead of pointing to your feet or the floor. With gentle tension and even breathing, hold this position for 5-10 seconds. Cat-cow Repeat these steps until your lower back becomes more flexible: Get into a hands-and-knees position on a firm surface. Keep your hands under your shoulders, and keep your knees under your hips. You may place padding under your knees for comfort. Let your head hang down toward your chest. Contract your abdominal muscles and point your tailbone toward the floor so your lower back becomes rounded like the back of a cat. Hold this position for 5 seconds. Slowly lift your head, let your abdominal muscles relax and point your tailbone up toward the ceiling so your back forms a sagging  arch like the back of a cow. Hold this position for 5 seconds.  Press-ups Repeat these steps 5-10 times: Lie on your abdomen (face-down) on the floor. Place your palms near your head, about shoulder-width apart. Keeping your back as relaxed as possible and keeping your hips on the floor, slowly straighten your arms to raise the top half of your body and lift your shoulders. Do not use your back muscles to raise your upper torso. You may adjust the placement of your hands to make yourself more comfortable. Hold this position for 5 seconds while you keep your back relaxed. Slowly return to lying flat  on the floor.  Bridges Repeat these steps 10 times: Lie on your back on a firm surface. Bend your knees so they are pointing toward the ceiling and your feet are flat on the floor. Your arms should be flat at your sides, next to your body. Tighten your buttocks muscles and lift your buttocks off the floor until your waist is at almost the same height as your knees. You should feel the muscles working in your buttocks and the back of your thighs. If you do not feel these muscles, slide your feet 1-2 inches farther away from your buttocks. Hold this position for 3-5 seconds. Slowly lower your hips to the starting position, and allow your buttocks muscles to relax completely. If this exercise is too easy, try doing it with your arms crossed over yourchest. Abdominal crunches Repeat these steps 5-10 times: Lie on your back on a firm bed or the floor with your legs extended. Bend your knees so they are pointing toward the ceiling and your feet are flat on the floor. Cross your arms over your chest. Tip your chin slightly toward your chest without bending your neck. Tighten your abdominal muscles and slowly raise your trunk (torso) high enough to lift your shoulder blades a tiny bit off the floor. Avoid raising your torso higher than that because it can put too much stress on your low back and does not help to strengthen your abdominal muscles. Slowly return to your starting position. Back lifts Repeat these steps 5-10 times: Lie on your abdomen (face-down) with your arms at your sides, and rest your forehead on the floor. Tighten the muscles in your legs and your buttocks. Slowly lift your chest off the floor while you keep your hips pressed to the floor. Keep the back of your head in line with the curve in your back. Your eyes should be looking at the floor. Hold this position for 3-5 seconds. Slowly return to your starting position. Contact a health care provider if: Your back pain or  discomfort gets much worse when you do an exercise. Your worsening back pain or discomfort does not lessen within 2 hours after you exercise. If you have any of these problems, stop doing these exercises right away. Do not do them again unless your health care provider says that you can. Get help right away if: You develop sudden, severe back pain. If this happens, stop doing the exercises right away. Do not do them again unless your health care provider says that you can. This information is not intended to replace advice given to you by your health care provider. Make sure you discuss any questions you have with your healthcare provider. Document Revised: 12/04/2018 Document Reviewed: 05/01/2018 Elsevier Patient Education  2022 [employer] Shoulder Exercises Ask your health care provider which exercises are safe for you. Do exercises exactly as  told by your health care provider and adjust them as directed. It is normal to feel mild stretching, pulling, tightness, or discomfort as you do these exercises. Stop right away if you feel sudden pain or your pain gets worse. Do not begin these exercises until told by your health care provider. Stretching exercises External rotation and abduction This exercise is sometimes called corner stretch. This exercise rotates your arm outward (external rotation) and moves your arm out from your body (abduction). Stand in a doorway with one of your feet slightly in front of the other. This is called a staggered stance. If you cannot reach your forearms to the door frame, stand facing a corner of a room. Choose one of the following positions as told by your health care provider: Place your hands and forearms on the door frame above your head. Place your hands and forearms on the door frame at the height of your head. Place your hands on the door frame at the height of your elbows. Slowly move your weight onto your front foot until you feel a stretch across your  chest and in the front of your shoulders. Keep your head and chest upright and keep your abdominal muscles tight. Hold for __________ seconds. To release the stretch, shift your weight to your back foot. Repeat __________ times. Complete this exercise __________ times a day. Extension, standing Stand and hold a broomstick, a cane, or a similar object behind your back. Your hands should be a little wider than shoulder width apart. Your palms should face away from your back. Keeping your elbows straight and your shoulder muscles relaxed, move the stick away from your body until you feel a stretch in your shoulders (extension). Avoid shrugging your shoulders while you move the stick. Keep your shoulder blades tucked down toward the middle of your back. Hold for __________ seconds. Slowly return to the starting position. Repeat __________ times. Complete this exercise __________ times a day. Range-of-motion exercises Pendulum  Stand near a wall or a surface that you can hold onto for balance. Bend at the waist and let your left / right arm hang straight down. Use your other arm to support you. Keep your back straight and do not lock your knees. Relax your left / right arm and shoulder muscles, and move your hips and your trunk so your left / right arm swings freely. Your arm should swing because of the motion of your body, not because you are using your arm or shoulder muscles. Keep moving your hips and trunk so your arm swings in the following directions, as told by your health care provider: Side to side. Forward and backward. In clockwise and counterclockwise circles. Continue each motion for __________ seconds, or for as long as told by your health care provider. Slowly return to the starting position. Repeat __________ times. Complete this exercise __________ times a day. Shoulder flexion, standing  Stand and hold a broomstick, a cane, or a similar object. Place your hands a little more  than shoulder width apart on the object. Your left / right hand should be palm up, and your other hand should be palm down. Keep your elbow straight and your shoulder muscles relaxed. Push the stick up with your healthy arm to raise your left / right arm in front of your body, and then over your head until you feel a stretch in your shoulder (flexion). Avoid shrugging your shoulder while you raise your arm. Keep your shoulder blade tucked down toward the  middle of your back. Hold for __________ seconds. Slowly return to the starting position. Repeat __________ times. Complete this exercise __________ times a day. Shoulder abduction, standing Stand and hold a broomstick, a cane, or a similar object. Place your hands a little more than shoulder width apart on the object. Your left / right hand should be palm up, and your other hand should be palm down. Keep your elbow straight and your shoulder muscles relaxed. Push the object across your body toward your left / right side. Raise your left / right arm to the side of your body (abduction) until you feel a stretch in your shoulder. Do not raise your arm above shoulder height unless your health care provider tells you to do that. If directed, raise your arm over your head. Avoid shrugging your shoulder while you raise your arm. Keep your shoulder blade tucked down toward the middle of your back. Hold for __________ seconds. Slowly return to the starting position. Repeat __________ times. Complete this exercise __________ times a day. Internal rotation  Place your left / right hand behind your back, palm up. Use your other hand to dangle an exercise band, a towel, or a similar object over your shoulder. Grasp the band with your left / right hand so you are holding on to both ends. Gently pull up on the band until you feel a stretch in the front of your left / right shoulder. The movement of your arm toward the center of your body is called internal  rotation. Avoid shrugging your shoulder while you raise your arm. Keep your shoulder blade tucked down toward the middle of your back. Hold for __________ seconds. Release the stretch by letting go of the band and lowering your hands. Repeat __________ times. Complete this exercise __________ times a day. Strengthening exercises External rotation  Sit in a stable chair without armrests. Secure an exercise band to a stable object at elbow height on your left / right side. Place a soft object, such as a folded towel or a small pillow, between your left / right upper arm and your body to move your elbow about 4 inches (10 cm) away from your side. Hold the end of the exercise band so it is tight and there is no slack. Keeping your elbow pressed against the soft object, slowly move your forearm out, away from your abdomen (external rotation). Keep your body steady so only your forearm moves. Hold for __________ seconds. Slowly return to the starting position. Repeat __________ times. Complete this exercise __________ times a day. Shoulder abduction  Sit in a stable chair without armrests, or stand up. Hold a __________ weight in your left / right hand, or hold an exercise band with both hands. Start with your arms straight down and your left / right palm facing in, toward your body. Slowly lift your left / right hand out to your side (abduction). Do not lift your hand above shoulder height unless your health care provider tells you that this is safe. Keep your arms straight. Avoid shrugging your shoulder while you do this movement. Keep your shoulder blade tucked down toward the middle of your back. Hold for __________ seconds. Slowly lower your arm, and return to the starting position. Repeat __________ times. Complete this exercise __________ times a day. Shoulder extension Sit in a stable chair without armrests, or stand up. Secure an exercise band to a stable object in front of you so it  is at shoulder height. Hold one  end of the exercise band in each hand. Your palms should face each other. Straighten your elbows and lift your hands up to shoulder height. Step back, away from the secured end of the exercise band, until the band is tight and there is no slack. Squeeze your shoulder blades together as you pull your hands down to the sides of your thighs (extension). Stop when your hands are straight down by your sides. Do not let your hands go behind your body. Hold for __________ seconds. Slowly return to the starting position. Repeat __________ times. Complete this exercise __________ times a day. Shoulder row Sit in a stable chair without armrests, or stand up. Secure an exercise band to a stable object in front of you so it is at waist height. Hold one end of the exercise band in each hand. Position your palms so that your thumbs are facing the ceiling (neutral position). Bend each of your elbows to a 90-degree angle (right angle) and keep your upper arms at your sides. Step back until the band is tight and there is no slack. Slowly pull your elbows back behind you. Hold for __________ seconds. Slowly return to the starting position. Repeat __________ times. Complete this exercise __________ times a day. Shoulder press-ups  Sit in a stable chair that has armrests. Sit upright, with your feet flat on the floor. Put your hands on the armrests so your elbows are bent and your fingers are pointing forward. Your hands should be about even with the sides of your body. Push down on the armrests and use your arms to lift yourself off the chair. Straighten your elbows and lift yourself up as much as you comfortably can. Move your shoulder blades down, and avoid letting your shoulders move up toward your ears. Keep your feet on the ground. As you get stronger, your feet should support less of your body weight as you lift yourself up. Hold for __________ seconds. Slowly lower  yourself back into the chair. Repeat __________ times. Complete this exercise __________ times a day. Wall push-ups  Stand so you are facing a stable wall. Your feet should be about one arm-length away from the wall. Lean forward and place your palms on the wall at shoulder height. Keep your feet flat on the floor as you bend your elbows and lean forward toward the wall. Hold for __________ seconds. Straighten your elbows to push yourself back to the starting position. Repeat __________ times. Complete this exercise __________ times a day. This information is not intended to replace advice given to you by your health care provider. Make sure you discuss any questions you have with your healthcare provider. Document Revised: 11/21/2018 Document Reviewed: 08/29/2018 Elsevier Patient Education  2022 [employer].

## 2021-04-08 LAB — CBC WITH DIFFERENTIAL/PLATELET
Absolute Monocytes: 768 cells/uL (ref 200–950)
Basophils Absolute: 64 cells/uL (ref 0–200)
Basophils Relative: 0.8 %
Eosinophils Absolute: 416 cells/uL (ref 15–500)
Eosinophils Relative: 5.2 %
HCT: 51 % — ABNORMAL HIGH (ref 38.5–50.0)
Hemoglobin: 17.6 g/dL — ABNORMAL HIGH (ref 13.2–17.1)
Lymphs Abs: 1976 cells/uL (ref 850–3900)
MCH: 32.4 pg (ref 27.0–33.0)
MCHC: 34.5 g/dL (ref 32.0–36.0)
MCV: 93.9 fL (ref 80.0–100.0)
MPV: 10.9 fL (ref 7.5–12.5)
Monocytes Relative: 9.6 %
Neutro Abs: 4776 cells/uL (ref 1500–7800)
Neutrophils Relative %: 59.7 %
Platelets: 264 10*3/uL (ref 140–400)
RBC: 5.43 10*6/uL (ref 4.20–5.80)
RDW: 12.6 % (ref 11.0–15.0)
Total Lymphocyte: 24.7 %
WBC: 8 10*3/uL (ref 3.8–10.8)

## 2021-04-08 LAB — COMPLETE METABOLIC PANEL WITH GFR
AG Ratio: 2 (calc) (ref 1.0–2.5)
ALT: 26 U/L (ref 9–46)
AST: 22 U/L (ref 10–40)
Albumin: 4.5 g/dL (ref 3.6–5.1)
Alkaline phosphatase (APISO): 86 U/L (ref 36–130)
BUN: 17 mg/dL (ref 7–25)
CO2: 29 mmol/L (ref 20–32)
Calcium: 9.5 mg/dL (ref 8.6–10.3)
Chloride: 106 mmol/L (ref 98–110)
Creat: 0.99 mg/dL (ref 0.60–1.29)
Globulin: 2.2 g/dL (calc) (ref 1.9–3.7)
Glucose, Bld: 100 mg/dL — ABNORMAL HIGH (ref 65–99)
Potassium: 4.2 mmol/L (ref 3.5–5.3)
Sodium: 140 mmol/L (ref 135–146)
Total Bilirubin: 0.6 mg/dL (ref 0.2–1.2)
Total Protein: 6.7 g/dL (ref 6.1–8.1)
eGFR: 96 mL/min/{1.73_m2} (ref >=60)

## 2021-04-08 LAB — QUANTIFERON-TB GOLD PLUS
Mitogen-NIL: >10 IU/mL
NIL: 0.02 IU/mL
QuantiFERON-TB Gold Plus: NEGATIVE
TB1-NIL: 0 IU/mL
TB2-NIL: 0 IU/mL

## 2021-04-09 NOTE — Progress Notes (Signed)
TB Gold is negative.

## 2021-05-23 ENCOUNTER — Other Ambulatory Visit: Payer: Self-pay

## 2021-05-23 MED ORDER — HUMIRA (2 PEN) 40 MG/0.4ML ~~LOC~~ AJKT: AUTO-INJECTOR | SUBCUTANEOUS | 0 refills | Status: DC

## 2021-05-23 NOTE — Telephone Encounter (Signed)
Next Visit: 07/10/2021  Last Visit: 04/05/2021  Last Fill: 12/06/2020  [id number]  Current Dose per office note 04/05/2021: Humira 40 mg sq injections every 14 days  Labs: 04/05/2021, Hgb and hct are borderline elevated. Rest of CBC WNL. CMP WNL.   TB Gold: 04/05/2021, negative   Okay to refill Humira?

## 2021-05-23 NOTE — Telephone Encounter (Signed)
Patient called requesting prescription refill of Humira to be sent to [facility].

## 2021-06-26 NOTE — Progress Notes (Signed)
Office Visit Note  Patient: Dennis Todd             Date of Birth: 02-07-1976           MRN: [id number]             PCP: Medicine, [facility] Referring: Medicine, [place]* Visit Date: 07/10/2021 Occupation: _0 @  Subjective:  Bilateral SI joint pain   History of Present Illness: Dennis Todd is a 45 y.o. male with history of spondyloarthritis and DDD. He is on humira 40 mg sq injections every 14 days.  He has not missed any doses of Humira recently.  He presents today with bilateral SI joint pain and lumbar spine discomfort.  He states that he is scheduled for an ablation on 07/19/2021 but is requesting bilateral SI joint cortisone injections which have alleviated his discomfort in the past.  His morning stiffness continues to last 30 to 45 minutes daily.  If he is sedentary for longer than 20 minutes he experiences generalized joint stiffness.  He states last week he was experiencing pain in his left hand especially in the middle finger.  He states at that time he is also noticing some decreased grip strength.  He denies any joint swelling or numbness or tingling.  He is having some issues with right lateral epicondylitis.  In the past he tried a brace which was not helpful.  He is also tried dry needling which alleviated his symptoms in the past so he is considering trying it again.  He denies any Achilles tendinitis or plantar fasciitis. He denies any recent infections.     Activities of Daily Living:  Patient reports morning stiffness for 30-45 minutes.   Patient Denies nocturnal pain.  Difficulty dressing/grooming: Denies Difficulty climbing stairs: Denies Difficulty getting out of chair: Denies Difficulty using hands for taps, buttons, cutlery, and/or writing: Reports  Review of Systems  Constitutional:  Negative for fatigue and night sweats.  HENT:  Positive for mouth dryness. Negative for mouth sores and nose dryness.   Eyes:  Negative for  pain, redness, itching and dryness.  Respiratory:  Negative for shortness of breath and difficulty breathing.   Cardiovascular:  Negative for chest pain, palpitations, hypertension, irregular heartbeat and swelling in legs/feet.  Gastrointestinal:  Negative for blood in stool, constipation and diarrhea.  Endocrine: Negative for increased urination.  Genitourinary:  Negative for difficulty urinating and painful urination.  Musculoskeletal:  Positive for joint pain, joint pain, myalgias, morning stiffness, muscle tenderness and myalgias. Negative for joint swelling and muscle weakness.  Skin:  Negative for color change, rash, hair loss, nodules/bumps, redness, skin tightness, ulcers and sensitivity to sunlight.  Allergic/Immunologic: Negative for susceptible to infections.  Neurological:  Negative for dizziness, fainting, numbness, headaches, memory loss, night sweats and weakness.  Hematological:  Negative for bruising/bleeding tendency and swollen glands.  Psychiatric/Behavioral:  Negative for depressed mood, confusion and sleep disturbance. The patient is not nervous/anxious.    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   HLA B27 (HLA B27 positive) 10/12/2016    Past Medical History:  Diagnosis Date   Arthritis    Migraine     Family History  Problem Relation Age of Onset   Diabetes Father    Rheum arthritis Brother  Healthy Daughter    Past Surgical History:  Procedure Laterality Date   ANKLE SURGERY Left    back ablation     back injections     NASAL SINUS SURGERY     Social History   Social History Narrative   Not on file   Immunization History  Administered Date(s) Administered   Influenza,inj,Quad PF,6+ Mos 07/21/2015   Influenza-Unspecified 09/27/2016      Objective: Vital Signs: BP 129/90 (BP Location: Left Arm, Patient Position: Sitting, Cuff Size: Normal)   Pulse 70   Ht 5' 9" (1.753 m)   Wt 205 lb 6.4 oz (93.2 kg)   BMI 30.33 kg/m    Physical Exam Vitals and nursing note reviewed.  Constitutional:      Appearance: He is well-developed.  HENT:     Head: Normocephalic and atraumatic.  Eyes:     Conjunctiva/sclera: Conjunctivae normal.     Pupils: Pupils are equal, round, and reactive to light.  Cardiovascular:     Rate and Rhythm: Normal rate and regular rhythm.     Heart sounds: Normal heart sounds.  Pulmonary:     Effort: Pulmonary effort is normal.     Breath sounds: Normal breath sounds.  Abdominal:     General: Bowel sounds are normal.     Palpations: Abdomen is soft.  Musculoskeletal:     Cervical back: Normal range of motion and neck supple.  Skin:    General: Skin is warm and dry.     Capillary Refill: Capillary refill takes less than 2 seconds.  Neurological:     Mental Status: He is alert and oriented to person, place, and time.  Psychiatric:        Behavior: Behavior normal.     Musculoskeletal Exam: C-spine has slightly limited ROM with lateral rotation.  Midline spinal tenderness in the lumbar region.  Tenderness over both SI joints.  Shoulder joints, elbow joints, wrist joints, MCPs, PIPs, and DIPs good ROM with no synovitis. Tenderness over the lateral epicondyle of the right elbow. PIP and DIP thickening consistent with OA of both hands. Complete fist formation bilaterally. Hip joints, knee joints, and ankle joints have good ROM.  No warmth or effusion of knee joints.  No tenderness or swelling of ankle joints.  No evidence of achilles tendonitis.   CDAI Exam: CDAI Score: -- Patient Global: --; Provider Global: -- Swollen: --; Tender: -- Joint Exam 07/10/2021   No joint exam has been documented for this visit   There is currently no information documented on the homunculus. Go to the Rheumatology  activity and complete the homunculus joint exam.  Investigation: No additional findings.  Imaging: No results found.  Recent Labs: Lab Results  Component Value Date   WBC 8.0 04/05/2021   HGB 17.6 (H) 04/05/2021   PLT 264 04/05/2021   NA 140 04/05/2021   K 4.2 04/05/2021   CL 106 04/05/2021   CO2 29 04/05/2021   GLUCOSE 100 (H) 04/05/2021   BUN 17 04/05/2021   CREATININE 0.99 04/05/2021   BILITOT 0.6 04/05/2021   ALKPHOS 96 04/02/2017   AST 22 04/05/2021   ALT 26 04/05/2021   PROT 6.7 04/05/2021   ALBUMIN 4.5 04/02/2017   CALCIUM 9.5 04/05/2021   GFRAA 98 09/09/2020   QFTBGOLDPLUS NEGATIVE 04/05/2021    Speciality Comments:   FU every 3 months  Procedures:  Sacroiliac Joint Inj on 07/10/2021 8:26 AM Indications: pain Details: 27 G 1.5 in needle, posterior approach Medications (Right):   1 mL lidocaine 1 %; 40 mg triamcinolone acetonide 40 MG/ML Aspirate (Right): 0 mL Medications (Left): 1 mL lidocaine 1 %; 40 mg triamcinolone acetonide 40 MG/ML Aspirate (Left): 0 mL Outcome: tolerated well, no immediate complications Procedure, treatment alternatives, risks and benefits explained, specific risks discussed. Consent was given by the patient. Immediately prior to procedure a time out was called to verify the correct patient, procedure, equipment, support staff and site/side marked as required. Patient was prepped and draped in the usual sterile fashion.    Allergies: Patient has no known allergies.   Assessment / Plan:     Visit Diagnoses: Spondyloarthritis: He has no synovitis or dactylitis on examination. His morning stiffness has been lasting 30-45 minutes daily.   He presents today with bilateral SI joint tenderness and stiffness.  He has not been experiencing any nocturnal pain.  He had bilateral SI joint cortisone injection performed on 04/05/2021 which alleviated his symptoms.  He requested repeat cortisone injections today.  Overall his symptoms have been stable  on the current treatment regimen of Humira 40 mg sq injections every 14 days.  He has no evidence of Achilles tendonitis or plantar fasciitis.  He has not had any eye pain, photophobia, or conjunctival injection.  He will remain on Humira as prescribed.  He was advised to notify us if he develops signs or symptoms of a flare.  He will follow-up in the office in 3 months.   HLA B27 (HLA B27 positive)  High risk medication use - Humira 40 mg sq injections every 14 days.  CBC and CMP updated on 04/05/21.  He is due to update lab work today.  Orders for CBC and CMP released.  His next lab work will be due in February and every 3 months.  Standing orders for CBC and CMP are in place. TB gold negative on 04/05/21.  - Plan: CBC with Differential/Platelet, COMPLETE METABOLIC PANEL WITH GFR He has not had any recent infections.  Discussed the importance of holding Humira if he develops signs or symptoms of an infection and to resume once the infection is completely cleared.   Chronic right shoulder pain: He has good ROM of the right shoulder joint with no discomfort. No tenderness noted.  Chronic SI joint pain -He presents today with bilateral SI joint discomfort.  He has tenderness over both SI joints on exam.  Bilateral SI joints were injected with cortisone today.  He tolerated the procedure well.  Procedure notes completed above. Aftercare was discussed. Plan: Sacroiliac Joint Inj  DDD (degenerative disc disease), lumbar - He has been followed by back specialist.  He had radiofrequency ablation x2.  He is scheduled for a repeat ablation on 07/19/21.    Other medical conditions are listed as follows:   Personal history of chronic sinusitis  History of migraine  Vitamin D deficiency  Tinea versicolor  Orders: Orders Placed This Encounter  Procedures   Sacroiliac Joint Inj   CBC with Differential/Platelet   COMPLETE METABOLIC PANEL WITH GFR   No orders of the defined types were placed in this  encounter.    Follow-Up Instructions: Return in about 3 months (around 10/10/2021) for Spondyloarthritis, DDD.    M , PA-C  Note - This record has been created using Dragon software.  Chart creation errors have been sought, but may not always  have been located. Such creation errors do not reflect on  the standard of medical care.  

## 2021-07-10 ENCOUNTER — Other Ambulatory Visit: Payer: Self-pay

## 2021-07-10 ENCOUNTER — Encounter: Payer: Self-pay | Admitting: Physician Assistant

## 2021-07-10 ENCOUNTER — Ambulatory Visit (INDEPENDENT_AMBULATORY_CARE_PROVIDER_SITE_OTHER): Payer: 59 | Admitting: Physician Assistant

## 2021-07-10 VITALS — BP 129/90 | HR 70 | Ht 69.0 in | Wt 205.4 lb

## 2021-07-10 DIAGNOSIS — M533 Sacrococcygeal disorders, not elsewhere classified: Secondary | ICD-10-CM | POA: Diagnosis not present

## 2021-07-10 DIAGNOSIS — B360 Pityriasis versicolor: Secondary | ICD-10-CM

## 2021-07-10 DIAGNOSIS — M25511 Pain in right shoulder: Secondary | ICD-10-CM | POA: Diagnosis not present

## 2021-07-10 DIAGNOSIS — E559 Vitamin D deficiency, unspecified: Secondary | ICD-10-CM

## 2021-07-10 DIAGNOSIS — Z8669 Personal history of other diseases of the nervous system and sense organs: Secondary | ICD-10-CM

## 2021-07-10 DIAGNOSIS — M47819 Spondylosis without myelopathy or radiculopathy, site unspecified: Secondary | ICD-10-CM

## 2021-07-10 DIAGNOSIS — Z1589 Genetic susceptibility to other disease: Secondary | ICD-10-CM | POA: Diagnosis not present

## 2021-07-10 DIAGNOSIS — Z8709 Personal history of other diseases of the respiratory system: Secondary | ICD-10-CM

## 2021-07-10 DIAGNOSIS — G8929 Other chronic pain: Secondary | ICD-10-CM | POA: Diagnosis not present

## 2021-07-10 DIAGNOSIS — Z79899 Other long term (current) drug therapy: Secondary | ICD-10-CM

## 2021-07-10 DIAGNOSIS — M5136 Other intervertebral disc degeneration, lumbar region: Secondary | ICD-10-CM

## 2021-07-10 LAB — CBC WITH DIFFERENTIAL/PLATELET
Absolute Monocytes: 796 cells/uL (ref 200–950)
Basophils Absolute: 55 cells/uL (ref 0–200)
Basophils Relative: 0.5 %
Eosinophils Absolute: 469 cells/uL (ref 15–500)
Eosinophils Relative: 4.3 %
HCT: 50.6 % — ABNORMAL HIGH (ref 38.5–50.0)
Hemoglobin: 17.7 g/dL — ABNORMAL HIGH (ref 13.2–17.1)
Lymphs Abs: 2453 cells/uL (ref 850–3900)
MCH: 33.3 pg — ABNORMAL HIGH (ref 27.0–33.0)
MCHC: 35 g/dL (ref 32.0–36.0)
MCV: 95.1 fL (ref 80.0–100.0)
MPV: 11.1 fL (ref 7.5–12.5)
Monocytes Relative: 7.3 %
Neutro Abs: 7129 cells/uL (ref 1500–7800)
Neutrophils Relative %: 65.4 %
Platelets: 249 10*3/uL (ref 140–400)
RBC: 5.32 10*6/uL (ref 4.20–5.80)
RDW: 13.1 % (ref 11.0–15.0)
Total Lymphocyte: 22.5 %
WBC: 10.9 10*3/uL — ABNORMAL HIGH (ref 3.8–10.8)

## 2021-07-10 LAB — COMPLETE METABOLIC PANEL WITH GFR
AG Ratio: 1.7 (calc) (ref 1.0–2.5)
ALT: 25 U/L (ref 9–46)
AST: 19 U/L (ref 10–40)
Albumin: 4.2 g/dL (ref 3.6–5.1)
Alkaline phosphatase (APISO): 97 U/L (ref 36–130)
BUN: 15 mg/dL (ref 7–25)
CO2: 31 mmol/L (ref 20–32)
Calcium: 9.5 mg/dL (ref 8.6–10.3)
Chloride: 103 mmol/L (ref 98–110)
Creat: 1.03 mg/dL (ref 0.60–1.29)
Globulin: 2.5 g/dL (calc) (ref 1.9–3.7)
Glucose, Bld: 101 mg/dL — ABNORMAL HIGH (ref 65–99)
Potassium: 4.1 mmol/L (ref 3.5–5.3)
Sodium: 142 mmol/L (ref 135–146)
Total Bilirubin: 0.6 mg/dL (ref 0.2–1.2)
Total Protein: 6.7 g/dL (ref 6.1–8.1)
eGFR: 92 mL/min/{1.73_m2} (ref >=60)

## 2021-07-10 MED ORDER — LIDOCAINE HCL 1 % IJ SOLN
1.0000 mL | INTRAMUSCULAR | Status: AC | PRN
Start: 2021-07-10 — End: 2021-07-10
  Administered 2021-07-10: 1 mL

## 2021-07-10 MED ORDER — TRIAMCINOLONE ACETONIDE 40 MG/ML IJ SUSP
40.0000 mg | INTRAMUSCULAR | Status: AC | PRN
  Administered 2021-07-10: 40 mg via INTRA_ARTICULAR

## 2021-07-10 MED ORDER — LIDOCAINE HCL 1 % IJ SOLN
1.0000 mL | INTRAMUSCULAR | Status: AC | PRN
  Administered 2021-07-10: 1 mL

## 2021-07-10 NOTE — Patient Instructions (Signed)
Standing Labs We placed an order today for your standing lab work.   Please have your standing labs drawn in February and every 3 months   If possible, please have your labs drawn 2 weeks prior to your appointment so that the provider can discuss your results at your appointment.  Please note that you may see your imaging and lab results in MyChart before we have reviewed them. We may be awaiting multiple results to interpret others before contacting you. Please allow our office up to 72 hours to thoroughly review all of the results before contacting the office for clarification of your results.  We have open lab daily: Monday through Thursday from 1:30-4:30 PM and Friday from 1:30-4:00 PM at the office of Dr. Shaili Deveshwar, [facility].   Please be advised, all patients with office appointments requiring lab work will take precedent over walk-in lab work.  If possible, please come for your lab work on Monday and Friday afternoons, as you may experience shorter wait times. The office is located at [address], [address], [place], [place] [address] No appointment is necessary.   Labs are drawn by [facility]. Please bring your co-pay at the time of your lab draw.  You may receive a bill from [facility] for your lab work.  If you wish to have your labs drawn at another location, please call the office 24 hours in advance to send orders.  If you have any questions regarding directions or hours of operation,  please call [phone].   As a reminder, please drink plenty of water prior to coming for your lab work. Thanks!  

## 2021-07-11 NOTE — Progress Notes (Signed)
CMP WNL. WBC count is borderline elevated and hemoglobin and hematocrit are borderline elevated but stable.

## 2021-07-19 DIAGNOSIS — M47816 Spondylosis without myelopathy or radiculopathy, lumbar region: Secondary | ICD-10-CM | POA: Diagnosis not present

## 2021-07-31 ENCOUNTER — Other Ambulatory Visit: Payer: Self-pay | Admitting: Physician Assistant

## 2021-07-31 NOTE — Telephone Encounter (Signed)
Next Visit: 10/11/2021  Last Visit: 07/10/2021  Last Fill: 05/23/2021  [id number]  Current Dose per office note 07/10/2021: Humira 40 mg sq injections every 14 days  Labs: 07/10/2021 CMP WNL. WBC count is borderline elevated and hemoglobin and hematocrit are borderline elevated but stable.    TB Gold: 04/05/2021 Neg    Okay to refill Humira?

## 2021-08-16 ENCOUNTER — Telehealth: Payer: Self-pay | Admitting: Rheumatology

## 2021-08-16 NOTE — Telephone Encounter (Signed)
error 

## 2021-08-22 ENCOUNTER — Other Ambulatory Visit: Payer: Self-pay | Admitting: Rheumatology

## 2021-08-22 MED ORDER — HUMIRA (2 PEN) 40 MG/0.4ML ~~LOC~~ AJKT: 40.0000 mg | AUTO-INJECTOR | SUBCUTANEOUS | 2 refills | Status: DC

## 2021-08-22 NOTE — Telephone Encounter (Signed)
Next Visit: 10/11/2021   Last Visit: 07/10/2021   [id number]   Current Dose per office note 07/10/2021: Humira 40 mg sq injections every 14 days   Labs: 07/10/2021 CMP WNL. WBC count is borderline elevated and hemoglobin and hematocrit are borderline elevated but stable.     TB Gold: 04/05/2021 Neg     Okay to refill Humira?

## 2021-08-22 NOTE — Telephone Encounter (Signed)
Patient called the office requesting a refill of Humira 40mg /0.4ml to be sent to [facility]. Pharmacy Phone #[phone]

## 2021-08-30 ENCOUNTER — Telehealth: Payer: Self-pay | Admitting: Pharmacist

## 2021-08-30 NOTE — Telephone Encounter (Signed)
Submitted an URGENT Prior Authorization RENEWAL request to [facility] for HUMIRA via CoverMyMeds. Will update once we receive a response.  Key: [id number]  Chesley Mires, PharmD, MPH, BCPS Clinical Pharmacist (Rheumatology and Pulmonology)

## 2021-08-31 DIAGNOSIS — M5459 Other low back pain: Secondary | ICD-10-CM | POA: Diagnosis not present

## 2021-08-31 DIAGNOSIS — G8929 Other chronic pain: Secondary | ICD-10-CM | POA: Diagnosis not present

## 2021-08-31 DIAGNOSIS — M47816 Spondylosis without myelopathy or radiculopathy, lumbar region: Secondary | ICD-10-CM | POA: Diagnosis not present

## 2021-08-31 DIAGNOSIS — M7918 Myalgia, other site: Secondary | ICD-10-CM | POA: Diagnosis not present

## 2021-09-07 ENCOUNTER — Other Ambulatory Visit (HOSPITAL_COMMUNITY): Payer: Self-pay

## 2021-09-07 NOTE — Telephone Encounter (Signed)
Received notification from [facility] regarding a prior authorization for HUMIRA. Authorization has been APPROVED from 08/30/21 to 08/29/22.   Unable to run test claim. Patient can continue to fill through [facility]: [phone]  Chesley Mires, PharmD, MPH, BCPS Clinical Pharmacist (Rheumatology and Pulmonology)

## 2021-09-28 NOTE — Progress Notes (Signed)
Office Visit Note  Patient: Dennis Todd             Date of Birth: 02-Apr-1976           MRN: [id number]             PCP: Medicine, [facility] Referring: Medicine, [place]* Visit Date: 10/11/2021 Occupation: '@GUAROCC' @  Subjective:  Medication monitoring   History of Present Illness: Dennis Todd is a 46 y.o. male with history of spondyloarthritis and DDD.  Patient is on Humira 40 mg sq injections every 14 days.  He has not missed any doses of Humira recently and continues to tolerate it without any side effects or injection site reactions.  He denies any signs or symptoms of a flare since his last office visit.  He reports that Dr. Francesco Runner performed a spinal ablation prior to Christmas in which time that he was off for about 12 days.  His lower back pain has been significantly more tolerable.  He has occasional SI joint discomfort but is not having any symptoms of sciatica at this time.  He denies any other joint pain or joint swelling at this time.  He denies any Achilles tendonitis or plantar fasciitis.  He has not had any eye pain or inflammation.  He denies any recent rashes.  He denies any recent infections.  Activities of Daily Living:  Patient reports morning stiffness for 1 hour.   Patient Denies nocturnal pain.  Difficulty dressing/grooming: Denies Difficulty climbing stairs: Denies Difficulty getting out of chair: Denies Difficulty using hands for taps, buttons, cutlery, and/or writing: Denies  Review of Systems  Constitutional:  Negative for fatigue.  HENT:  Positive for mouth dryness. Negative for mouth sores and nose dryness.   Eyes:  Negative for pain, itching and dryness.  Respiratory:  Negative for shortness of breath and difficulty breathing.   Cardiovascular:  Negative for chest pain and palpitations.  Gastrointestinal:  Negative for blood in stool, constipation and diarrhea.  Endocrine: Negative for increased urination.   Genitourinary:  Negative for difficulty urinating.  Musculoskeletal:  Positive for morning stiffness. Negative for joint pain, joint pain, joint swelling, myalgias, muscle tenderness and myalgias.  Skin:  Negative for color change, rash and redness.  Allergic/Immunologic: Negative for susceptible to infections.  Neurological:  Positive for headaches. Negative for dizziness, numbness, memory loss and weakness.  Hematological:  Negative for bruising/bleeding tendency.  Psychiatric/Behavioral:  Negative for confusion.    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   HLA B27 (HLA B27 positive) 10/12/2016    Past Medical History:  Diagnosis Date   Arthritis    Migraine     Family History  Problem Relation Age of Onset   Diabetes Father    Rheum arthritis Brother    Healthy Daughter    Past Surgical History:  Procedure Laterality Date   ANKLE SURGERY Left    back ablation     back injections     NASAL SINUS SURGERY     Social History   Social History Narrative   Not on file   Immunization History  Administered Date(s) Administered   Influenza,inj,Quad PF,6+ Mos 07/21/2015   Influenza-Unspecified 09/27/2016     Objective: Vital Signs: BP 131/87 (BP Location: Left Arm, Patient Position:  Sitting, Cuff Size: Normal)    Pulse 60    Ht '5\' 9"'  (1.753 m)    Wt 207 lb (93.9 kg)    BMI 30.57 kg/m    Physical Exam Vitals and nursing note reviewed.  Constitutional:      Appearance: He is well-developed.  HENT:     Head: Normocephalic and atraumatic.  Eyes:     Conjunctiva/sclera: Conjunctivae normal.     Pupils: Pupils are equal, round, and reactive to light.  Cardiovascular:     Rate and Rhythm: Normal rate and regular rhythm.     Heart sounds: Normal heart sounds.   Pulmonary:     Effort: Pulmonary effort is normal.     Breath sounds: Normal breath sounds.  Abdominal:     General: Bowel sounds are normal.     Palpations: Abdomen is soft.  Musculoskeletal:     Cervical back: Normal range of motion and neck supple.  Skin:    General: Skin is warm and dry.     Capillary Refill: Capillary refill takes less than 2 seconds.  Neurological:     Mental Status: He is alert and oriented to person, place, and time.  Psychiatric:        Behavior: Behavior normal.     Musculoskeletal Exam: C-spine, thoracic spine, and lumbar spine have good ROM.  No midline spinal tenderness.  Mild tenderness over both SI joints.  Shoulder joints, elbow joints, wrist joints, MCPs, PIPs, DIPs have good range of motion with no synovitis.  He has PIP and DIP joints.  Hip joints have good range of motion with no groin pain.  Knee joints have good ROM with no warmth or effusion.  Ankle joints have good ROM with no tenderness or joint swelling.  No evidence of achilles tendonitis.   CDAI Exam: CDAI Score: -- Patient Global: --; Provider Global: -- Swollen: --; Tender: -- Joint Exam 10/11/2021   No joint exam has been documented for this visit   There is currently no information documented on the homunculus. Go to the Rheumatology activity and complete the homunculus joint exam.  Investigation: No additional findings.  Imaging: No results found.  Recent Labs: Lab Results  Component Value Date   WBC 10.9 (H) 07/10/2021   HGB 17.7 (H) 07/10/2021   PLT 249 07/10/2021   NA 142 07/10/2021   K 4.1 07/10/2021   CL 103 07/10/2021   CO2 31 07/10/2021   GLUCOSE 101 (H) 07/10/2021   BUN 15 07/10/2021   CREATININE 1.03 07/10/2021   BILITOT 0.6 07/10/2021   ALKPHOS 96 04/02/2017   AST 19 07/10/2021   ALT 25 07/10/2021   PROT 6.7 07/10/2021   ALBUMIN 4.5 04/02/2017   CALCIUM 9.5 07/10/2021   GFRAA 98 09/09/2020   QFTBGOLDPLUS NEGATIVE 04/05/2021    Speciality Comments:    FU every 3 months  Procedures:  No procedures performed Allergies: Patient has no known allergies.   Assessment / Plan:     Visit Diagnoses: Spondyloarthritis: He has not had any signs or symptoms of a flare since his last office visit.  He has clinically been doing well on Humira 40 mg sq injections every 14 days.  He has no synovitis or dactylitis on examination.  No evidence of Achilles tendinitis or plantar fasciitis.  He has mild SI joint tenderness bilaterally.  No midline spinal tenderness.  He had radiofrequency ablation performed by Dr. Francesco Runner in December 2022 with provided significant pain relief. He has not had any  urinary pain or inflammation.  No recent rashes.  He has no new concerns at this time. He will remain on Humira as monotherapy.  He was advised to notify us if he develops increased joint pain or joint swelling.  He will follow-up in the office in 3 months.  HLA B27 (HLA B27 positive)  High risk medication use - Humira 40 mg sq injections every 14 days.  - Plan: CBC with Differential/Platelet, COMPLETE METABOLIC PANEL WITH GFR CBC and CMP updated on 07/10/2021.  He is due to update lab work today.  Orders for CBC and CMP were released.  His next lab work will be due in June and every 3 months to monitor for drug toxicity.  TB Gold negative on 04/05/2021 and will continue to be monitored yearly. He has not had any recent infections. Discussed the importance of holding Humira if he develops signs or symptoms of infection and to resume once the infection has completely cleared. Discussed the importance of yearly skin examinations while on Humira due to the increased risk for nonmelanoma skin cancers.  Chronic right shoulder pain: Resolved.  He has good ROM with no discomfort on examination today.   Chronic SI joint pain: He has mild SI joint tenderness bilaterally.  His lower back discomfort has improved significantly.  He will remain on Humira as prescribed.  DDD  (degenerative disc disease), lumbar - He has been followed by back specialist: Dr. Francesco Runner.  He had radiofrequency ablation x3.  His most recent ablation was on 07/19/21.  He has noticed significant improvement in his lower back discomfort.  He has no symptoms of radiculopathy at this time.  No midline spinal tenderness was noted.  Other medical conditions are listed as follows:   Personal history of chronic sinusitis  History of migraine  Vitamin D deficiency  Tinea versicolor  Orders: Orders Placed This Encounter  Procedures   CBC with Differential/Platelet   COMPLETE METABOLIC PANEL WITH GFR   No orders of the defined types were placed in this encounter.    Follow-Up Instructions: Return in about 3 months (around 01/11/2022) for Spondyloarthropathy, DDD.   Ofilia Neas, PA-C  Note - This record has been created using Dragon software.  Chart creation errors have been sought, but may not always  have been located. Such creation errors do not reflect on  the standard of medical care.

## 2021-10-11 ENCOUNTER — Other Ambulatory Visit: Payer: Self-pay

## 2021-10-11 ENCOUNTER — Ambulatory Visit (INDEPENDENT_AMBULATORY_CARE_PROVIDER_SITE_OTHER): Payer: BC Managed Care – PPO | Admitting: Physician Assistant

## 2021-10-11 ENCOUNTER — Encounter: Payer: Self-pay | Admitting: Physician Assistant

## 2021-10-11 VITALS — BP 131/87 | HR 60 | Ht 69.0 in | Wt 207.0 lb

## 2021-10-11 DIAGNOSIS — Z79899 Other long term (current) drug therapy: Secondary | ICD-10-CM

## 2021-10-11 DIAGNOSIS — M25511 Pain in right shoulder: Secondary | ICD-10-CM

## 2021-10-11 DIAGNOSIS — E559 Vitamin D deficiency, unspecified: Secondary | ICD-10-CM

## 2021-10-11 DIAGNOSIS — M47819 Spondylosis without myelopathy or radiculopathy, site unspecified: Secondary | ICD-10-CM

## 2021-10-11 DIAGNOSIS — M533 Sacrococcygeal disorders, not elsewhere classified: Secondary | ICD-10-CM

## 2021-10-11 DIAGNOSIS — Z1589 Genetic susceptibility to other disease: Secondary | ICD-10-CM

## 2021-10-11 DIAGNOSIS — B360 Pityriasis versicolor: Secondary | ICD-10-CM

## 2021-10-11 DIAGNOSIS — G8929 Other chronic pain: Secondary | ICD-10-CM

## 2021-10-11 DIAGNOSIS — Z8669 Personal history of other diseases of the nervous system and sense organs: Secondary | ICD-10-CM

## 2021-10-11 DIAGNOSIS — Z8709 Personal history of other diseases of the respiratory system: Secondary | ICD-10-CM

## 2021-10-11 DIAGNOSIS — M5136 Other intervertebral disc degeneration, lumbar region: Secondary | ICD-10-CM

## 2021-10-12 DIAGNOSIS — Z79891 Long term (current) use of opiate analgesic: Secondary | ICD-10-CM | POA: Diagnosis not present

## 2021-10-12 DIAGNOSIS — M7918 Myalgia, other site: Secondary | ICD-10-CM | POA: Diagnosis not present

## 2021-10-12 DIAGNOSIS — M47816 Spondylosis without myelopathy or radiculopathy, lumbar region: Secondary | ICD-10-CM | POA: Diagnosis not present

## 2021-10-12 DIAGNOSIS — M5459 Other low back pain: Secondary | ICD-10-CM | POA: Diagnosis not present

## 2021-10-12 LAB — COMPLETE METABOLIC PANEL WITH GFR
AG Ratio: 2 (calc) (ref 1.0–2.5)
ALT: 20 U/L (ref 9–46)
AST: 19 U/L (ref 10–40)
Albumin: 4.5 g/dL (ref 3.6–5.1)
Alkaline phosphatase (APISO): 97 U/L (ref 36–130)
BUN: 18 mg/dL (ref 7–25)
CO2: 29 mmol/L (ref 20–32)
Calcium: 9.7 mg/dL (ref 8.6–10.3)
Chloride: 104 mmol/L (ref 98–110)
Creat: 1.18 mg/dL (ref 0.60–1.29)
Globulin: 2.2 g/dL (calc) (ref 1.9–3.7)
Glucose, Bld: 92 mg/dL (ref 65–99)
Potassium: 4.2 mmol/L (ref 3.5–5.3)
Sodium: 141 mmol/L (ref 135–146)
Total Bilirubin: 0.7 mg/dL (ref 0.2–1.2)
Total Protein: 6.7 g/dL (ref 6.1–8.1)
eGFR: 78 mL/min/{1.73_m2} (ref >=60)

## 2021-10-12 LAB — CBC WITH DIFFERENTIAL/PLATELET
Absolute Monocytes: 748 cells/uL (ref 200–950)
Basophils Absolute: 59 cells/uL (ref 0–200)
Basophils Relative: 0.7 %
Eosinophils Absolute: 386 cells/uL (ref 15–500)
Eosinophils Relative: 4.6 %
HCT: 51.2 % — ABNORMAL HIGH (ref 38.5–50.0)
Hemoglobin: 17.4 g/dL — ABNORMAL HIGH (ref 13.2–17.1)
Lymphs Abs: 2360 cells/uL (ref 850–3900)
MCH: 32.2 pg (ref 27.0–33.0)
MCHC: 34 g/dL (ref 32.0–36.0)
MCV: 94.8 fL (ref 80.0–100.0)
MPV: 11.3 fL (ref 7.5–12.5)
Monocytes Relative: 8.9 %
Neutro Abs: 4847 cells/uL (ref 1500–7800)
Neutrophils Relative %: 57.7 %
Platelets: 237 10*3/uL (ref 140–400)
RBC: 5.4 10*6/uL (ref 4.20–5.80)
RDW: 13 % (ref 11.0–15.0)
Total Lymphocyte: 28.1 %
WBC: 8.4 10*3/uL (ref 3.8–10.8)

## 2021-10-12 NOTE — Progress Notes (Signed)
CMP WNL.  Hgb and hct remain borderline elevated.  Rest of CBC WNL.  We will continue to monitor.

## 2021-12-28 NOTE — Progress Notes (Unsigned)
Office Visit Note  Patient: Dennis Todd             Date of Birth: [birth year]           MRN: [id number]             PCP: Medicine, [facility] Referring: Medicine, [place]* Visit Date: 01/11/2022 Occupation: _0 @  Subjective:  Medication monitoring   History of Present Illness: MECHEL SCHUTTER is a 46 y.o. 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  He remains on humira 40 mg sq injections once every 14 days.  He continues to tolerate Humira without any side effects and has not missed any doses recently.  Patient reports that he is currently taking a course of Augmentin and a prednisone Dosepak for sinus infection.  He states his symptoms are improving.  He is not due for his Humira dose until next Thursday.  He denies any signs or symptoms of a flare.  He has not been experiencing any nocturnal pain.  His morning stiffness continues to last for about 1 hour.  He denies any joint pain or joint swelling at this time.  His SI joint injections improved his discomfort significantly on 07/10/2021.  He denies any plantar fasciitis or Achilles tendinitis at this time.    Activities of Daily Living:  Patient reports morning stiffness for 1 hour.   Patient Denies nocturnal pain.  Difficulty dressing/grooming: Denies Difficulty climbing stairs: Denies Difficulty getting out of chair: Denies Difficulty using hands for taps, buttons, cutlery, and/or writing: Reports  Review of Systems  Constitutional:  Positive for fatigue.  HENT:  Positive for mouth dryness. Negative for mouth sores and nose dryness.   Eyes:  Negative for pain, itching and dryness.  Respiratory:  Negative for shortness of breath and difficulty breathing.   Cardiovascular:  Negative for chest pain and palpitations.  Gastrointestinal:  Negative for blood in stool, constipation and diarrhea.  Endocrine: Negative for increased urination.  Genitourinary:  Negative for difficulty urinating.   Musculoskeletal:  Positive for joint pain, joint pain, myalgias, morning stiffness and myalgias. Negative for joint swelling and muscle tenderness.  Skin:  Negative for color change, rash and redness.  Allergic/Immunologic: Negative for susceptible to infections.  Neurological:  Positive for numbness. Negative for dizziness, headaches and memory loss.  Hematological:  Negative for bruising/bleeding tendency.  Psychiatric/Behavioral:  Negative for confusion.    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   HLA B27 (HLA B27 positive) 10/12/2016    Past Medical History:  Diagnosis Date   Arthritis    Migraine     Family History  Problem Relation Age of Onset   Diabetes Father    Rheum arthritis Brother    Healthy Daughter    Past Surgical History:  Procedure Laterality Date   ANKLE SURGERY Left    back ablation     back injections     NASAL SINUS SURGERY     Social History   Social History Narrative   Not on file   Immunization History  Administered Date(s) Administered   Influenza,inj,Quad PF,6+ Mos 07/21/2015   Influenza-Unspecified 09/27/2016     Objective: Vital Signs: BP (!) 135/92 (BP Location: Left Arm, Patient Position: Sitting, Cuff Size: Normal)   Pulse (!) 59   Ht  _0  (1.753 m)   Wt 210 lb (95.3 kg)   BMI 31.01 kg/m    Physical Exam Vitals and nursing note reviewed.  Constitutional:      Appearance: He is well-developed.  HENT:     Head: Normocephalic and atraumatic.  Eyes:     Conjunctiva/sclera: Conjunctivae normal.     Pupils: Pupils are equal, round, and reactive to light.  Cardiovascular:     Rate and Rhythm: Normal rate and regular rhythm.     Heart sounds: Normal heart sounds.  Pulmonary:     Effort: Pulmonary effort is normal.      Breath sounds: Normal breath sounds.  Abdominal:     General: Bowel sounds are normal.     Palpations: Abdomen is soft.  Musculoskeletal:     Cervical back: Normal range of motion and neck supple.  Skin:    General: Skin is warm and dry.     Capillary Refill: Capillary refill takes less than 2 seconds.  Neurological:     Mental Status: He is alert and oriented to person, place, and time.  Psychiatric:        Behavior: Behavior normal.     Musculoskeletal Exam: C-spine has slightly limited ROM.  Thoracic and lumbar spine have good ROM.  No midline spinal tenderness or SI joint tenderness.  Shoulder joints, elbow joints, wrist joints, MCPs, PIPs, and DIPs good ROM with no synovitis.  Complete fist formation bilaterally.  Hip joints have good ROM with no groin pain.  Knee joints have good ROM with no warmth or effusion.  Ankle joints have good ROM with no tenderness or joint swelling.   CDAI Exam: CDAI Score: -- Patient Global: --; Provider Global: -- Swollen: --; Tender: -- Joint Exam 01/11/2022   No joint exam has been documented for this visit   There is currently no information documented on the homunculus. Go to the Rheumatology activity and complete the homunculus joint exam.  Investigation: No additional findings.  Imaging: No results found.  Recent Labs: Lab Results  Component Value Date   WBC 8.4 10/11/2021   HGB 17.4 (H) 10/11/2021   PLT 237 10/11/2021   NA 141 10/11/2021   K 4.2 10/11/2021   CL 104 10/11/2021   CO2 29 10/11/2021   GLUCOSE 92 10/11/2021   BUN 18 10/11/2021   CREATININE 1.18 10/11/2021   BILITOT 0.7 10/11/2021   ALKPHOS 96 04/02/2017   AST 19 10/11/2021   ALT 20 10/11/2021   PROT 6.7 10/11/2021   ALBUMIN 4.5 04/02/2017   CALCIUM 9.7 10/11/2021   GFRAA 98 09/09/2020   QFTBGOLDPLUS NEGATIVE 04/05/2021    Speciality Comments:   FU every 3 months  Procedures:  No procedures performed Allergies: Patient has no known allergies.    Assessment / Plan:     Visit Diagnoses: Spondyloarthritis: He has not had any signs or symptoms of a flare.  He has no synovitis or dactylitis on examination today.  T-spine is slightly limited range of motion.  No midline spinal tenderness or SI joint tenderness upon palpation.  Overall he is clinically been doing well on Humira 40 mg subcutaneous injections every 14 days.  He continues to tolerate Humira without any side effects and has not missed any doses recently.  He has not had any Achilles tendinitis or plantar fasciitis.  He has not had any nocturnal pain or difficulty with ADLs.  He remains active working full-time in [employer]. He will remain on Humira as monotherapy.  He was advised to notify us if he develops signs or symptoms of a flare.  He will follow-up in the office in 3 months or sooner if needed.  HLA B27 (HLA B27 positive)  High risk medication use - Humira 40 mg sq injections every 14 days.  - Plan: COMPLETE METABOLIC PANEL WITH GFR, CBC with Differential/Platelet, QuantiFERON-TB Gold Plus CBC and CMP updated on 10/20/21.  Orders for CBC and CMP released today.  His next lab work will be due in September and every 3 months.  TB gold negative on 04/05/21. Future order for TB gold placed today.  Discussed the importance of holding humira if he develops signs or symptoms of an infection and to resume once the infection has completely cleared.  Discussed the importance of yearly skin examinations while on Humira.  He is scheduled for an appointment with his dermatologist in 2 weeks. No new medical conditions.   Screening for tuberculosis -Future order for TB Gold placed today.  Plan: QuantiFERON-TB Gold Plus  Chronic right shoulder pain: Resolved.  Good range of motion with no discomfort or tenderness.  Chronic SI joint pain: He underwent bilateral SI joint cortisone injections on 07/10/2021 which alleviated his discomfort.  He has no SI joint tenderness upon palpation today.   No nocturnal pain.  His morning stiffness continues to last for about 1 hour.  He will remain on Humira as prescribed.  DDD (degenerative disc disease), lumbar - He has been followed by back specialist: Dr. Francesco Runner.  He had radiofrequency ablation x3.  Other medical conditions are listed as follows:   Personal history of chronic sinusitis  History of migraine  Vitamin D deficiency  Tinea versicolor    Orders: Orders Placed This Encounter  Procedures   COMPLETE METABOLIC PANEL WITH GFR   CBC with Differential/Platelet   QuantiFERON-TB Gold Plus   No orders of the defined types were placed in this encounter.   Follow-Up Instructions: Return in about 3 months (around 04/13/2022) for Spondyloarthropathy.   Ofilia Neas, PA-C  Note - This record has been created using Dragon software.  Chart creation errors have been sought, but may not always  have been located. Such creation errors do not reflect on  the standard of medical care.

## 2022-01-07 DIAGNOSIS — J019 Acute sinusitis, unspecified: Secondary | ICD-10-CM | POA: Diagnosis not present

## 2022-01-07 DIAGNOSIS — J209 Acute bronchitis, unspecified: Secondary | ICD-10-CM | POA: Diagnosis not present

## 2022-01-11 ENCOUNTER — Encounter: Payer: Self-pay | Admitting: Physician Assistant

## 2022-01-11 ENCOUNTER — Ambulatory Visit (INDEPENDENT_AMBULATORY_CARE_PROVIDER_SITE_OTHER): Payer: BC Managed Care – PPO | Admitting: Physician Assistant

## 2022-01-11 VITALS — BP 135/92 | HR 59 | Ht 69.0 in | Wt 210.0 lb

## 2022-01-11 DIAGNOSIS — Z1589 Genetic susceptibility to other disease: Secondary | ICD-10-CM | POA: Diagnosis not present

## 2022-01-11 DIAGNOSIS — M25511 Pain in right shoulder: Secondary | ICD-10-CM

## 2022-01-11 DIAGNOSIS — M47819 Spondylosis without myelopathy or radiculopathy, site unspecified: Secondary | ICD-10-CM

## 2022-01-11 DIAGNOSIS — M5136 Other intervertebral disc degeneration, lumbar region: Secondary | ICD-10-CM

## 2022-01-11 DIAGNOSIS — M533 Sacrococcygeal disorders, not elsewhere classified: Secondary | ICD-10-CM

## 2022-01-11 DIAGNOSIS — Z79899 Other long term (current) drug therapy: Secondary | ICD-10-CM

## 2022-01-11 DIAGNOSIS — Z8709 Personal history of other diseases of the respiratory system: Secondary | ICD-10-CM

## 2022-01-11 DIAGNOSIS — G8929 Other chronic pain: Secondary | ICD-10-CM

## 2022-01-11 DIAGNOSIS — E559 Vitamin D deficiency, unspecified: Secondary | ICD-10-CM

## 2022-01-11 DIAGNOSIS — B360 Pityriasis versicolor: Secondary | ICD-10-CM

## 2022-01-11 DIAGNOSIS — Z111 Encounter for screening for respiratory tuberculosis: Secondary | ICD-10-CM

## 2022-01-11 DIAGNOSIS — Z8669 Personal history of other diseases of the nervous system and sense organs: Secondary | ICD-10-CM

## 2022-01-11 NOTE — Patient Instructions (Signed)
Standing Labs We placed an order today for your standing lab work.   Please have your standing labs drawn in September and every 3 months   If possible, please have your labs drawn 2 weeks prior to your appointment so that the provider can discuss your results at your appointment.  Please note that you may see your imaging and lab results in MyChart before we have reviewed them. We may be awaiting multiple results to interpret others before contacting you. Please allow our office up to 72 hours to thoroughly review all of the results before contacting the office for clarification of your results.  We have open lab daily: Monday through Thursday from 1:30-4:30 PM and Friday from 1:30-4:00 PM at the office of Dr. Shaili Deveshwar, [facility].   Please be advised, all patients with office appointments requiring lab work will take precedent over walk-in lab work.  If possible, please come for your lab work on Monday and Friday afternoons, as you may experience shorter wait times. The office is located at [address], [address], [place], [place] [address] No appointment is necessary.   Labs are drawn by [facility]. Please bring your co-pay at the time of your lab draw.  You may receive a bill from [facility] for your lab work.  Please note if you are on Hydroxychloroquine and and an order has been placed for a Hydroxychloroquine level, you will need to have it drawn 4 hours or more after your last dose.  If you wish to have your labs drawn at another location, please call the office 24 hours in advance to send orders.  If you have any questions regarding directions or hours of operation,  please call [phone].   As a reminder, please drink plenty of water prior to coming for your lab work. Thanks!  

## 2022-01-12 LAB — CBC WITH DIFFERENTIAL/PLATELET
Absolute Monocytes: 837 cells/uL (ref 200–950)
Basophils Absolute: 74 cells/uL (ref 0–200)
Basophils Relative: 0.7 %
Eosinophils Absolute: 276 cells/uL (ref 15–500)
Eosinophils Relative: 2.6 %
HCT: 48.9 % (ref 38.5–50.0)
Hemoglobin: 16.8 g/dL (ref 13.2–17.1)
Lymphs Abs: 3689 cells/uL (ref 850–3900)
MCH: 32.2 pg (ref 27.0–33.0)
MCHC: 34.4 g/dL (ref 32.0–36.0)
MCV: 93.9 fL (ref 80.0–100.0)
MPV: 11 fL (ref 7.5–12.5)
Monocytes Relative: 7.9 %
Neutro Abs: 5724 cells/uL (ref 1500–7800)
Neutrophils Relative %: 54 %
Platelets: 230 10*3/uL (ref 140–400)
RBC: 5.21 10*6/uL (ref 4.20–5.80)
RDW: 12.7 % (ref 11.0–15.0)
Total Lymphocyte: 34.8 %
WBC: 10.6 10*3/uL (ref 3.8–10.8)

## 2022-01-12 LAB — COMPLETE METABOLIC PANEL WITH GFR
AG Ratio: 1.9 (calc) (ref 1.0–2.5)
ALT: 23 U/L (ref 9–46)
AST: 16 U/L (ref 10–40)
Albumin: 4.2 g/dL (ref 3.6–5.1)
Alkaline phosphatase (APISO): 70 U/L (ref 36–130)
BUN: 19 mg/dL (ref 7–25)
CO2: 28 mmol/L (ref 20–32)
Calcium: 9.3 mg/dL (ref 8.6–10.3)
Chloride: 106 mmol/L (ref 98–110)
Creat: 1.06 mg/dL (ref 0.60–1.29)
Globulin: 2.2 g/dL (calc) (ref 1.9–3.7)
Glucose, Bld: 87 mg/dL (ref 65–99)
Potassium: 3.8 mmol/L (ref 3.5–5.3)
Sodium: 144 mmol/L (ref 135–146)
Total Bilirubin: 0.5 mg/dL (ref 0.2–1.2)
Total Protein: 6.4 g/dL (ref 6.1–8.1)
eGFR: 88 mL/min/{1.73_m2} (ref >=60)

## 2022-01-12 NOTE — Progress Notes (Signed)
CBC and CMP WNL

## 2022-01-16 DIAGNOSIS — M47816 Spondylosis without myelopathy or radiculopathy, lumbar region: Secondary | ICD-10-CM | POA: Diagnosis not present

## 2022-01-16 DIAGNOSIS — Z5181 Encounter for therapeutic drug level monitoring: Secondary | ICD-10-CM | POA: Diagnosis not present

## 2022-01-16 DIAGNOSIS — M5459 Other low back pain: Secondary | ICD-10-CM | POA: Diagnosis not present

## 2022-01-16 DIAGNOSIS — M461 Sacroiliitis, not elsewhere classified: Secondary | ICD-10-CM | POA: Diagnosis not present

## 2022-01-29 ENCOUNTER — Ambulatory Visit (INDEPENDENT_AMBULATORY_CARE_PROVIDER_SITE_OTHER): Payer: BC Managed Care – PPO

## 2022-01-29 ENCOUNTER — Encounter: Payer: Self-pay | Admitting: Emergency Medicine

## 2022-01-29 ENCOUNTER — Ambulatory Visit
Admission: EM | Admit: 2022-01-29 | Discharge: 2022-01-29 | Disposition: A | Discharge status: Home / Self Care | Payer: BC Managed Care – PPO | Attending: Urgent Care | Admitting: Urgent Care

## 2022-01-29 DIAGNOSIS — R079 Chest pain, unspecified: Secondary | ICD-10-CM | POA: Diagnosis not present

## 2022-01-29 DIAGNOSIS — F17200 Nicotine dependence, unspecified, uncomplicated: Secondary | ICD-10-CM | POA: Diagnosis not present

## 2022-01-29 DIAGNOSIS — R9431 Abnormal electrocardiogram [ECG] [EKG]: Secondary | ICD-10-CM | POA: Diagnosis not present

## 2022-01-29 DIAGNOSIS — R0602 Shortness of breath: Secondary | ICD-10-CM

## 2022-01-29 DIAGNOSIS — J209 Acute bronchitis, unspecified: Secondary | ICD-10-CM

## 2022-01-29 MED ORDER — PREDNISONE 50 MG PO TABS: 50.0000 mg | ORAL_TABLET | Freq: Every day | ORAL | 0 refills | Status: DC

## 2022-01-29 NOTE — ED Provider Notes (Signed)
[facility]   MRN: [id number] DOB: 1976/01/31  Subjective:   Dennis Todd is a 46 y.o. male presenting for 1 week history of persistent shortness of breath, chest tightness, chest pain.  Patient was seen and treated at the end of May for an infection and completed a course of Augmentin and prednisone.  Symptoms returned shortly thereafter he finished the medications.  He is a smoker, does 1 pack/day.  No heart history.  He is on Humira for spondyloarthritis.  No current facility-administered medications for this encounter.  Current Outpatient Medications:    Adalimumab (HUMIRA PEN) 40 MG/0.4ML PNKT, Inject 40 mg into the skin every 14 (fourteen) days., Disp: 2 each, Rfl: 2   amoxicillin-clavulanate (AUGMENTIN) 875-125 MG tablet, Take 1 tablet by mouth 2 (two) times daily., Disp: , Rfl:    Aspirin-Acetaminophen-Caffeine (GOODY HEADACHE PO), Take by mouth as needed., Disp: , Rfl:    predniSONE (STERAPRED UNI-PAK 21 TAB) 10 MG (21) TBPK tablet, Take by mouth., Disp: , Rfl:    tiZANidine HCl (ZANAFLEX PO), Take by mouth as needed., Disp: , Rfl:    traMADol (ULTRAM) 50 MG tablet, Take 50 mg by mouth daily as needed., Disp: , Rfl:    No Known Allergies  Past Medical History:  Diagnosis Date   Arthritis    Migraine      Past Surgical History:  Procedure Laterality Date   ANKLE SURGERY Left    back ablation     back injections     NASAL SINUS SURGERY      Family History  Problem Relation Age of Onset   Diabetes Father    Rheum arthritis Brother    Healthy Daughter     Social History   Tobacco Use   Smoking status: Former    Packs/day: 1.00    Years: 10.00    Total pack years: 10.00    Types: Cigarettes    Quit date: 08/13/2006    Years since quitting: 15.4    Passive exposure: Current   Smokeless tobacco: Former    Types: Snuff    Quit date: 08/13/2006  Vaping Use   Vaping Use: Never used  Substance Use Topics   Alcohol use: No   Drug use:  Never    ROS   Objective:   Vitals: BP 135/80   Pulse 77   Temp 97.9 F (36.6 C)   Resp 20   SpO2 98%   Physical Exam Constitutional:      General: He is not in acute distress.    Appearance: Normal appearance. He is well-developed. He is not ill-appearing, toxic-appearing or diaphoretic.  HENT:     Head: Normocephalic and atraumatic.     Right Ear: External ear normal.     Left Ear: External ear normal.     Nose: Nose normal.     Mouth/Throat:     Mouth: Mucous membranes are moist.  Eyes:     General: No scleral icterus.       Right eye: No discharge.        Left eye: No discharge.     Extraocular Movements: Extraocular movements intact.  Cardiovascular:     Rate and Rhythm: Normal rate and regular rhythm.     Heart sounds: Normal heart sounds. No murmur heard.    No friction rub. No gallop.  Pulmonary:     Effort: Pulmonary effort is normal. No tachypnea, accessory muscle usage or respiratory distress.  Breath sounds: No stridor. Decreased breath sounds present. No wheezing, rhonchi or rales.  Neurological:     Mental Status: He is alert and oriented to person, place, and time.  Psychiatric:        Mood and Affect: Mood normal.        Behavior: Behavior normal.        Thought Content: Thought content normal.     DG Chest 2 View  Result Date: 01/29/2022 CLINICAL DATA:  Chest pain, shortness of breath EXAM: CHEST - 2 VIEW COMPARISON:  01/06/2009 FINDINGS: Cardiac size is within normal limits. There are no signs of pulmonary edema or focal pulmonary consolidation. There is peribronchial thickening. There is no pleural effusion or pneumothorax. IMPRESSION: Peribronchial thickening suggests bronchitis. There are no signs of pulmonary edema or focal pulmonary consolidation. Electronically Signed   By: Ernie Avena M.D.   On: 01/29/2022 11:21    ED ECG REPORT   Date: 01/29/2022  EKG Time: 11:40 AM  Rate: 77bpm  Rhythm: normal sinus rhythm,  there are no  previous tracings available for comparison  Axis: Right  Intervals:right bundle branch block  ST&T Change: Nonspecific T wave inversion in lead III, aVF  Narrative Interpretation: Sinus rhythm at 77 bpm with a right bundle branch block, short PR.  Nonspecific T wave changes as above.  No previous EKG available for comparison.   Assessment and Plan :   PDMP not reviewed this encounter.  1. Acute bronchitis, unspecified organism   2. Smoker   3. Nonspecific abnormal electrocardiogram (ECG) (EKG)    Recommended managing for acute bronchitis with prednisone course and supportive care.  Emphasized need to quit smoking.  Has an abnormal EKG and cardiac symptoms and therefore warrants cardiac work-up that is nonemergent given hemodynamically stable vital signs, no acute findings on ekg. Emphasized need to establish care with a PCP and provided him with information to do so.  Counseled patient on potential for adverse effects with medications prescribed/recommended today, ER and return-to-clinic precautions discussed, patient verbalized understanding.    Wallis Bamberg, PA-C 01/29/22 1141

## 2022-01-29 NOTE — ED Triage Notes (Signed)
Pt here with intermittent SOB and central chest pain x 1 week. Pt is a smoker.

## 2022-02-01 ENCOUNTER — Encounter: Payer: Self-pay | Admitting: Cardiovascular Disease

## 2022-02-01 ENCOUNTER — Ambulatory Visit (INDEPENDENT_AMBULATORY_CARE_PROVIDER_SITE_OTHER): Payer: BC Managed Care – PPO | Admitting: Cardiovascular Disease

## 2022-02-01 VITALS — BP 130/72 | HR 72 | Ht 69.0 in | Wt 210.8 lb

## 2022-02-01 DIAGNOSIS — R072 Precordial pain: Secondary | ICD-10-CM

## 2022-02-01 DIAGNOSIS — I4510 Unspecified right bundle-branch block: Secondary | ICD-10-CM

## 2022-02-01 DIAGNOSIS — B078 Other viral warts: Secondary | ICD-10-CM | POA: Diagnosis not present

## 2022-02-01 MED ORDER — METOPROLOL TARTRATE 100 MG PO TABS: ORAL_TABLET | ORAL | 0 refills | Status: DC

## 2022-02-01 NOTE — Progress Notes (Signed)
Cardiology Office Note:   Date:  02/01/2022  NAME:  Dennis Todd    MRN: [id number] DOB:  Oct 12, 1975   PCP:  Medicine, [facility]  Cardiologist:  None  Electrophysiologist:  None   Referring MD: Wallis Bamberg, PA-C   Chief Complaint  Patient presents with   Chest Pain    History of Present Illness:   Dennis Todd is a 46 y.o. male with a hx of arthritis, bronchitis who is being seen today for the evaluation of right bundle branch block/chest pain at the request of Wallis Bamberg, [place].  He reports for the last week he has had episodes of pressure.  Described as pressure in his center chest that occurs with heavy activity.  Works in Producer, television/film/video.  Apparently he was doing a job that required heavy activity.  He reports intense pressure with this activity.  He also had cough and shortness of breath.  He was seen in the emergency room and ruled out for an acute coronary syndrome.  Chest x-ray was concerning for possible acute bronchitis.  He was placed on prednisone with some improvement in symptoms.  He apparently had symptoms earlier in June and was given a short course of steroids and antibiotics and this helped him initially but symptoms returned.  He does have ankylosing spondylitis and sees rheumatology.  He is on Humira.  He is a current everyday smoker.  Smoked for 2-1/2 years.  Reports alcohol use in moderation.  No drug use.  He reports no positional component.  Seems to be triggered by exertion and alleviated by rest.  No fevers.  No chills.  He does have a nonproductive cough.  Recently evaluated by rheumatology and tuberculosis testing was negative.  He reports his brother died of a stroke.  He reports his father has diabetes but no strong family history of heart disease.  He reports he does not exercise but gets plenty of exercise with work.  CV exam unremarkable.  EKG does demonstrate a right bundle branch block.  He is married.  He reports 1 daughter.  No recent  change in medications.  Symptoms have improved prednisone.  Problem List Ankylosing spondylitis   Past Medical History: Past Medical History:  Diagnosis Date   Arthritis    Migraine     Past Surgical History: Past Surgical History:  Procedure Laterality Date   ANKLE SURGERY Left    back ablation     back injections     NASAL SINUS SURGERY      Current Medications: Current Meds  Medication Sig   Adalimumab (HUMIRA PEN) 40 MG/0.4ML PNKT Inject 40 mg into the skin every 14 (fourteen) days.   Aspirin-Acetaminophen-Caffeine (GOODY HEADACHE PO) Take by mouth as needed.   metoprolol tartrate (LOPRESSOR) 100 MG tablet Take 1 tablet by mouth once for procedure.   predniSONE (DELTASONE) 50 MG tablet Take 1 tablet (50 mg total) by mouth daily with breakfast.   tiZANidine HCl (ZANAFLEX PO) Take by mouth as needed.   traMADol (ULTRAM) 50 MG tablet Take 50 mg by mouth daily as needed.     Allergies:    Patient has no known allergies.   Social History: Social History   Socioeconomic History   Marital status: Married    Spouse name: Not on file   Number of children: 1   Years of education: Not on file   Highest education level: Not on file  Occupational History   Occupation: Bath remodeling  Tobacco  Use   Smoking status: Former    Packs/day: 1.00    Years: 10.00    Total pack years: 10.00    Types: Cigarettes    Quit date: 08/13/2006    Years since quitting: 15.4    Passive exposure: Current   Smokeless tobacco: Former    Types: Snuff    Quit date: 08/13/2006  Vaping Use   Vaping Use: Never used  Substance and Sexual Activity   Alcohol use: No   Drug use: Never   Sexual activity: Yes  Other Topics Concern   Not on file  Social History Narrative   Not on file   Social Determinants of Health   Financial Resource Strain: Not on file  Food Insecurity: Not on file  Transportation Needs: Not on file  Physical Activity: Not on file  Stress: Not on file  Social  Connections: Not on file     Family History: The patient's family history includes Diabetes in his father; Healthy in his daughter; Rheum arthritis in his brother; Stroke in his brother.  ROS:   All other ROS reviewed and negative. Pertinent positives noted in the HPI.     EKGs/Labs/Other Studies Reviewed:   The following studies were personally reviewed by me today:  EKG:  EKG is ordered today.  The ekg ordered today demonstrates normal sinus rhythm heart rate 71, right bundle branch block, no acute ischemic changes, and was personally reviewed by me.   Recent Labs: 01/11/2022: ALT 23; BUN 19; Creat 1.06; Hemoglobin 16.8; Platelets 230; Potassium 3.8; Sodium 144   Recent Lipid Panel No results found for: "CHOL", "TRIG", "HDL", "CHOLHDL", "VLDL", "LDLCALC", "LDLDIRECT"  Physical Exam:   VS:  BP 130/72   Pulse 72   Ht 5\' 9"  (1.753 m)   Wt 210 lb 12.8 oz (95.6 kg)   SpO2 97%   BMI 31.13 kg/m    Wt Readings from Last 3 Encounters:  02/01/22 210 lb 12.8 oz (95.6 kg)  01/11/22 210 lb (95.3 kg)  10/11/21 207 lb (93.9 kg)    General: Well nourished, well developed, in no acute distress Head: Atraumatic, normal size  Eyes: PEERLA, EOMI  Neck: Supple, no JVD Endocrine: No thryomegaly Cardiac: Normal S1, S2; RRR; no murmurs, rubs, or gallops Lungs: Clear to auscultation bilaterally, no wheezing, rhonchi or rales  Abd: Soft, nontender, no hepatomegaly  Ext: No edema, pulses 2+ Musculoskeletal: No deformities, BUE and BLE strength normal and equal Skin: Warm and dry, no rashes   Neuro: Alert and oriented to person, place, time, and situation, CNII-XII grossly intact, no focal deficits  Psych: Normal mood and affect   ASSESSMENT:   Dennis Todd is a 46 y.o. male who presents for the following: 1. Precordial pain   2. RBBB     PLAN:   1. Precordial pain -Reports exertional chest pressure.  Symptoms occur with activity and are alleviated by rest.  Symptoms are a bit  conflicting as they are improved with prednisone.  His EKG demonstrates sinus rhythm with a right bundle branch block.  Recent emergency room visit with negative high-sensitivity troponins.  No strong family history of heart disease but symptoms do sound like stable angina.  Would recommend coronary CTA.  He has obtained a BMP through his rheumatologist.  He will take 100 mg of metoprolol tartrate 2 hours before the scan.  This will also get a good look at the lung architecture determine if any other pathology is going on here.  For  now he is okay to work and exercise.  We will follow-up after his CT scan.  I would like to also obtain an echocardiogram given the right bundle branch block.  No signs of heart failure.  Suspect this was incidentally found.  2. RBBB -Echo as above.  Disposition: Return if symptoms worsen or fail to improve.  Medication Adjustments/Labs and Tests Ordered: Current medicines are reviewed at length with the patient today.  Concerns regarding medicines are outlined above.  Orders Placed This Encounter  Procedures   CT CORONARY MORPH W/CTA COR W/SCORE W/CA W/CM &/OR WO/CM   EKG 12-Lead   ECHOCARDIOGRAM COMPLETE   Meds ordered this encounter  Medications   metoprolol tartrate (LOPRESSOR) 100 MG tablet    Sig: Take 1 tablet by mouth once for procedure.    Dispense:  1 tablet    Refill:  0    Patient Instructions  Medication Instructions:  Take Metoprolol 100 mg two hours before CT when scheduled.   *If you need a refill on your cardiac medications before your next appointment, please call your pharmacy*   Testing/Procedures: Your physician has requested that you have cardiac CT. Cardiac computed tomography (CT) is a painless test that uses an x-ray machine to take clear, detailed pictures of your heart. For further information please visit [email or web address] Please follow instruction sheet as given.  Echocardiogram - Your physician has requested that you have  an echocardiogram. Echocardiography is a painless test that uses sound waves to create images of your heart. It provides your doctor with information about the size and shape of your heart and how well your heart's chambers and valves are working. This procedure takes approximately one hour. There are no restrictions for this procedure.     Follow-Up: At [facility], you and your health needs are our priority.  As part of our continuing mission to provide you with exceptional heart care, we have created designated Provider Care Teams.  These Care Teams include your primary Cardiologist (physician) and Advanced Practice Providers (APPs -  Physician Assistants and Nurse Practitioners) who all work together to provide you with the care you need, when you need it.  We recommend signing up for the patient portal called "MyChart".  Sign up information is provided on this After Visit Summary.  MyChart is used to connect with patients for Virtual Visits (Telemedicine).  Patients are able to view lab/test results, encounter notes, upcoming appointments, etc.  Non-urgent messages can be sent to your provider as well.   To learn more about what you can do with MyChart, go to [email or web address].    Your next appointment:   As needed  The format for your next appointment:   In Person  Provider:   Lennie Odor, MD    Other Instructions   Your cardiac CT will be scheduled at one of the below locations:   [facility] [address] [place], [place] [address] [phone]   If scheduled at [facility], please arrive at the [facility] and Children's Entrance (Entrance C2) of [facility] 30 minutes prior to test start time. You can use the FREE valet parking offered at entrance C (encouraged to control the heart rate for the test)  Proceed to the [facility] Radiology Department (first floor) to check-in and test prep.  All radiology patients and guests should use  entrance C2 at [facility], accessed from [facility], even though the hospital's physical address listed is [address]  [place] [employer].      Please follow these instructions carefully (unless otherwise directed):  Hold all erectile dysfunction medications at least 3 days (72 hrs) prior to test.  On the Night Before the Test: Be sure to Drink plenty of water. Do not consume any caffeinated/decaffeinated beverages or chocolate 12 hours prior to your test. Do not take any antihistamines 12 hours prior to your test.  On the Day of the Test: Drink plenty of water until 1 hour prior to the test. Do not eat any food 4 hours prior to the test. You may take your regular medications prior to the test.  Take metoprolol (Lopressor) two hours prior to test. HOLD Furosemide/Hydrochlorothiazide morning of the test.      After the Test: Drink plenty of water. After receiving IV contrast, you may experience a mild flushed feeling. This is normal. On occasion, you may experience a mild rash up to 24 hours after the test. This is not dangerous. If this occurs, you can take Benadryl 25 mg and increase your fluid intake. If you experience trouble breathing, this can be serious. If it is severe call 911 IMMEDIATELY. If it is mild, please call our office. If you take any of these medications: Glipizide/Metformin, Avandament, Glucavance, please do not take 48 hours after completing test unless otherwise instructed.  We will call to schedule your test 2-4 weeks out understanding that some insurance companies will need an authorization prior to the service being performed.   For non-scheduling related questions, please contact the cardiac imaging nurse navigator should you have any questions/concerns: Rockwell Alexandria, Cardiac Imaging Nurse Navigator Larey Brick, Cardiac Imaging Nurse Navigator [facility] Direct Office Dial: [phone]   For scheduling needs,  including cancellations and rescheduling, please call [place], [phone].           Signed, Lenna Gilford. Flora Lipps, MD, [facility]  [facility]  [address] [place], [place] [address] [phone]  02/01/2022 2:27 PM

## 2022-02-01 NOTE — Patient Instructions (Signed)
Medication Instructions:  Take Metoprolol 100 mg two hours before CT when scheduled.   *If you need a refill on your cardiac medications before your next appointment, please call your pharmacy*   Testing/Procedures: Your physician has requested that you have cardiac CT. Cardiac computed tomography (CT) is a painless test that uses an x-ray machine to take clear, detailed pictures of your heart. For further information please visit [email or web address] Please follow instruction sheet as given.  Echocardiogram - Your physician has requested that you have an echocardiogram. Echocardiography is a painless test that uses sound waves to create images of your heart. It provides your doctor with information about the size and shape of your heart and how well your heart's chambers and valves are working. This procedure takes approximately one hour. There are no restrictions for this procedure.     Follow-Up: At [facility], you and your health needs are our priority.  As part of our continuing mission to provide you with exceptional heart care, we have created designated Provider Care Teams.  These Care Teams include your primary Cardiologist (physician) and Advanced Practice Providers (APPs -  Physician Assistants and Nurse Practitioners) who all work together to provide you with the care you need, when you need it.  We recommend signing up for the patient portal called "MyChart".  Sign up information is provided on this After Visit Summary.  MyChart is used to connect with patients for Virtual Visits (Telemedicine).  Patients are able to view lab/test results, encounter notes, upcoming appointments, etc.  Non-urgent messages can be sent to your provider as well.   To learn more about what you can do with MyChart, go to [email or web address].    Your next appointment:   As needed  The format for your next appointment:   In Person  Provider:   Lennie Odor, MD    Other  Instructions   Your cardiac CT will be scheduled at one of the below locations:   [facility] [address] [place], [place] [address] [phone]   If scheduled at [facility], please arrive at the [facility] and Children's Entrance (Entrance C2) of [facility] 30 minutes prior to test start time. You can use the FREE valet parking offered at entrance C (encouraged to control the heart rate for the test)  Proceed to the [facility] Radiology Department (first floor) to check-in and test prep.  All radiology patients and guests should use entrance C2 at [facility], accessed from [facility], even though the hospital's physical address listed is [address].      Please follow these instructions carefully (unless otherwise directed):  Hold all erectile dysfunction medications at least 3 days (72 hrs) prior to test.  On the Night Before the Test: Be sure to Drink plenty of water. Do not consume any caffeinated/decaffeinated beverages or chocolate 12 hours prior to your test. Do not take any antihistamines 12 hours prior to your test.  On the Day of the Test: Drink plenty of water until 1 hour prior to the test. Do not eat any food 4 hours prior to the test. You may take your regular medications prior to the test.  Take metoprolol (Lopressor) two hours prior to test. HOLD Furosemide/Hydrochlorothiazide morning of the test.      After the Test: Drink plenty of water. After receiving IV contrast, you may experience a mild flushed feeling. This is normal. On occasion, you may experience a mild rash  up to 24 hours after the test. This is not dangerous. If this occurs, you can take Benadryl 25 mg and increase your fluid intake. If you experience trouble breathing, this can be serious. If it is severe call 911 IMMEDIATELY. If it is mild, please call our office. If you take any of these medications: Glipizide/Metformin,  Avandament, Glucavance, please do not take 48 hours after completing test unless otherwise instructed.  We will call to schedule your test 2-4 weeks out understanding that some insurance companies will need an authorization prior to the service being performed.   For non-scheduling related questions, please contact the cardiac imaging nurse navigator should you have any questions/concerns: Marchia Bond, Cardiac Imaging Nurse Navigator Gordy Clement, Cardiac Imaging Nurse Navigator [facility] Direct Office Dial: [phone]   For scheduling needs, including cancellations and rescheduling, please call [place], [phone].

## 2022-02-06 ENCOUNTER — Ambulatory Visit: Payer: BC Managed Care – PPO | Admitting: Family Medicine

## 2022-02-11 ENCOUNTER — Other Ambulatory Visit: Payer: Self-pay | Admitting: Rheumatology

## 2022-02-12 NOTE — Telephone Encounter (Signed)
Next Visit: 04/19/2022  Last Visit: 01/11/2022  Last Fill: 08/22/2021 CBC and CMP WNL  MM:[id number]  Current Dose per office note 01/11/2022: Humira 40 mg sq injections every 14 days.    Labs: 01/11/2022   TB Gold: 04/05/2022   TB Gold is negative.  Okay to refill Humira?

## 2022-02-20 ENCOUNTER — Ambulatory Visit (HOSPITAL_COMMUNITY): Payer: BC Managed Care – PPO | Attending: Cardiovascular Disease

## 2022-02-20 DIAGNOSIS — R072 Precordial pain: Secondary | ICD-10-CM | POA: Diagnosis not present

## 2022-02-20 LAB — ECHOCARDIOGRAM COMPLETE
Area-P 1/2: 3.72 cm2
S' Lateral: 2.9 cm

## 2022-02-22 ENCOUNTER — Telehealth (HOSPITAL_COMMUNITY): Payer: Self-pay | Admitting: *Deleted

## 2022-02-22 NOTE — Telephone Encounter (Signed)
Reaching out to patient to offer assistance regarding upcoming cardiac imaging study; pt verbalizes understanding of appt date/time, parking situation and where to check in, pre-test NPO status and medications ordered, and verified current allergies; name and call back number provided for further questions should they arise  Larey Brick RN Navigator Cardiac Imaging [facility] [phone] office [phone] cell  Patient to take 100mg  metoprolol tartrate two hours prior to his cardiac CT scan. He is aware to arrive at noon.

## 2022-02-23 ENCOUNTER — Ambulatory Visit (HOSPITAL_COMMUNITY)
Admission: RE | Admit: 2022-02-23 | Discharge: 2022-02-23 | Disposition: A | Discharge status: Home / Self Care | Payer: BC Managed Care – PPO | Source: Ambulatory Visit | Attending: Cardiovascular Disease | Admitting: Cardiovascular Disease

## 2022-02-23 DIAGNOSIS — R931 Abnormal findings on diagnostic imaging of heart and coronary circulation: Secondary | ICD-10-CM | POA: Diagnosis not present

## 2022-02-23 DIAGNOSIS — I2510 Atherosclerotic heart disease of native coronary artery without angina pectoris: Secondary | ICD-10-CM

## 2022-02-23 DIAGNOSIS — R072 Precordial pain: Secondary | ICD-10-CM

## 2022-02-23 MED ORDER — IOHEXOL 350 MG/ML SOLN
100.0000 mL | Freq: Once | INTRAVENOUS | Status: AC | PRN
  Administered 2022-02-23: 100 mL via INTRAVENOUS

## 2022-02-23 MED ORDER — NITROGLYCERIN 0.4 MG SL SUBL: 0.8000 mg | SUBLINGUAL_TABLET | Freq: Once | SUBLINGUAL | Status: AC

## 2022-02-23 MED ORDER — NITROGLYCERIN 0.4 MG SL SUBL
SUBLINGUAL_TABLET | SUBLINGUAL | Status: AC
  Administered 2022-02-23: 0.8 mg via SUBLINGUAL
  Filled 2022-02-23: qty 2

## 2022-02-26 ENCOUNTER — Other Ambulatory Visit (HOSPITAL_COMMUNITY): Payer: Self-pay | Admitting: Emergency Medicine

## 2022-02-26 ENCOUNTER — Ambulatory Visit (HOSPITAL_COMMUNITY)
Admission: RE | Admit: 2022-02-26 | Discharge: 2022-02-26 | Disposition: A | Discharge status: Home / Self Care | Payer: BC Managed Care – PPO | Source: Ambulatory Visit | Attending: Cardiology | Admitting: Cardiology

## 2022-02-26 ENCOUNTER — Ambulatory Visit (HOSPITAL_BASED_OUTPATIENT_CLINIC_OR_DEPARTMENT_OTHER)
Admission: RE | Admit: 2022-02-26 | Discharge: 2022-02-26 | Disposition: A | Discharge status: Home / Self Care | Payer: BC Managed Care – PPO | Source: Ambulatory Visit | Attending: Cardiology | Admitting: Cardiology

## 2022-02-26 DIAGNOSIS — R931 Abnormal findings on diagnostic imaging of heart and coronary circulation: Secondary | ICD-10-CM

## 2022-02-26 DIAGNOSIS — R072 Precordial pain: Secondary | ICD-10-CM | POA: Diagnosis not present

## 2022-02-27 ENCOUNTER — Other Ambulatory Visit: Payer: Self-pay

## 2022-02-27 DIAGNOSIS — R931 Abnormal findings on diagnostic imaging of heart and coronary circulation: Secondary | ICD-10-CM

## 2022-03-02 ENCOUNTER — Other Ambulatory Visit: Payer: Self-pay

## 2022-03-02 DIAGNOSIS — E785 Hyperlipidemia, unspecified: Secondary | ICD-10-CM | POA: Diagnosis not present

## 2022-03-02 DIAGNOSIS — R931 Abnormal findings on diagnostic imaging of heart and coronary circulation: Secondary | ICD-10-CM

## 2022-03-02 LAB — LIPID PANEL
Chol/HDL Ratio: 8.2 ratio — ABNORMAL HIGH (ref 0.0–5.0)
Cholesterol, Total: 237 mg/dL — ABNORMAL HIGH (ref 100–199)
HDL: 29 mg/dL — ABNORMAL LOW (ref >39)
LDL Chol Calc (NIH): 173 mg/dL — ABNORMAL HIGH (ref 0–99)
Triglycerides: 187 mg/dL — ABNORMAL HIGH (ref 0–149)
VLDL Cholesterol Cal: 35 mg/dL (ref 5–40)

## 2022-03-16 NOTE — Progress Notes (Signed)
Cardiology Office Note:   Date:  03/20/2022  NAME:  Dennis Todd    MRN: [id number] DOB:  1975/10/10   PCP:  Medicine, [facility]  Cardiologist:  None  Electrophysiologist:  None   Referring MD: Medicine, [facility]*   Chief Complaint  Patient presents with   Coronary Artery Disease   History of Present Illness:   Dennis Todd is a 46 y.o. male with a hx of CAD, hyperlipidemia, ankylosing spondylitis who presents for follow-up.  He reports his symptoms of chest pain or shortness of breath have resolved.  I suspect this was related to acute bronchitis.  We found an occluded nondominant RCA.  He has nonobstructive disease in the left system.  He has significant plaque based on plaque analysis.  We discussed starting aspirin 81 mg daily as well as Crestor.  He is doing a physical job.  He works in Producer, television/film/video.  He has continued to do this.  Symptoms have improved.  He has no high blood pressure.  There is no strong family history of heart disease.  I suspect some of his CAD could be related to his arthritic condition.  Inflammation is not good for the heart.  We discussed making sure this is well treated.  Overall mainstay of treatment is prevention.  Problem List CAD -CAC score 283 (98th percentile) -occluded non-dominant RCA -25-49% LAD/LCX -calcified plaque volume 58 mm3; non-calcified plaque volume 372 mm2; total plaque 430 mm2 (Grade 2) 2. HLD 3. Ankylosing Spondylitis   Past Medical History: Past Medical History:  Diagnosis Date   Arthritis    Migraine     Past Surgical History: Past Surgical History:  Procedure Laterality Date   ANKLE SURGERY Left    back ablation     back injections     NASAL SINUS SURGERY      Current Medications: Current Meds  Medication Sig   Aspirin-Acetaminophen-Caffeine (GOODY HEADACHE PO) Take by mouth as needed.   HUMIRA PEN 40 MG/0.4ML PNKT INJECT 40 MG UNDER THE SKIN EVERY 14 DAYS   rosuvastatin (CRESTOR)  40 MG tablet Take 1 tablet (40 mg total) by mouth daily.   tiZANidine HCl (ZANAFLEX PO) Take by mouth as needed.   traMADol (ULTRAM) 50 MG tablet Take 50 mg by mouth daily as needed.     Allergies:    Patient has no known allergies.   Social History: Social History   Socioeconomic History   Marital status: Married    Spouse name: Not on file   Number of children: 1   Years of education: Not on file   Highest education level: Not on file  Occupational History   Occupation: Bath remodeling  Tobacco Use   Smoking status: Former    Packs/day: 1.00    Years: 10.00    Total pack years: 10.00    Types: Cigarettes    Quit date: 08/13/2006    Years since quitting: 15.6    Passive exposure: Current   Smokeless tobacco: Former    Types: Snuff    Quit date: 08/13/2006  Vaping Use   Vaping Use: Never used  Substance and Sexual Activity   Alcohol use: No   Drug use: Never   Sexual activity: Yes  Other Topics Concern   Not on file  Social History Narrative   Not on file   Social Determinants of Health   Financial Resource Strain: Not on file  Food Insecurity: Not on file  Transportation Needs: Not  on file  Physical Activity: Not on file  Stress: Not on file  Social Connections: Not on file     Family History: The patient's family history includes Diabetes in his father; Healthy in his daughter; Rheum arthritis in his brother; Stroke in his brother.  ROS:   All other ROS reviewed and negative. Pertinent positives noted in the HPI.     EKGs/Labs/Other Studies Reviewed:   The following studies were personally reviewed by me today:  CT FFR 02/26/2022 IMPRESSION: 1.  Occluded nondominant RCA.   2.  Extensive disease in the LAD but no discrete severe stenosis.  TTE 02/20/2022  1. Left ventricular ejection fraction, by estimation, is 60 to 65%. The  left ventricle has normal function. The left ventricle has no regional  wall motion abnormalities. Left ventricular diastolic  parameters were  normal. The average left ventricular  global longitudinal strain is -22.4 %. The global longitudinal strain is  normal.   2. Right ventricular systolic function is normal. The right ventricular  size is normal.   3. The mitral valve is normal in structure. No evidence of mitral valve  regurgitation. No evidence of mitral stenosis.   4. The aortic valve is tricuspid. Aortic valve regurgitation is not  visualized. No aortic stenosis is present.   5. The inferior vena cava is normal in size with greater than 50%  respiratory variability, suggesting right atrial pressure of 3 mmHg.   Recent Labs: 01/11/2022: ALT 23; BUN 19; Creat 1.06; Hemoglobin 16.8; Platelets 230; Potassium 3.8; Sodium 144   Recent Lipid Panel    Component Value Date/Time   CHOL 237 (H) 03/02/2022 0947   TRIG 187 (H) 03/02/2022 0947   HDL 29 (L) 03/02/2022 0947   CHOLHDL 8.2 (H) 03/02/2022 0947   LDLCALC 173 (H) 03/02/2022 0947    Physical Exam:   VS:  BP 118/82   Pulse (!) 55   Ht 5\' 9"  (1.753 m)   Wt 210 lb 12.8 oz (95.6 kg)   SpO2 97%   BMI 31.13 kg/m    Wt Readings from Last 3 Encounters:  03/20/22 210 lb 12.8 oz (95.6 kg)  02/01/22 210 lb 12.8 oz (95.6 kg)  01/11/22 210 lb (95.3 kg)    General: Well nourished, well developed, in no acute distress Head: Atraumatic, normal size  Eyes: PEERLA, EOMI  Neck: Supple, no JVD Endocrine: No thryomegaly Cardiac: Normal S1, S2; RRR; no murmurs, rubs, or gallops Lungs: Clear to auscultation bilaterally, no wheezing, rhonchi or rales  Abd: Soft, nontender, no hepatomegaly  Ext: No edema, pulses 2+ Musculoskeletal: No deformities, BUE and BLE strength normal and equal Skin: Warm and dry, no rashes   Neuro: Alert and oriented to person, place, time, and situation, CNII-XII grossly intact, no focal deficits  Psych: Normal mood and affect   ASSESSMENT:   Dennis Todd is a 46 y.o. male who presents for the following: 1. Coronary artery  disease involving native coronary artery of native heart without angina pectoris   2. Mixed hyperlipidemia     PLAN:   1. Coronary artery disease involving native coronary artery of native heart without angina pectoris 2. Mixed hyperlipidemia -Calcium score 283 which is 90th percentile.  Total plaque volume 430 mm.  He has more noncalcified plaque volume based on plaque analysis.  He has an occluded nondominant RCA which does not require treatment.  He has nonobstructive disease based on CT FFR analysis in the left system.  Mainstay of treatment will  be prevention.  We will continue with aspirin 81 mg daily.  We will start Crestor 40 mg daily.  His goal LDL cholesterol should be less than 70.  We discussed diet and exercise.  He will start both of these.  Fast food up it seems to be an issue.  He will work on this.  We discussed meal prep or other ways to avoid eating poorly.  He will come back in 2 months.  We will recheck a fasting lipid.  We may consider plaque analysis in 2 years to reassess treatment.  Hopefully this will be more commercially available at that time.     Disposition: Return in about 2 months (around 05/20/2022).  Medication Adjustments/Labs and Tests Ordered: Current medicines are reviewed at length with the patient today.  Concerns regarding medicines are outlined above.  Orders Placed This Encounter  Procedures   Lipid panel   Meds ordered this encounter  Medications   rosuvastatin (CRESTOR) 40 MG tablet    Sig: Take 1 tablet (40 mg total) by mouth daily.    Dispense:  90 tablet    Refill:  3    Patient Instructions  Medication Instructions:  START Crestor 40 mg daily   *If you need a refill on your cardiac medications before your next appointment, please call your pharmacy*   Lab Work: LIPID (1 week before appointment in 2 months, no lab appointment needed, come fasting- nothing to eat or drink)   If you have labs (blood work) drawn today and your tests  are completely normal, you will receive your results only by: MyChart Message (if you have MyChart) OR A paper copy in the mail If you have any lab test that is abnormal or we need to change your treatment, we will call you to review the results.   Follow-Up: At [facility], you and your health needs are our priority.  As part of our continuing mission to provide you with exceptional heart care, we have created designated Provider Care Teams.  These Care Teams include your primary Cardiologist (physician) and Advanced Practice Providers (APPs -  Physician Assistants and Nurse Practitioners) who all work together to provide you with the care you need, when you need it.  We recommend signing up for the patient portal called "MyChart".  Sign up information is provided on this After Visit Summary.  MyChart is used to connect with patients for Virtual Visits (Telemedicine).  Patients are able to view lab/test results, encounter notes, upcoming appointments, etc.  Non-urgent messages can be sent to your provider as well.   To learn more about what you can do with MyChart, go to [email or web address].    Your next appointment:   2 month(s)  The format for your next appointment:   In Person  Provider:   Marjie Skiff, PA-C or Azalee Course, PA-C    Then, Lennie Odor, MD will plan to see you again in 12 month(s).{           Time Spent with Patient: I have spent a total of 25 minutes with patient reviewing hospital notes, telemetry, EKGs, labs and examining the patient as well as establishing an assessment and plan that was discussed with the patient.  > 50% of time was spent in direct patient care.  Signed, Lenna Gilford. Flora Lipps, MD, [facility]  [facility]  [address], [address] [place], [place] [address] [phone]  03/20/2022 8:59 AM

## 2022-03-20 ENCOUNTER — Encounter: Payer: Self-pay | Admitting: Cardiovascular Disease

## 2022-03-20 ENCOUNTER — Ambulatory Visit (INDEPENDENT_AMBULATORY_CARE_PROVIDER_SITE_OTHER): Payer: BC Managed Care – PPO | Admitting: Cardiovascular Disease

## 2022-03-20 VITALS — BP 118/82 | HR 55 | Ht 69.0 in | Wt 210.8 lb

## 2022-03-20 DIAGNOSIS — I2510 Atherosclerotic heart disease of native coronary artery without angina pectoris: Secondary | ICD-10-CM

## 2022-03-20 DIAGNOSIS — E782 Mixed hyperlipidemia: Secondary | ICD-10-CM

## 2022-03-20 MED ORDER — ROSUVASTATIN CALCIUM 40 MG PO TABS: 40.0000 mg | ORAL_TABLET | Freq: Every day | ORAL | 3 refills | Status: DC

## 2022-03-20 NOTE — Patient Instructions (Addendum)
Medication Instructions:  START Crestor 40 mg daily   *If you need a refill on your cardiac medications before your next appointment, please call your pharmacy*   Lab Work: LIPID (1 week before appointment in 2 months, no lab appointment needed, come fasting- nothing to eat or drink)   If you have labs (blood work) drawn today and your tests are completely normal, you will receive your results only by: MyChart Message (if you have MyChart) OR A paper copy in the mail If you have any lab test that is abnormal or we need to change your treatment, we will call you to review the results.   Follow-Up: At [facility], you and your health needs are our priority.  As part of our continuing mission to provide you with exceptional heart care, we have created designated Provider Care Teams.  These Care Teams include your primary Cardiologist (physician) and Advanced Practice Providers (APPs -  Physician Assistants and Nurse Practitioners) who all work together to provide you with the care you need, when you need it.  We recommend signing up for the patient portal called "MyChart".  Sign up information is provided on this After Visit Summary.  MyChart is used to connect with patients for Virtual Visits (Telemedicine).  Patients are able to view lab/test results, encounter notes, upcoming appointments, etc.  Non-urgent messages can be sent to your provider as well.   To learn more about what you can do with MyChart, go to [email or web address].    Your next appointment:   2 month(s)  The format for your next appointment:   In Person  Provider:   Marjie Skiff, PA-C or Azalee Course, PA-C    Then, Lennie Odor, MD will plan to see you again in 12 month(s).{

## 2022-04-06 NOTE — Progress Notes (Signed)
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 male with history of spondylarthritis and DDD.  Patient remains on Humira 40 mg sq injections every 14 days.  He continues to tolerate Humira without any side effects or injection site reactions.  He has not missed any doses of Humira recently.  He states over the past 1 month he has been experiencing increased discomfort in his neck.  No symptoms of radiculopathy.  He has noticed some discomfort especially with lateral rotation.  He is also had increased muscle tension and tightness in his neck as well.  He has not had a massage recently.  He denies any joint swelling at this time.  He denies any Achilles tendinitis or plantar fasciitis.  Denies any SI joint discomfort currently.  He continues to take tramadol as prescribed by his spine specialist Dr. Francesco Runner. Of note he was started on Crestor 3 to 4 weeks ago.  Overall he feels that he has been tolerating Crestor without any side effects.   Activities of Daily Living:  Patient reports morning stiffness for 1 hour.   Patient Reports nocturnal pain.  Difficulty dressing/grooming: Denies Difficulty climbing stairs: Denies Difficulty getting out of chair: Denies Difficulty using hands for taps, buttons, cutlery, and/or writing: Reports  Review of Systems  Constitutional:  Positive for fatigue.  HENT:  Negative for mouth sores and mouth dryness.   Eyes:  Negative for dryness.  Respiratory:  Negative for shortness of breath.   Cardiovascular:  Positive for chest pain. Negative for palpitations.  Gastrointestinal:  Negative for blood in stool, constipation and diarrhea.  Endocrine: Negative for increased  urination.  Genitourinary:  Negative for involuntary urination.  Musculoskeletal:  Positive for joint pain, joint pain, myalgias, morning stiffness, muscle tenderness and myalgias. Negative for gait problem, joint swelling and muscle weakness.  Skin:  Positive for hair loss. Negative for color change, rash and sensitivity to sunlight.  Allergic/Immunologic: Negative for susceptible to infections.  Neurological:  Negative for dizziness and headaches.  Hematological:  Negative for swollen glands.  Psychiatric/Behavioral:  Negative for depressed mood and sleep disturbance. The patient is not nervous/anxious.     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   HLA B27 (HLA B27 positive) 10/12/2016    Past Medical History:  Diagnosis Date   Arthritis    Migraine     Family History  Problem Relation Age of Onset   Diabetes Father    Rheum arthritis Brother    Stroke Brother    Healthy Daughter    Past Surgical History:  Procedure Laterality Date   ANKLE SURGERY Left    back ablation     back injections     NASAL SINUS SURGERY     Social History   Social History Narrative   Not on file   Immunization History  Administered Date(s) Administered   Influenza,inj,Quad  PF,6+ Mos 07/21/2015   Influenza-Unspecified 09/27/2016     Objective: Vital Signs: BP 118/80 (BP Location: Left Arm, Patient Position: Sitting, Cuff Size: Normal)   Pulse 61   Resp 15   Ht '5\' 9"'  (1.753 m)   Wt 211 lb (95.7 kg)   BMI 31.16 kg/m    Physical Exam Vitals and nursing note reviewed.  Constitutional:      Appearance: He is well-developed.  HENT:     Head: Normocephalic and atraumatic.  Eyes:     Conjunctiva/sclera: Conjunctivae normal.     Pupils: Pupils are equal, round, and reactive to  light.  Cardiovascular:     Rate and Rhythm: Normal rate and regular rhythm.     Heart sounds: Normal heart sounds.  Pulmonary:     Effort: Pulmonary effort is normal.     Breath sounds: Normal breath sounds.  Abdominal:     General: Bowel sounds are normal.     Palpations: Abdomen is soft.  Musculoskeletal:     Cervical back: Normal range of motion and neck supple.  Skin:    General: Skin is warm and dry.     Capillary Refill: Capillary refill takes less than 2 seconds.  Neurological:     Mental Status: He is alert and oriented to person, place, and time.  Psychiatric:        Behavior: Behavior normal.      Musculoskeletal Exam: C-spine has limited ROM with lateral rotation.  Trapezius muscle tension and tenderness bilaterally.  Limited ROM of lumbar spine.  No midline spinal tenderness.  No SI joint tenderness upon palpation.  Shoulder joints, elbow joints, wrist joints, MCPs, PIPs, DIPs have good range of motion with no synovitis.  Some tenderness over the right wrist but no synovitis was noted.  Complete fist formation noted bilaterally.  PIP and DIP thickening consistent with osteoarthritis of both hands.  Hip joints have good range of motion with no groin pain currently.  Knee joints have good range of motion with no warmth or effusion.  Ankle joints have good range of motion with no tenderness or joint swelling.  No evidence of Achilles tendinitis.  CDAI Exam: CDAI Score: -- Patient Global: --; Provider Global: -- Swollen: --; Tender: -- Joint Exam 04/19/2022   No joint exam has been documented for this visit   There is currently no information documented on the homunculus. Go to the Rheumatology activity and complete the homunculus joint exam.  Investigation: No additional findings.  Imaging: No results found.  Recent Labs: Lab Results  Component Value Date   WBC 10.6 01/11/2022   HGB 16.8 01/11/2022   PLT 230 01/11/2022   NA 144 01/11/2022   K 3.8 01/11/2022    CL 106 01/11/2022   CO2 28 01/11/2022   GLUCOSE 87 01/11/2022   BUN 19 01/11/2022   CREATININE 1.06 01/11/2022   BILITOT 0.5 01/11/2022   ALKPHOS 96 04/02/2017   AST 16 01/11/2022   ALT 23 01/11/2022   PROT 6.4 01/11/2022   ALBUMIN 4.5 04/02/2017   CALCIUM 9.3 01/11/2022   GFRAA 98 09/09/2020   QFTBGOLDPLUS NEGATIVE 04/05/2021    Speciality Comments:   FU every 3 months  Procedures:  Trigger Point Inj  Date/Time: 04/19/2022 8:45 AM  Performed by: Ofilia Neas, PA-C Authorized by: Ofilia Neas, PA-C   Consent Given by:  Patient Site marked: the procedure site was marked   Timeout: prior to procedure the correct patient, procedure, and site was verified  Indications:  Pain Total # of Trigger Points:  2 Location: neck   Needle Size:  27 G Approach:  Dorsal Medications #1:  0.5 mL lidocaine 1 %; 10 mg triamcinolone acetonide 40 MG/ML Medications #2:  0.5 mL lidocaine 1 %; 10 mg triamcinolone acetonide 40 MG/ML Patient tolerance:  Patient tolerated the procedure well with no immediate complications  Allergies: Patient has no known allergies.   Assessment / Plan:     Visit Diagnoses: Spondyloarthritis: He continues to experience intermittent arthralgias and joint stiffness.  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 He has no synovitis or dactylitis on examination today.  No midline spinal tenderness or SI joint tenderness upon palpation.  His morning stiffness has been lasting about 1 hour daily.  He has no evidence of Achilles tendinitis or plantar fasciitis.  For the past 1 month he has been experiencing increased pain and stiffness in his neck.  He has not had any symptoms of radiculopathy.  On examination today he has some discomfort with lateral rotation as well as trapezius muscle tension and tenderness bilaterally.  He has tried using a massage gun as well as cupping at home with minimal improvement.  Bilateral trigger  point injections were performed today to try to alleviate his symptoms.  He may benefit from maintenance massages in the future. Overall his disease remains stable on Humira 40 mg sq injections every 14 days.  He continues to tolerate Humira without any side effects or injection site reactions.  He has not been experiencing any recent or recurrent infections while on Humira.  No medication changes will be made at this time.  He was advised to notify us if he develops signs or symptoms of frequent flares.  He will follow-up in the office in 3 months or sooner if needed.  HLA B27 (HLA B27 positive)  High risk medication use - Humira 40 mg sq injections every 14 days. - Plan: CBC with Differential/Platelet, COMPLETE METABOLIC PANEL WITH GFR, QuantiFERON-TB Gold Plus CBC and CMP drawn on 01/11/22. Order for CBC and CMP released. His next lab work will be due in December and every 3 months.  TB gold negative on 04/05/21.  Order for TB gold released.  He has not had any recent or recurrent infections.  Discussed the importance of holding humira if he develops signs or symptoms of an infection and to resume once the infection has completely cleared.  Discussed the importance of yearly skin cancer screening while on Humira.  Screening for tuberculosis -Order for TB gold released today.  Plan: QuantiFERON-TB Gold Plus  Trapezius muscle spasm - He presents today with increased neck pain and stiffness.  He has not had any recent injury prior to the onset of symptoms.  On examination he has limited range of motion with lateral rotation.  He has trapezius muscle tension and tenderness noted.  No midline spinal tenderness or symptoms of radiculopathy at this time.  Different treatment options were discussed.  He requested trigger point injections today.  He tolerated the procedures well.  Procedure notes were completed above.  Aftercare was discussed.  Plan: Trigger Point Inj  Chronic right shoulder pain: Resolved.    Chronic SI joint pain: No SI joint tenderness upon palpation.  No nocturnal pain.   DDD (degenerative disc disease), lumbar:  No midline spinal tenderness.  No symptoms of radiculopathy.   Other medical conditions are listed as follows:   Personal history  of chronic sinusitis  History of migraine  Vitamin D deficiency  Tinea versicolor    Orders: Orders Placed This Encounter  Procedures   Trigger Point Inj   CBC with Differential/Platelet   COMPLETE METABOLIC PANEL WITH GFR   QuantiFERON-TB Gold Plus   No orders of the defined types were placed in this encounter.    Follow-Up Instructions: Return in about 3 months (around 07/19/2022) for Spondyloarthropathy, DDD.   Ofilia Neas, PA-C  Note - This record has been created using Dragon software.  Chart creation errors have been sought, but may not always  have been located. Such creation errors do not reflect on  the standard of medical care.

## 2022-04-19 ENCOUNTER — Encounter: Payer: Self-pay | Admitting: Physician Assistant

## 2022-04-19 ENCOUNTER — Ambulatory Visit: Payer: BC Managed Care – PPO | Attending: Physician Assistant | Admitting: Physician Assistant

## 2022-04-19 VITALS — BP 118/80 | HR 61 | Resp 15 | Ht 69.0 in | Wt 211.0 lb

## 2022-04-19 DIAGNOSIS — M47819 Spondylosis without myelopathy or radiculopathy, site unspecified: Secondary | ICD-10-CM

## 2022-04-19 DIAGNOSIS — M51369 Other intervertebral disc degeneration, lumbar region without mention of lumbar back pain or lower extremity pain: Secondary | ICD-10-CM

## 2022-04-19 DIAGNOSIS — Z79899 Other long term (current) drug therapy: Secondary | ICD-10-CM | POA: Diagnosis not present

## 2022-04-19 DIAGNOSIS — Z1589 Genetic susceptibility to other disease: Secondary | ICD-10-CM

## 2022-04-19 DIAGNOSIS — G8929 Other chronic pain: Secondary | ICD-10-CM

## 2022-04-19 DIAGNOSIS — Z111 Encounter for screening for respiratory tuberculosis: Secondary | ICD-10-CM | POA: Diagnosis not present

## 2022-04-19 DIAGNOSIS — M62838 Other muscle spasm: Secondary | ICD-10-CM

## 2022-04-19 DIAGNOSIS — M5136 Other intervertebral disc degeneration, lumbar region: Secondary | ICD-10-CM

## 2022-04-19 DIAGNOSIS — M25511 Pain in right shoulder: Secondary | ICD-10-CM

## 2022-04-19 DIAGNOSIS — M533 Sacrococcygeal disorders, not elsewhere classified: Secondary | ICD-10-CM

## 2022-04-19 DIAGNOSIS — Z8709 Personal history of other diseases of the respiratory system: Secondary | ICD-10-CM

## 2022-04-19 DIAGNOSIS — E559 Vitamin D deficiency, unspecified: Secondary | ICD-10-CM

## 2022-04-19 DIAGNOSIS — Z8669 Personal history of other diseases of the nervous system and sense organs: Secondary | ICD-10-CM

## 2022-04-19 DIAGNOSIS — B360 Pityriasis versicolor: Secondary | ICD-10-CM

## 2022-04-19 MED ORDER — TRIAMCINOLONE ACETONIDE 40 MG/ML IJ SUSP
10.0000 mg | INTRAMUSCULAR | Status: AC | PRN
  Administered 2022-04-19: 10 mg via INTRAMUSCULAR

## 2022-04-19 MED ORDER — LIDOCAINE HCL 1 % IJ SOLN
0.5000 mL | INTRAMUSCULAR | Status: AC | PRN
  Administered 2022-04-19: .5 mL

## 2022-04-19 NOTE — Patient Instructions (Signed)
Standing Labs We placed an order today for your standing lab work.   Please have your standing labs drawn in December and every 3 months   If possible, please have your labs drawn 2 weeks prior to your appointment so that the provider can discuss your results at your appointment.  Please note that you may see your imaging and lab results in MyChart before we have reviewed them. We may be awaiting multiple results to interpret others before contacting you. Please allow our office up to 72 hours to thoroughly review all of the results before contacting the office for clarification of your results.  We currently have open lab daily: Monday through Thursday from 1:30 PM-4:30 PM and Friday from 1:30 PM- 4:00 PM If possible, please come for your lab work on Monday, Thursday or Friday afternoons, as you may experience shorter wait times.   Effective June 13, 2022 the new lab hours will change to: Monday through Thursday from 1:30 PM-5:00 PM and Friday from 8:30 AM-12:00 PM If possible, please come for your lab work on Monday and Thursday afternoons, as you may experience shorter wait times.  Please be advised, all patients with office appointments requiring lab work will take precedent over walk-in lab work.    The office is located at [address], [address], [place], [place] [address] No appointment is necessary.   Labs are drawn by [facility]. Please bring your co-pay at the time of your lab draw.  You may receive a bill from [facility] for your lab work.  Please note if you are on Hydroxychloroquine and and an order has been placed for a Hydroxychloroquine level, you will need to have it drawn 4 hours or more after your last dose.  If you wish to have your labs drawn at another location, please call the office 24 hours in advance to send orders.  If you have any questions regarding directions or hours of operation,  please call [phone].   As a reminder, please drink plenty of water prior  to coming for your lab work. Thanks!   If you have signs or symptoms of an infection or start antibiotics: First, call your PCP for workup of your infection. Hold your medication through the infection, until you complete your antibiotics, and until symptoms resolve if you take the following: Injectable medication (Actemra, Benlysta, Cimzia, Cosentyx, Enbrel, Humira, Kevzara, Orencia, Remicade, Simponi, Stelara, Taltz, Tremfya) Methotrexate Leflunomide (Arava) Mycophenolate (Cellcept) Xeljanz, Olumiant, or Rinvoq Vaccines You are taking a medication(s) that can suppress your immune system.  The following immunizations are recommended: Flu annually Covid-19  Td/Tdap (tetanus, diphtheria, pertussis) every 10 years Pneumonia (Prevnar 15 then Pneumovax 23 at least 1 year apart.  Alternatively, can take Prevnar 20 without needing additional dose) Shingrix: 2 doses from 4 weeks to 6 months apart  Please check with your PCP to make sure you are up to date.   

## 2022-04-20 NOTE — Progress Notes (Signed)
CBC and CMP WNL

## 2022-04-21 LAB — CBC WITH DIFFERENTIAL/PLATELET
Absolute Monocytes: 672 cells/uL (ref 200–950)
Basophils Absolute: 40 cells/uL (ref 0–200)
Basophils Relative: 0.5 %
Eosinophils Absolute: 450 cells/uL (ref 15–500)
Eosinophils Relative: 5.7 %
HCT: 49.6 % (ref 38.5–50.0)
Hemoglobin: 17.1 g/dL (ref 13.2–17.1)
Lymphs Abs: 1991 cells/uL (ref 850–3900)
MCH: 32.6 pg (ref 27.0–33.0)
MCHC: 34.5 g/dL (ref 32.0–36.0)
MCV: 94.5 fL (ref 80.0–100.0)
MPV: 10.8 fL (ref 7.5–12.5)
Monocytes Relative: 8.5 %
Neutro Abs: 4748 cells/uL (ref 1500–7800)
Neutrophils Relative %: 60.1 %
Platelets: 234 10*3/uL (ref 140–400)
RBC: 5.25 10*6/uL (ref 4.20–5.80)
RDW: 12.7 % (ref 11.0–15.0)
Total Lymphocyte: 25.2 %
WBC: 7.9 10*3/uL (ref 3.8–10.8)

## 2022-04-21 LAB — COMPLETE METABOLIC PANEL WITH GFR
AG Ratio: 2 (calc) (ref 1.0–2.5)
ALT: 43 U/L (ref 9–46)
AST: 30 U/L (ref 10–40)
Albumin: 4.3 g/dL (ref 3.6–5.1)
Alkaline phosphatase (APISO): 94 U/L (ref 36–130)
BUN: 17 mg/dL (ref 7–25)
CO2: 32 mmol/L (ref 20–32)
Calcium: 9.4 mg/dL (ref 8.6–10.3)
Chloride: 106 mmol/L (ref 98–110)
Creat: 1.17 mg/dL (ref 0.60–1.29)
Globulin: 2.2 g/dL (calc) (ref 1.9–3.7)
Glucose, Bld: 106 mg/dL — ABNORMAL HIGH (ref 65–99)
Potassium: 4.3 mmol/L (ref 3.5–5.3)
Sodium: 141 mmol/L (ref 135–146)
Total Bilirubin: 0.5 mg/dL (ref 0.2–1.2)
Total Protein: 6.5 g/dL (ref 6.1–8.1)
eGFR: 78 mL/min/{1.73_m2} (ref >=60)

## 2022-04-21 LAB — QUANTIFERON-TB GOLD PLUS
Mitogen-NIL: >10 IU/mL
NIL: 0.03 IU/mL
QuantiFERON-TB Gold Plus: POSITIVE — AB
TB1-NIL: 0.48 IU/mL
TB2-NIL: <0 IU/mL

## 2022-04-23 ENCOUNTER — Telehealth: Payer: Self-pay | Admitting: *Deleted

## 2022-04-23 DIAGNOSIS — R7612 Nonspecific reaction to cell mediated immunity measurement of gamma interferon antigen response without active tuberculosis: Secondary | ICD-10-CM

## 2022-04-23 NOTE — Telephone Encounter (Signed)
-----   Message from Gearldine Bienenstock, PA-C sent at 04/23/2022  4:15 PM EDT ----- TB gold is positive. Please notify the patient and Please refer to ID to rule out active TB vs. Latent TB and to determine the next steps in treatment.  He should hold humira until cleared by ID to restart therapy.

## 2022-04-23 NOTE — Progress Notes (Signed)
TB gold is positive. Please notify the patient and Please refer to ID to rule out active TB vs. Latent TB and to determine the next steps in treatment.  He should hold humira until cleared by ID to restart therapy.

## 2022-04-24 NOTE — Progress Notes (Unsigned)
Subjective:  Reason for Infectious Disease Consult: + QF gold  Requesting Physician: Sherron Ales, PA   Patient ID: Dennis Todd, male    DOB: 1975/09/12, 46 y.o.   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   He does have a cough with productive sputum over the last 4 months which she attributes to bronchitis in the context of him resuming smoking which she has done over the last 2 years.  He does not have weight loss drenching chills or other systemic symptoms though to suggest active pulmonary TB he is not traveled abroad.  He has not been incarcerated he has not been exposed anyone with TB that he knows of.     Past Medical History:  Diagnosis Date   Arthritis    Migraine     Past Surgical History:  Procedure Laterality Date   ANKLE SURGERY Left    back ablation     back injections     NASAL SINUS SURGERY      Family History  Problem Relation Age of Onset   Diabetes Father    Rheum arthritis Brother    Stroke Brother    Healthy Daughter       Social History   Socioeconomic History   Marital status: Married    Spouse name: Not on file   Number of children: 1   Years of education: Not on file   Highest education level: Not on file  Occupational History   Occupation: Bath remodeling  Tobacco Use   Smoking status: Former    Packs/day: 1.00    Years: 10.00    Total pack years: 10.00    Types: Cigarettes    Quit date: 08/13/2006    Years since quitting: 15.7    Passive exposure: Current   Smokeless tobacco: Former    Types: Snuff    Quit date: 08/13/2006  Vaping Use   Vaping Use: Never used  Substance and Sexual Activity   Alcohol use: No   Drug use: Never   Sexual activity: Yes  Other Topics Concern   Not on file  Social History Narrative   Not on file   Social Determinants of Health   Financial  Resource Strain: Not on file  Food Insecurity: Not on file  Transportation Needs: Not on file  Physical Activity: Not on file  Stress: Not on file  Social Connections: Not on file    No Known Allergies   Current Outpatient Medications:    Aspirin-Acetaminophen-Caffeine (GOODY HEADACHE PO), Take by mouth as needed., Disp: , Rfl:    HUMIRA PEN 40 MG/0.4ML PNKT, INJECT 40 MG UNDER THE SKIN EVERY 14 DAYS, Disp: 2 each, Rfl: 2   rosuvastatin (CRESTOR) 40 MG tablet, Take 1 tablet (40 mg total) by mouth daily., Disp: 90 tablet, Rfl: 3   tiZANidine HCl (ZANAFLEX PO), Take by mouth as needed., Disp: , Rfl:    traMADol (ULTRAM) 50 MG tablet, Take 50 mg by mouth daily as needed., Disp: , Rfl:    Review of Systems  Constitutional:  Negative for activity change, appetite change, chills, diaphoresis, fatigue, fever and unexpected weight change.  HENT:  Negative for congestion, rhinorrhea, sinus pressure, sneezing, sore throat and trouble swallowing.   Eyes:  Negative for photophobia and visual disturbance.  Respiratory:  Positive for cough. Negative for  chest tightness, shortness of breath, wheezing and stridor.   Cardiovascular:  Negative for chest pain, palpitations and leg swelling.  Gastrointestinal:  Negative for abdominal distention, abdominal pain, anal bleeding, blood in stool, constipation, diarrhea, nausea and vomiting.  Genitourinary:  Negative for difficulty urinating, dysuria, flank pain and hematuria.  Musculoskeletal:  Negative for arthralgias, back pain, gait problem, joint swelling and myalgias.  Skin:  Negative for color change, pallor, rash and wound.  Neurological:  Negative for dizziness, tremors, weakness and light-headedness.  Hematological:  Negative for adenopathy. Does not bruise/bleed easily.  Psychiatric/Behavioral:  Negative for agitation, behavioral problems, confusion, decreased concentration, dysphoric mood and sleep disturbance.        Objective:   Physical  Exam Constitutional:      Appearance: He is well-developed.  HENT:     Head: Normocephalic and atraumatic.  Eyes:     Conjunctiva/sclera: Conjunctivae normal.  Cardiovascular:     Rate and Rhythm: Normal rate and regular rhythm.     Heart sounds: No murmur heard.    No friction rub. No gallop.  Pulmonary:     Effort: Pulmonary effort is normal. No respiratory distress.     Breath sounds: No stridor. No wheezing or rhonchi.  Abdominal:     General: There is no distension.     Palpations: Abdomen is soft.  Musculoskeletal:        General: No tenderness. Normal range of motion.     Cervical back: Normal range of motion and neck supple.  Skin:    General: Skin is warm and dry.     Coloration: Skin is not pale.     Findings: No erythema or rash.  Neurological:     General: No focal deficit present.     Mental Status: He is alert and oriented to person, place, and time.  Psychiatric:        Mood and Affect: Mood normal.        Behavior: Behavior normal.        Thought Content: Thought content normal.        Judgment: Judgment normal.           Assessment & Plan:   QuantiFERON both gold test positive: Should presume latent TB  We will check chest x-ray two-view. I will check his HIV serous status today  If chest x-ray is negative we will start treatment for latent tuberculosis he would prefer a shorter regimen unfortunately rifapentine is on shortage still so we cannot use that we will have to use rifampin daily with isoniazid plus vitamin B6 for 3 months to give the shortest duration possible.  He endorses 3 drinks (liquor on weekend)  Screening for viral hepatitides we will screen for hepatitis B C and a  Spondyloarthritis: he is holding humira. Recommendations are for either to wait to complete rx for LTB vs get at least 2 months into therapy  I spent 61  minutes with the patient including than 50% of the time in face to face counseling of the patient the nature of  latent TB active TB, drugs were used to treat both,  along with review of medical records in preparation for the visit and during the visit and in coordination of nis care.

## 2022-04-25 ENCOUNTER — Encounter: Payer: Self-pay | Admitting: Infectious Disease

## 2022-04-25 ENCOUNTER — Ambulatory Visit
Admission: RE | Admit: 2022-04-25 | Discharge: 2022-04-25 | Disposition: A | Discharge status: Home / Self Care | Payer: BC Managed Care – PPO | Source: Ambulatory Visit | Attending: Infectious Disease | Admitting: Infectious Disease

## 2022-04-25 ENCOUNTER — Other Ambulatory Visit: Payer: Self-pay

## 2022-04-25 ENCOUNTER — Ambulatory Visit (INDEPENDENT_AMBULATORY_CARE_PROVIDER_SITE_OTHER): Payer: BC Managed Care – PPO | Admitting: Infectious Disease

## 2022-04-25 VITALS — BP 135/93 | HR 68 | Resp 16 | Ht 69.0 in | Wt 210.0 lb

## 2022-04-25 DIAGNOSIS — M47819 Spondylosis without myelopathy or radiculopathy, site unspecified: Secondary | ICD-10-CM | POA: Diagnosis not present

## 2022-04-25 DIAGNOSIS — R7612 Nonspecific reaction to cell mediated immunity measurement of gamma interferon antigen response without active tuberculosis: Secondary | ICD-10-CM

## 2022-04-25 DIAGNOSIS — R7611 Nonspecific reaction to tuberculin skin test without active tuberculosis: Secondary | ICD-10-CM | POA: Diagnosis not present

## 2022-04-25 DIAGNOSIS — Z1159 Encounter for screening for other viral diseases: Secondary | ICD-10-CM

## 2022-04-25 DIAGNOSIS — Z227 Latent tuberculosis: Secondary | ICD-10-CM | POA: Insufficient documentation

## 2022-04-25 DIAGNOSIS — Z5320 Procedure and treatment not carried out because of patient's decision for unspecified reasons: Secondary | ICD-10-CM

## 2022-04-25 HISTORY — DX: Nonspecific reaction to cell mediated immunity measurement of gamma interferon antigen response without active tuberculosis: R76.12

## 2022-04-25 HISTORY — DX: Latent tuberculosis: Z22.7

## 2022-04-26 ENCOUNTER — Telehealth: Payer: Self-pay | Admitting: Infectious Disease

## 2022-04-26 LAB — HIV ANTIBODY (ROUTINE TESTING W REFLEX): HIV 1&2 Ab, 4th Generation: NONREACTIVE

## 2022-04-26 LAB — HEPATITIS C ANTIBODY: Hepatitis C Ab: NONREACTIVE

## 2022-04-26 LAB — HEPATITIS B SURFACE ANTIGEN: Hepatitis B Surface Ag: NONREACTIVE

## 2022-04-26 LAB — HEPATITIS A ANTIBODY, TOTAL: Hepatitis A AB,Total: REACTIVE — AB

## 2022-04-26 LAB — HEPATITIS B SURFACE ANTIBODY, QUANTITATIVE: Hep B S AB Quant (Post): >1000 m[IU]/mL (ref >=10)

## 2022-04-26 MED ORDER — ISONIAZID 300 MG PO TABS
300.0000 mg | ORAL_TABLET | Freq: Every day | ORAL | 2 refills | Status: AC
  Filled 2022-04-26: qty 30, 30d supply, fill #0
  Filled 2022-05-27: qty 30, 30d supply, fill #1
  Filled 2022-06-27: qty 30, 30d supply, fill #2

## 2022-04-26 MED ORDER — VITAMIN B-6 50 MG PO TABS
50.0000 mg | ORAL_TABLET | Freq: Every day | ORAL | 2 refills | Status: AC
  Filled 2022-04-26: qty 30, 30d supply, fill #0
  Filled 2022-05-27 – 2022-06-27 (×3): qty 30, 30d supply, fill #1

## 2022-04-26 MED ORDER — RIFAMPIN 300 MG PO CAPS
600.0000 mg | ORAL_CAPSULE | Freq: Every day | ORAL | 2 refills | Status: AC
  Filled 2022-04-26: qty 60, 30d supply, fill #0
  Filled 2022-05-27: qty 60, 30d supply, fill #1
  Filled 2022-06-27: qty 60, 30d supply, fill #2

## 2022-04-26 NOTE — Telephone Encounter (Signed)
Patient CXR clear, HIV negative  He can start INH, RIF and vitamin B6 all daily  All sent to [facility]  Should be 300 mg INH daily  two 300mg  Rifampin = 600mg  daily and 50mg  of vitamin b6 daily  If he has trouble with nausea we can rx zofran  Epic claims that INH can raise tylenol levels so I dc his goody's powder--though pharmacy can go into how sig an interaction it is

## 2022-04-27 ENCOUNTER — Other Ambulatory Visit (HOSPITAL_COMMUNITY): Payer: Self-pay

## 2022-04-27 NOTE — Telephone Encounter (Signed)
I called the patient to clarify the timeline recommended by Dr. Daiva Eves.  The plan is for him to hold Humira for 2 months while concurrently being treated for latent TB.  Prescription for rifampin and INH were sent to the pharmacy yesterday.   The patient is apprehensive to hold Humira for 2 months.  He is concerned about flaring.  He continues to have persistent pain and stiffness despite not yet having a gap in therapy.  He has been taking tramadol 50 mg 1 to 2 tablets daily as needed for pain relief.  Discussed the importance of avoiding Tylenol and NSAID use while on rifampin.  He will be having close lab monitoring.  Discussed that he may need to increase his dose of tramadol to help better manage his pain.  He plans on reaching out to Dr. Laurian Brim who is the prescribing provider.  He was advised to notify us if he develops signs or symptoms of a flare.  We may need to use prednisone in the future if he flares.  I have reached out to Dr. Daiva Eves to clarify if we can use prednisone if needed.

## 2022-04-30 ENCOUNTER — Other Ambulatory Visit (HOSPITAL_COMMUNITY): Payer: Self-pay

## 2022-05-01 ENCOUNTER — Other Ambulatory Visit (HOSPITAL_COMMUNITY): Payer: Self-pay

## 2022-05-02 DIAGNOSIS — G894 Chronic pain syndrome: Secondary | ICD-10-CM | POA: Diagnosis not present

## 2022-05-02 DIAGNOSIS — M7918 Myalgia, other site: Secondary | ICD-10-CM | POA: Diagnosis not present

## 2022-05-02 DIAGNOSIS — M542 Cervicalgia: Secondary | ICD-10-CM | POA: Diagnosis not present

## 2022-05-02 DIAGNOSIS — Z79891 Long term (current) use of opiate analgesic: Secondary | ICD-10-CM | POA: Diagnosis not present

## 2022-05-08 ENCOUNTER — Other Ambulatory Visit: Payer: Self-pay | Admitting: Physician Assistant

## 2022-05-16 DIAGNOSIS — Z227 Latent tuberculosis: Secondary | ICD-10-CM | POA: Diagnosis not present

## 2022-05-16 DIAGNOSIS — D84821 Immunodeficiency due to drugs: Secondary | ICD-10-CM | POA: Diagnosis not present

## 2022-05-16 DIAGNOSIS — M545 Low back pain, unspecified: Secondary | ICD-10-CM | POA: Diagnosis not present

## 2022-05-16 DIAGNOSIS — Z79899 Other long term (current) drug therapy: Secondary | ICD-10-CM | POA: Diagnosis not present

## 2022-05-16 DIAGNOSIS — M0579 Rheumatoid arthritis with rheumatoid factor of multiple sites without organ or systems involvement: Secondary | ICD-10-CM | POA: Diagnosis not present

## 2022-05-16 DIAGNOSIS — R7612 Nonspecific reaction to cell mediated immunity measurement of gamma interferon antigen response without active tuberculosis: Secondary | ICD-10-CM | POA: Diagnosis not present

## 2022-05-22 ENCOUNTER — Encounter: Payer: Self-pay | Admitting: Nurse Practitioner

## 2022-05-22 ENCOUNTER — Ambulatory Visit: Payer: BC Managed Care – PPO | Attending: Cardiovascular Disease | Admitting: Nurse Practitioner

## 2022-05-22 VITALS — BP 124/78 | HR 56 | Ht 69.0 in | Wt 213.0 lb

## 2022-05-22 DIAGNOSIS — E785 Hyperlipidemia, unspecified: Secondary | ICD-10-CM

## 2022-05-22 DIAGNOSIS — M459 Ankylosing spondylitis of unspecified sites in spine: Secondary | ICD-10-CM | POA: Diagnosis not present

## 2022-05-22 DIAGNOSIS — Z227 Latent tuberculosis: Secondary | ICD-10-CM

## 2022-05-22 DIAGNOSIS — I2510 Atherosclerotic heart disease of native coronary artery without angina pectoris: Secondary | ICD-10-CM | POA: Diagnosis not present

## 2022-05-22 DIAGNOSIS — Z720 Tobacco use: Secondary | ICD-10-CM

## 2022-05-22 NOTE — Progress Notes (Signed)
Office Visit    Patient Name: Dennis Todd Date of Encounter: 05/22/2022  Primary Care Provider:  Medicine, [facility] Cardiologist:  Evalina Field, MD  Chief Complaint    46 year old male with a history of CAD, RBBB, hyperlipidemia, ankylosing spondylitis, latent TB, migraines, and tobacco use who presents for follow-up related to CAD and hyperlipidemia.  Past Medical History    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/25/2022   Past Surgical History:  Procedure Laterality Date   ANKLE SURGERY Left    back ablation     back injections     NASAL SINUS SURGERY      Allergies  No Known Allergies  History of Present Illness    46 year old male with the above past medical history including CAD, RBBB, hyperlipidemia, ankylosing spondylitis, latent TB, migraines, and tobacco use.  He was initially evaluated by Dr. Audie Box in June 2023 in the setting of chest pain.  Coronary CT angiogram revealed an occluded nondominant RCA, as well as nonobstructive disease in the left system, CAC of 283 (98th percentile).  He was started on aspirin.  Echocardiogram showed an EF of 60 to 65%, normal LV function, no RWMA, normal RV systolic function, no significant valvular abnormalities.  He was last seen in the office on 03/20/2022 and reported complete resolution of symptoms of chest pain and shortness of breath.  He was started on Crestor 40 mg daily.  He presents today for follow-up.  Since his last visit he has been stable overall from a cardiac standpoint.  He continues to note intermittent dyspnea on exertion, occasional chest discomfort at rest.  Occasional elevated heart rate.  He denies any significant palpitations, dizziness, presyncope, syncope.  He is following with ID for treatment of latent TB.  As a result, he is off all of his arthritis medications while he  undergoes treatment.  He continues to smoke.  Has not been taking aspirin though he does report adherence to his Crestor.  Other than his ongoing intermittent chest discomfort and shortness of breath, he denies any additional concerns today.  Home Medications    Current Outpatient Medications  Medication Sig Dispense Refill   HUMIRA PEN 40 MG/0.4ML PNKT INJECT 40 MG UNDER THE SKIN EVERY 14 DAYS 2 each 2   isoniazid (NYDRAZID) 300 MG tablet Take 1 tablet (300 mg total) by mouth daily. 30 tablet 2   pyridOXINE (VITAMIN B6) 50 MG tablet Take 1 tablet (50 mg total) by mouth daily. 30 tablet 2   rifampin (RIFADIN) 300 MG capsule Take 2 capsules (600 mg total) by mouth daily. 60 capsule 2   rosuvastatin (CRESTOR) 40 MG tablet Take 1 tablet (40 mg total) by mouth daily. 90 tablet 3   tiZANidine HCl (ZANAFLEX PO) Take by mouth as needed.     traMADol (ULTRAM) 50 MG tablet Take 50 mg by mouth daily as needed.     No current facility-administered medications for this visit.     Review of Systems    He denies palpitations, pnd, orthopnea, n, v, dizziness, syncope, edema, weight gain, or early satiety. All other systems reviewed and are otherwise negative except as noted above.   Physical Exam    VS:  BP 124/78 (BP Location: Left Arm, Patient Position: Sitting, Cuff Size: Normal)   Pulse (!) 56   Ht 5\' 9"  (1.753 m)  Wt 213 lb (96.6 kg)   BMI 31.45 kg/m  GEN: Well nourished, well developed, in no acute distress. HEENT: normal. Neck: Supple, no JVD, carotid bruits, or masses. Cardiac: RRR, no murmurs, rubs, or gallops. No clubbing, cyanosis, edema.  Radials/DP/PT 2+ and equal bilaterally.  Respiratory:  Respirations regular and unlabored, clear to auscultation bilaterally. GI: Soft, nontender, nondistended, BS + x 4. MS: no deformity or atrophy. Skin: warm and dry, no rash. Neuro:  Strength and sensation are intact. Psych: Normal affect.  Accessory Clinical Findings    ECG personally  reviewed by me today -no EKG in office today.   Lab Results  Component Value Date   WBC 7.9 04/19/2022   HGB 17.1 04/19/2022   HCT 49.6 04/19/2022   MCV 94.5 04/19/2022   PLT 234 04/19/2022   Lab Results  Component Value Date   CREATININE 1.17 04/19/2022   BUN 17 04/19/2022   NA 141 04/19/2022   K 4.3 04/19/2022   CL 106 04/19/2022   CO2 32 04/19/2022   Lab Results  Component Value Date   ALT 43 04/19/2022   AST 30 04/19/2022   ALKPHOS 96 04/02/2017   BILITOT 0.5 04/19/2022   Lab Results  Component Value Date   CHOL 237 (H) 03/02/2022   HDL 29 (L) 03/02/2022   LDLCALC 173 (H) 03/02/2022   TRIG 187 (H) 03/02/2022   CHOLHDL 8.2 (H) 03/02/2022    No results found for: "HGBA1C"  Assessment & Plan    1. CAD/dyspnea on exertion/chest pain:  Coronary CT angiogram revealed an occluded nondominant RCA, as well as nonobstructive disease in the left system, CAC of 283 (98th percentile).  He notes a few episodes of chest discomfort at rest, relieved with palpation.  He also notes mild dyspnea on exertion, occasional elevated heart rate with activity.  Discussed possible low-dose long-acting nitrate vs prn sublingual nitroglycerin.  Patient declines any additional medication at this time. Symptoms are overall atypical.  He does have a longstanding history of tobacco use.  He quit for a period of time but has been smoking for the past 2 years.  Given overall reassuring cardiac work-up, if his dyspnea on exertion persists, could consider pulmonology referral.  He has not been taking aspirin.  I advised him to start aspirin 81 mg daily.  Continue Crestor.  2. Hyperlipidemia: Recently started on Crestor. Will repeat fasting lipids, LFTs.  Continue aspirin, Crestor.  3. Ankylosing spondylitis: Currently off all his medications.  Follows with rheumatology.  4. Latent TB: Currently undergoing treatment.  Following with ID.   5. Tobacco use: He continues to smoke.  Full cessation  advised.  6. Disposition: Follow-up in 3-4 months.      Joylene Grapes, NP 05/22/2022, 8:36 AM

## 2022-05-22 NOTE — Patient Instructions (Signed)
Medication Instructions:  Your physician recommends that you continue on your current medications as directed. Please refer to the Current Medication list given to you today.   *If you need a refill on your cardiac medications before your next appointment, please call your pharmacy*   Lab Work: Your physician recommends that you complete lab work today Lipids & LFTs  If you have labs (blood work) drawn today and your tests are completely normal, you will receive your results only by: [place] (if you have MyChart) OR A paper copy in the mail If you have any lab test that is abnormal or we need to change your treatment, we will call you to review the results.   Testing/Procedures: NONE ordered at this time of appointment     Follow-Up: At [facility], you and your health needs are our priority.  As part of our continuing mission to provide you with exceptional heart care, we have created designated Provider Care Teams.  These Care Teams include your primary Cardiologist (physician) and Advanced Practice Providers (APPs -  Physician Assistants and Nurse Practitioners) who all work together to provide you with the care you need, when you need it.  We recommend signing up for the patient portal called "MyChart".  Sign up information is provided on this After Visit Summary.  MyChart is used to connect with patients for Virtual Visits (Telemedicine).  Patients are able to view lab/test results, encounter notes, upcoming appointments, etc.  Non-urgent messages can be sent to your provider as well.   To learn more about what you can do with MyChart, go to [email or web address].    Your next appointment:   3-4 month(s)  The format for your next appointment:   In Person  Provider:   Evalina Field, MD     Other Instructions   Important Information About Sugar

## 2022-05-24 DIAGNOSIS — Z227 Latent tuberculosis: Secondary | ICD-10-CM | POA: Diagnosis not present

## 2022-05-24 DIAGNOSIS — E785 Hyperlipidemia, unspecified: Secondary | ICD-10-CM | POA: Diagnosis not present

## 2022-05-24 DIAGNOSIS — M459 Ankylosing spondylitis of unspecified sites in spine: Secondary | ICD-10-CM | POA: Diagnosis not present

## 2022-05-24 DIAGNOSIS — I2510 Atherosclerotic heart disease of native coronary artery without angina pectoris: Secondary | ICD-10-CM | POA: Diagnosis not present

## 2022-05-24 LAB — LIPID PANEL
Chol/HDL Ratio: 3.8 ratio (ref 0.0–5.0)
Cholesterol, Total: 147 mg/dL (ref 100–199)
HDL: 39 mg/dL — ABNORMAL LOW (ref >39)
LDL Chol Calc (NIH): 93 mg/dL (ref 0–99)
Triglycerides: 78 mg/dL (ref 0–149)
VLDL Cholesterol Cal: 15 mg/dL (ref 5–40)

## 2022-05-24 LAB — HEPATIC FUNCTION PANEL
ALT: 49 IU/L — ABNORMAL HIGH (ref 0–44)
AST: 32 IU/L (ref 0–40)
Albumin: 4.5 g/dL (ref 4.1–5.1)
Alkaline Phosphatase: 87 IU/L (ref 44–121)
Bilirubin Total: 0.2 mg/dL (ref 0.0–1.2)
Bilirubin, Direct: <0.1 mg/dL (ref 0.00–0.40)
Total Protein: 6.6 g/dL (ref 6.0–8.5)

## 2022-05-25 ENCOUNTER — Other Ambulatory Visit: Payer: Self-pay

## 2022-05-25 ENCOUNTER — Telehealth: Payer: Self-pay

## 2022-05-25 DIAGNOSIS — E7800 Pure hypercholesterolemia, unspecified: Secondary | ICD-10-CM

## 2022-05-25 DIAGNOSIS — Z79899 Other long term (current) drug therapy: Secondary | ICD-10-CM

## 2022-05-25 MED ORDER — EZETIMIBE 10 MG PO TABS: 10.0000 mg | ORAL_TABLET | Freq: Every day | ORAL | 3 refills | Status: DC

## 2022-05-25 NOTE — Telephone Encounter (Signed)
Lmom to discuss lab results. Waiting on a return call.  

## 2022-05-25 NOTE — Telephone Encounter (Signed)
Spoke with pt. Pt returned call to discuss results. Pt will start Zetia 10 mg as directed and repeat labs in 6-8 weeks. Medication sent and lab orders placed.  

## 2022-05-29 ENCOUNTER — Other Ambulatory Visit: Payer: Self-pay

## 2022-05-29 ENCOUNTER — Ambulatory Visit (INDEPENDENT_AMBULATORY_CARE_PROVIDER_SITE_OTHER): Payer: BC Managed Care – PPO | Admitting: Infectious Disease

## 2022-05-29 ENCOUNTER — Encounter: Payer: Self-pay | Admitting: Infectious Disease

## 2022-05-29 VITALS — BP 120/81 | HR 68 | Temp 97.7°F | Ht 69.0 in | Wt 213.0 lb

## 2022-05-29 DIAGNOSIS — R7612 Nonspecific reaction to cell mediated immunity measurement of gamma interferon antigen response without active tuberculosis: Secondary | ICD-10-CM

## 2022-05-29 DIAGNOSIS — M549 Dorsalgia, unspecified: Secondary | ICD-10-CM

## 2022-05-29 DIAGNOSIS — M47819 Spondylosis without myelopathy or radiculopathy, site unspecified: Secondary | ICD-10-CM | POA: Diagnosis not present

## 2022-05-29 DIAGNOSIS — Z789 Other specified health status: Secondary | ICD-10-CM

## 2022-05-29 DIAGNOSIS — R110 Nausea: Secondary | ICD-10-CM

## 2022-05-29 DIAGNOSIS — F1090 Alcohol use, unspecified, uncomplicated: Secondary | ICD-10-CM

## 2022-05-29 DIAGNOSIS — Z227 Latent tuberculosis: Secondary | ICD-10-CM | POA: Diagnosis not present

## 2022-05-29 NOTE — Progress Notes (Signed)
Subjective:  Chief complaint follow-up for latent TB on medications with some nausea  Patient ID: Dennis Todd, male    DOB: Apr 23, 1976, 46 y.o.   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   He didhave a cough with productive sputum over the last 4 months which she attributes to bronchitis in the context of him resuming smoking which she has done over the last 2 years.  He doid not have weight loss drenching chills or other systemic symptoms though to suggest active pulmonary TB he is not traveled abroad.  He has not been incarcerated he has not been exposed anyone with TB that he knows of.  Initiate treatment with isoniazid and rifampin which she has been able to tolerate though he says the first week or so of taking it was kind of rough on him.  He also noticed that when he drank some alcohol this weekend he felt very sick.  I counseled him about the risks of hepatotoxicity from the TB medications and from other medications including alcohol.  On the whole though he seems to be doing fairly well he is anxious to get back on treatment for his ankylosing spondylosis.       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/25/2022    Past Surgical History:  Procedure Laterality Date   ANKLE SURGERY Left    back ablation     back injections     NASAL SINUS SURGERY      Family History  Problem Relation Age of Onset   Diabetes Father    Rheum arthritis Brother    Stroke Brother    Healthy Daughter       Social History   Socioeconomic History   Marital status: Married    Spouse name: Not on file   Number of children: 1   Years of education: Not on file   Highest education level: Not on file  Occupational History   Occupation: Bath  remodeling  Tobacco Use   Smoking status: Former    Packs/day: 1.00    Years: 10.00    Total pack years: 10.00    Types: Cigarettes    Quit date: 08/13/2006    Years since quitting: 15.8    Passive exposure: Current   Smokeless tobacco: Former    Types: Snuff    Quit date: 08/13/2006  Vaping Use   Vaping Use: Never used  Substance and Sexual Activity   Alcohol use: No   Drug use: Never   Sexual activity: Yes  Other Topics Concern   Not on file  Social History Narrative   Not on file   Social Determinants of Health   Financial Resource Strain: Not on file  Food Insecurity: Not on file  Transportation Needs: Not on file  Physical Activity: Not on file  Stress: Not on file  Social Connections: Not on file    No Known Allergies   Current Outpatient Medications:    ezetimibe (ZETIA) 10 MG tablet, Take 1 tablet (10 mg total) by mouth daily., Disp: 90 tablet, Rfl: 3   HUMIRA PEN 40 MG/0.4ML PNKT, INJECT 40 MG UNDER THE SKIN EVERY 14 DAYS, Disp: 2 each, Rfl: 2   isoniazid (NYDRAZID)  300 MG tablet, Take 1 tablet (300 mg total) by mouth daily., Disp: 30 tablet, Rfl: 2   pyridOXINE (VITAMIN B6) 50 MG tablet, Take 1 tablet (50 mg total) by mouth daily., Disp: 30 tablet, Rfl: 2   rifampin (RIFADIN) 300 MG capsule, Take 2 capsules (600 mg total) by mouth daily., Disp: 60 capsule, Rfl: 2   rosuvastatin (CRESTOR) 40 MG tablet, Take 1 tablet (40 mg total) by mouth daily., Disp: 90 tablet, Rfl: 3   tiZANidine HCl (ZANAFLEX PO), Take by mouth as needed., Disp: , Rfl:    traMADol (ULTRAM) 50 MG tablet, Take 50 mg by mouth daily as needed., Disp: , Rfl:    Review of Systems  Constitutional:  Negative for activity change, appetite change, chills, diaphoresis, fatigue, fever and unexpected weight change.  HENT:  Negative for congestion, rhinorrhea, sinus pressure, sneezing, sore throat and trouble swallowing.   Eyes:  Negative for photophobia and visual disturbance.  Respiratory:  Negative  for cough, chest tightness, shortness of breath, wheezing and stridor.   Cardiovascular:  Negative for chest pain, palpitations and leg swelling.  Gastrointestinal:  Positive for nausea. Negative for abdominal distention, abdominal pain, anal bleeding, blood in stool, constipation, diarrhea and vomiting.  Genitourinary:  Negative for difficulty urinating, dysuria, flank pain and hematuria.  Musculoskeletal:  Negative for arthralgias, back pain, gait problem, joint swelling and myalgias.  Skin:  Negative for color change, pallor, rash and wound.  Neurological:  Negative for dizziness, tremors, weakness and light-headedness.  Hematological:  Negative for adenopathy. Does not bruise/bleed easily.  Psychiatric/Behavioral:  Negative for agitation, behavioral problems, confusion, decreased concentration, dysphoric mood and sleep disturbance.        Objective:   Physical Exam Constitutional:      Appearance: He is well-developed.  HENT:     Head: Normocephalic and atraumatic.  Eyes:     Conjunctiva/sclera: Conjunctivae normal.  Cardiovascular:     Rate and Rhythm: Normal rate and regular rhythm.  Pulmonary:     Effort: Pulmonary effort is normal. No respiratory distress.     Breath sounds: No wheezing.  Abdominal:     General: There is no distension.     Palpations: Abdomen is soft.  Musculoskeletal:        General: No tenderness. Normal range of motion.     Cervical back: Normal range of motion and neck supple.  Skin:    General: Skin is warm and dry.     Coloration: Skin is not pale.     Findings: No erythema or rash.  Neurological:     General: No focal deficit present.     Mental Status: He is alert and oriented to person, place, and time.  Psychiatric:        Mood and Affect: Mood normal.        Behavior: Behavior normal.        Thought Content: Thought content normal.        Judgment: Judgment normal.           Assessment & Plan:   Latent TB:  He is to continue  his isoniazid rifampin and pyridoxine to complete 3 months of total therapy. We will check a CMP today.   Spondyloarthritis: He is holding Humira recommendations are to wait until either treatment is completed which will be 3 months or at least 2 months into treatment.  Alcohol use  needs to minimize ingestion  given the risks of hepatotoxicity while taking INH and rifampin

## 2022-05-30 LAB — COMPLETE METABOLIC PANEL WITH GFR
AG Ratio: 2 (calc) (ref 1.0–2.5)
ALT: 45 U/L (ref 9–46)
AST: 25 U/L (ref 10–40)
Albumin: 4.3 g/dL (ref 3.6–5.1)
Alkaline phosphatase (APISO): 81 U/L (ref 36–130)
BUN: 15 mg/dL (ref 7–25)
CO2: 29 mmol/L (ref 20–32)
Calcium: 9.1 mg/dL (ref 8.6–10.3)
Chloride: 106 mmol/L (ref 98–110)
Creat: 1.04 mg/dL (ref 0.60–1.29)
Globulin: 2.1 g/dL (calc) (ref 1.9–3.7)
Glucose, Bld: 112 mg/dL — ABNORMAL HIGH (ref 65–99)
Potassium: 4 mmol/L (ref 3.5–5.3)
Sodium: 142 mmol/L (ref 135–146)
Total Bilirubin: 0.4 mg/dL (ref 0.2–1.2)
Total Protein: 6.4 g/dL (ref 6.1–8.1)
eGFR: 90 mL/min/{1.73_m2} (ref >=60)

## 2022-06-01 ENCOUNTER — Other Ambulatory Visit (HOSPITAL_COMMUNITY): Payer: Self-pay

## 2022-06-02 ENCOUNTER — Other Ambulatory Visit (HOSPITAL_COMMUNITY): Payer: Self-pay

## 2022-06-05 ENCOUNTER — Other Ambulatory Visit (HOSPITAL_COMMUNITY): Payer: Self-pay

## 2022-06-05 DIAGNOSIS — M542 Cervicalgia: Secondary | ICD-10-CM | POA: Diagnosis not present

## 2022-06-07 DIAGNOSIS — M542 Cervicalgia: Secondary | ICD-10-CM | POA: Diagnosis not present

## 2022-06-12 DIAGNOSIS — M542 Cervicalgia: Secondary | ICD-10-CM | POA: Diagnosis not present

## 2022-06-13 DIAGNOSIS — M542 Cervicalgia: Secondary | ICD-10-CM | POA: Diagnosis not present

## 2022-06-19 DIAGNOSIS — M542 Cervicalgia: Secondary | ICD-10-CM | POA: Diagnosis not present

## 2022-06-20 DIAGNOSIS — M542 Cervicalgia: Secondary | ICD-10-CM | POA: Diagnosis not present

## 2022-06-26 DIAGNOSIS — M542 Cervicalgia: Secondary | ICD-10-CM | POA: Diagnosis not present

## 2022-06-27 ENCOUNTER — Other Ambulatory Visit (HOSPITAL_COMMUNITY): Payer: Self-pay

## 2022-06-27 DIAGNOSIS — M542 Cervicalgia: Secondary | ICD-10-CM | POA: Diagnosis not present

## 2022-06-28 ENCOUNTER — Other Ambulatory Visit (HOSPITAL_COMMUNITY): Payer: Self-pay

## 2022-07-12 NOTE — Progress Notes (Unsigned)
Office Visit Note  Patient: Dennis Todd             Date of Birth: 07/05/1976           MRN: [id number]             PCP: Medicine, [facility] Referring: Medicine, [place]* Visit Date: 07/25/2022 Occupation: _0 @  Subjective:  Discuss restarting humira   History of Present Illness: Dennis Todd is a 46 y.o. 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  Patient is currently holding Humira while undergoing treatment for latent TB.  Patient had a positive TB Gold on 04/19/2022.  She was evaluated by Dr. Tommy Medal who recommended holding Humira for 3 months while undergoing treatment for latent TB.  According to the patient he has 2 more weeks of rifampin/isoniazid which she plans on completing prior to resuming Humira.  Patient reports that he has been experiencing increased pain and stiffness in both hands as well as the right great toe.  He has noticed some swelling in his right thumb intermittently.  He continues to have ongoing pain in his neck.  He completed physical therapy which was helpful but has not been performing home exercises recently.  He has been using a massage chair in the evening which has been helpful for his lower back discomfort.  He denies any increased SI joint pain currently.  He has been taking tramadol as needed for pain relief.  He takes [facility] powder very sparingly for pain relief.  He denies any new medical conditions.  He denies any other recent or recurrent infections.    Activities of Daily Living:  Patient reports morning stiffness for 1 hour.   Patient Reports nocturnal pain.  Difficulty dressing/grooming: Denies Difficulty climbing stairs: Reports Difficulty getting out of chair: Reports Difficulty using hands for taps, buttons, cutlery, and/or writing: Reports  Review of Systems  Constitutional:  Positive for fatigue.  HENT:  Negative for mouth sores and mouth dryness.   Eyes:  Negative for dryness.  Respiratory:  Positive for  shortness of breath.   Cardiovascular:  Positive for chest pain and palpitations.  Gastrointestinal:  Negative for blood in stool, constipation and diarrhea.  Endocrine: Negative for increased urination.  Genitourinary:  Negative for involuntary urination.  Musculoskeletal:  Positive for joint pain, joint pain, myalgias, morning stiffness, muscle tenderness and myalgias. Negative for gait problem, joint swelling and muscle weakness.  Skin:  Negative for color change, rash and sensitivity to sunlight.  Allergic/Immunologic: Negative for susceptible to infections.  Neurological:  Negative for dizziness and headaches.  Hematological:  Negative for swollen glands.  Psychiatric/Behavioral:  Negative for depressed mood and sleep disturbance. The patient is not nervous/anxious.     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    Family History  Problem Relation Age of Onset   Diabetes Father    Rheum arthritis Brother    Stroke Brother  Healthy Daughter    Past Surgical History:  Procedure Laterality Date   ANKLE SURGERY Left    back ablation     back injections     NASAL SINUS SURGERY     Social History   Social History Narrative   Not on file   Immunization History  Administered Date(s) Administered   Influenza,inj,Quad PF,6+ Mos 07/21/2015   Influenza-Unspecified 09/27/2016     Objective: Vital Signs: BP 126/73 (BP Location: Left Arm, Patient  Position: Sitting, Cuff Size: Normal)   Pulse 62   Resp 14   Ht _0  (1.753 m)   Wt 212 lb 9.6 oz (96.4 kg)   BMI 31.40 kg/m    Physical Exam Vitals and nursing note reviewed.  Constitutional:      Appearance: He is well-developed.  HENT:     Head: Normocephalic and atraumatic.  Eyes:     Conjunctiva/sclera: Conjunctivae normal.     Pupils: Pupils are equal, round, and reactive to light.  Cardiovascular:     Rate and Rhythm: Normal rate and regular rhythm.     Heart sounds: Normal heart sounds.  Pulmonary:     Effort: Pulmonary effort is normal.     Breath sounds: Normal breath sounds.  Abdominal:     General: Bowel sounds are normal.     Palpations: Abdomen is soft.  Musculoskeletal:     Cervical back: Normal range of motion and neck supple.  Skin:    General: Skin is warm and dry.     Capillary Refill: Capillary refill takes less than 2 seconds.  Neurological:     Mental Status: He is alert and oriented to person, place, and time.  Psychiatric:        Behavior: Behavior normal.      Musculoskeletal Exam: C-spine has painful and slightly limited range of motion with lateral rotation.  Some midline spinal tenderness in the thoracic and lumbar region as well as over both SI joints.  Shoulder joints have good range of motion with no discomfort.  Elbow joints have good range of motion with no tenderness or inflammation.  Both wrist joints have good range of motion with no tenderness or synovitis.  Tenderness over the right first MCP and PIP joint and left third and fourth MCP and PIP joints.  Hip joints have good range of motion with no groin pain.  Knee joints have good range of motion with no warmth or effusion.  Ankle joints have good range of motion with no tenderness or synovitis.  Tenderness over the right great toe.  CDAI Exam: CDAI Score: -- Patient Global: --; Provider Global: -- Swollen: 0 ; Tender: 10  Joint Exam 07/25/2022      Right  Left  MCP 1   Tender      MCP 3      Tender  MCP 4      Tender  IP   Tender     PIP 3      Tender  PIP 4      Tender  Cervical Spine   Tender     Sacroiliac   Tender   Tender  MTP 1   Tender        Investigation: No additional findings.  Imaging: No results found.  Recent Labs: Lab Results  Component Value Date   WBC 7.9 04/19/2022   HGB 17.1 04/19/2022   PLT 234 04/19/2022   NA 142 05/29/2022   K 4.0 05/29/2022   CL  106 05/29/2022   CO2 29 05/29/2022   GLUCOSE 112 (H) 05/29/2022   BUN 15 05/29/2022   CREATININE 1.04 05/29/2022   BILITOT 0.4 07/20/2022   ALKPHOS 98 07/20/2022   AST 51 (H) 07/20/2022   ALT 72 (H) 07/20/2022   PROT 6.8 07/20/2022   ALBUMIN 4.6 07/20/2022   CALCIUM 9.1 05/29/2022   GFRAA 98 09/09/2020   QFTBGOLDPLUS POSITIVE (A) 04/19/2022    Speciality Comments:   FU every 3 months  Procedures:  No procedures performed Allergies: Patient has no known allergies.        Assessment / Plan:     Visit Diagnoses: Spondyloarthritis: Patient presents today experiencing increased arthralgias and joint stiffness.  His pain has been most severe in both hands and intermittently in the right great toe.  On examination he has painful range of motion of the C-spine with some tenderness midline along the thoracic and lumbar spine and tenderness over both SI joints.  No evidence of achilles tendonitis or plantar fasciitis.  No signs or symptoms of uveitis.  His TB gold was positive on 04/19/22 at which time he was referred to ID and was started on Rifampin, isoniazid, and pyridoxine x3 months. He has been holding Humira while undergoing treatment for latent TB.  According to the patient he has 2 more weeks of treatment and then will be resuming Humira as prescribed. He plans on continuing to take tramadol as needed for pain relief until then.  He declined a SI joint cortisone injections at this time.  Declined a prednisone taper.   He will notify us if his symptoms do not improve after  restarting Humira.  He will follow-up in the office in 3 months or sooner if needed.  HLA B27 (HLA B27 positive)  High risk medication use - Currently holding-Humira 40 mg sq injections every 14 days. TB Gold was positive on 04/19/2022.  Referred to ID and established care with Dr. Drucilla Schmidt.  Patient is currently undergoing treatment for latent TB and was advised to hold Humira during the 29-monthtreatment. CBC drawn on 04/19/2022.  CMP drawn on 05/29/2022.  Hepatic function panel and lipid panel updated on 07/20/2022.  Results were reviewed today in the office.  CBC and BMP were updated today prior to resuming Humira.  His next lab work will be due in March and every 3 months to monitor for drug toxicity. He will require yearly chest x-ray going forward for TB screening.    - Plan: CBC with Differential/Platelet, BASIC METABOLIC PANEL WITH GFR Discussed the importance of holding humira if he develops any other signs or symptoms of an infection and to resume once the infection has completely cleared.  TB lung, latent: Under the care of Dr. VTommy Medal  Reviewed office visit note from 05/29/2022.  He has been taking isoniazid, rifampin, and pyridoxine as prescribed for latent TB and will be completing the 319-monthourse of treatment in 2 weeks.  He plans on resuming humira in 2 weeks.   Discussed that he will require yearly chest x-rays for screening going forward.  Trapezius muscle spasm: He has painful and limited lateral rotation of the C-spine.  Some trapezius muscle tension and tenderness bilaterally.  He completed physical therapy which was helpful.  He was strongly encouraged to continue home exercises.  Chronic right shoulder pain - Resolved.  Chronic SI joint pain: He is experiencing some tenderness palpation over both SI joints currently.  He declined SI joint cortisone injections today.  DDD (  degenerative disc disease), lumbar: He is experiencing some midline spinal tenderness in the lumbar  region.  He purchased a massage chair and has been using it nightly which has been helpful.  Other medical conditions are listed as follows:   Personal history of chronic sinusitis  Vitamin D deficiency  History of migraine  Tinea versicolor  Orders: Orders Placed This Encounter  Procedures   CBC with Differential/Platelet   BASIC METABOLIC PANEL WITH GFR   No orders of the defined types were placed in this encounter.    Follow-Up Instructions: Return in about 3 months (around 10/24/2022) for Spondylarthritis .   Ofilia Neas, PA-C  Note - This record has been created using Dragon software.  Chart creation errors have been sought, but may not always  have been located. Such creation errors do not reflect on  the standard of medical care.

## 2022-07-20 DIAGNOSIS — E7800 Pure hypercholesterolemia, unspecified: Secondary | ICD-10-CM | POA: Diagnosis not present

## 2022-07-20 DIAGNOSIS — Z79899 Other long term (current) drug therapy: Secondary | ICD-10-CM | POA: Diagnosis not present

## 2022-07-20 LAB — HEPATIC FUNCTION PANEL
ALT: 72 IU/L — ABNORMAL HIGH (ref 0–44)
AST: 51 IU/L — ABNORMAL HIGH (ref 0–40)
Albumin: 4.6 g/dL (ref 4.1–5.1)
Alkaline Phosphatase: 98 IU/L (ref 44–121)
Bilirubin Total: 0.4 mg/dL (ref 0.0–1.2)
Bilirubin, Direct: 0.15 mg/dL (ref 0.00–0.40)
Total Protein: 6.8 g/dL (ref 6.0–8.5)

## 2022-07-20 LAB — LIPID PANEL
Chol/HDL Ratio: 3.3 ratio (ref 0.0–5.0)
Cholesterol, Total: 118 mg/dL (ref 100–199)
HDL: 36 mg/dL — ABNORMAL LOW (ref >39)
LDL Chol Calc (NIH): 66 mg/dL (ref 0–99)
Triglycerides: 79 mg/dL (ref 0–149)
VLDL Cholesterol Cal: 16 mg/dL (ref 5–40)

## 2022-07-23 ENCOUNTER — Telehealth: Payer: Self-pay

## 2022-07-23 NOTE — Telephone Encounter (Signed)
Spoke with pt. Pt was notified of lab results. Pt will continue his current medication and will follow up as planned.

## 2022-07-25 ENCOUNTER — Encounter: Payer: Self-pay | Admitting: Physician Assistant

## 2022-07-25 ENCOUNTER — Ambulatory Visit: Payer: BC Managed Care – PPO | Attending: Physician Assistant | Admitting: Physician Assistant

## 2022-07-25 VITALS — BP 126/73 | HR 62 | Resp 14 | Ht 69.0 in | Wt 212.6 lb

## 2022-07-25 DIAGNOSIS — M62838 Other muscle spasm: Secondary | ICD-10-CM

## 2022-07-25 DIAGNOSIS — Z1589 Genetic susceptibility to other disease: Secondary | ICD-10-CM

## 2022-07-25 DIAGNOSIS — E559 Vitamin D deficiency, unspecified: Secondary | ICD-10-CM

## 2022-07-25 DIAGNOSIS — M533 Sacrococcygeal disorders, not elsewhere classified: Secondary | ICD-10-CM

## 2022-07-25 DIAGNOSIS — Z79899 Other long term (current) drug therapy: Secondary | ICD-10-CM

## 2022-07-25 DIAGNOSIS — M51369 Other intervertebral disc degeneration, lumbar region without mention of lumbar back pain or lower extremity pain: Secondary | ICD-10-CM

## 2022-07-25 DIAGNOSIS — Z8709 Personal history of other diseases of the respiratory system: Secondary | ICD-10-CM

## 2022-07-25 DIAGNOSIS — M25511 Pain in right shoulder: Secondary | ICD-10-CM

## 2022-07-25 DIAGNOSIS — B360 Pityriasis versicolor: Secondary | ICD-10-CM

## 2022-07-25 DIAGNOSIS — M5136 Other intervertebral disc degeneration, lumbar region: Secondary | ICD-10-CM

## 2022-07-25 DIAGNOSIS — Z227 Latent tuberculosis: Secondary | ICD-10-CM

## 2022-07-25 DIAGNOSIS — Z8669 Personal history of other diseases of the nervous system and sense organs: Secondary | ICD-10-CM

## 2022-07-25 DIAGNOSIS — G8929 Other chronic pain: Secondary | ICD-10-CM

## 2022-07-25 DIAGNOSIS — M47819 Spondylosis without myelopathy or radiculopathy, site unspecified: Secondary | ICD-10-CM

## 2022-07-25 NOTE — Patient Instructions (Signed)
Standing Labs We placed an order today for your standing lab work.   Please have your standing labs drawn in March and every 3 months  Please have your labs drawn 2 weeks prior to your appointment so that the provider can discuss your lab results at your appointment.  Please note that you may see your imaging and lab results in MyChart before we have reviewed them. We will contact you once all results are reviewed. Please allow our office up to 72 hours to thoroughly review all of the results before contacting the office for clarification of your results.  Lab hours are:   Monday through Thursday from 8:00 am -12:30 pm and 1:00 pm-5:00 pm and Friday from 8:00 am-12:00 pm.  Please be advised, all patients with office appointments requiring lab work will take precedent over walk-in lab work.   Labs are drawn by [facility]. Please bring your co-pay at the time of your lab draw.  You may receive a bill from [facility] for your lab work.  Please note if you are on Hydroxychloroquine and and an order has been placed for a Hydroxychloroquine level, you will need to have it drawn 4 hours or more after your last dose.  If you wish to have your labs drawn at another location, please call the office 24 hours in advance so we can fax the orders.  The office is located at [address], [address], [place], [place] [address] No appointment is necessary.    If you have any questions regarding directions or hours of operation,  please call [phone].   As a reminder, please drink plenty of water prior to coming for your lab work. Thanks!  

## 2022-07-26 LAB — CBC WITH DIFFERENTIAL/PLATELET
Absolute Monocytes: 738 cells/uL (ref 200–950)
Basophils Absolute: 57 cells/uL (ref 0–200)
Basophils Relative: 0.7 %
Eosinophils Absolute: 508 cells/uL — ABNORMAL HIGH (ref 15–500)
Eosinophils Relative: 6.2 %
HCT: 47.5 % (ref 38.5–50.0)
Hemoglobin: 16.8 g/dL (ref 13.2–17.1)
Lymphs Abs: 1870 cells/uL (ref 850–3900)
MCH: 32.5 pg (ref 27.0–33.0)
MCHC: 35.4 g/dL (ref 32.0–36.0)
MCV: 91.9 fL (ref 80.0–100.0)
MPV: 11 fL (ref 7.5–12.5)
Monocytes Relative: 9 %
Neutro Abs: 5027 cells/uL (ref 1500–7800)
Neutrophils Relative %: 61.3 %
Platelets: 240 10*3/uL (ref 140–400)
RBC: 5.17 10*6/uL (ref 4.20–5.80)
RDW: 12.7 % (ref 11.0–15.0)
Total Lymphocyte: 22.8 %
WBC: 8.2 10*3/uL (ref 3.8–10.8)

## 2022-07-26 LAB — BASIC METABOLIC PANEL WITH GFR
BUN: 16 mg/dL (ref 7–25)
CO2: 27 mmol/L (ref 20–32)
Calcium: 9.5 mg/dL (ref 8.6–10.3)
Chloride: 105 mmol/L (ref 98–110)
Creat: 1.03 mg/dL (ref 0.60–1.29)
Glucose, Bld: 101 mg/dL — ABNORMAL HIGH (ref 65–99)
Potassium: 4.1 mmol/L (ref 3.5–5.3)
Sodium: 142 mmol/L (ref 135–146)
eGFR: 91 mL/min/{1.73_m2} (ref >=60)

## 2022-07-26 NOTE — Progress Notes (Signed)
BMP WNL.  Absolute eosinophils are borderline elevated.  Rest of CBC WNL.

## 2022-08-09 DIAGNOSIS — J111 Influenza due to unidentified influenza virus with other respiratory manifestations: Secondary | ICD-10-CM | POA: Diagnosis not present

## 2022-08-17 ENCOUNTER — Telehealth: Payer: Self-pay | Admitting: Pharmacist

## 2022-08-17 NOTE — Telephone Encounter (Signed)
Submitted a Prior Authorization RENEWAL request to [facility] for HUMIRA via CoverMyMeds. Will update once we receive a response.  Key: [id number]  Patient currently holding Humira for 2 months while on TB treatment  Knox Saliva, PharmD, MPH, BCPS, CPP Clinical Pharmacist (Rheumatology and Pulmonology)

## 2022-08-21 ENCOUNTER — Other Ambulatory Visit (HOSPITAL_COMMUNITY): Payer: Self-pay

## 2022-08-21 NOTE — Telephone Encounter (Signed)
Received notification from [facility] regarding a prior authorization for [place]. Authorization has been APPROVED from 08/17/2021 to 08/15/2022.   Patient can fill through [place]: [phone]   Authorization # [id number]  Knox Saliva, PharmD, MPH, BCPS, CPP Clinical Pharmacist (Rheumatology and Pulmonology)

## 2022-08-31 DIAGNOSIS — M7918 Myalgia, other site: Secondary | ICD-10-CM | POA: Diagnosis not present

## 2022-08-31 DIAGNOSIS — M5459 Other low back pain: Secondary | ICD-10-CM | POA: Diagnosis not present

## 2022-08-31 DIAGNOSIS — M47816 Spondylosis without myelopathy or radiculopathy, lumbar region: Secondary | ICD-10-CM | POA: Diagnosis not present

## 2022-08-31 DIAGNOSIS — M542 Cervicalgia: Secondary | ICD-10-CM | POA: Diagnosis not present

## 2022-09-02 NOTE — Progress Notes (Unsigned)
Cardiology Office Note:   Date:  09/05/2022  NAME:  Dennis Todd    MRN: [id number] DOB:  05-Dec-1975   PCP:  Lauretta Grill, FNP  Cardiologist:  Reatha Harps, MD  Electrophysiologist:  None   Referring MD: Medicine, [facility]*   Chief Complaint  Patient presents with   Follow-up        History of Present Illness:   Dennis Todd is a 47 y.o. male with a hx of CAD who presents for follow-up.  He reports he is doing well.  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  He reports he can get short of breath and have some chest discomfort but symptoms resolved quickly.  They are infrequent.  His LDL cholesterol is at goal.  His family history is plagued with extensive coronary artery disease.  We discussed the mainstay of treatment for him as preventive.  We are on top of this.  Hopefully we can prevent him from having a myocardial infarction in life.  Blood pressure is well-controlled.  We discussed improving his diet.  He will work on this.  Denies any concerning symptoms today.  Problem List CAD -CAC score 283 (98th percentile) -occluded non-dominant RCA -25-49% LAD/LCX -calcified plaque volume 58 mm3; non-calcified plaque volume 372 mm2; total plaque 430 mm2 (Grade 2) 2. HLD -T chol 118, HDL 36, LDL 66, TG 79 3. 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   HUMIRA PEN 40 MG/0.4ML PNKT INJECT 40 MG UNDER THE SKIN EVERY 14 DAYS   rosuvastatin (CRESTOR) 40 MG tablet Take 1 tablet (40 mg total) by mouth daily.    tiZANidine HCl (ZANAFLEX PO) Take by mouth as needed.   traMADol (ULTRAM) 50 MG tablet Take 50 mg by mouth daily as needed.     Allergies:    Patient has no known allergies.   Social History: Social History   Socioeconomic History   Marital status: Married    Spouse name: Not on file   Number of children: 1   Years of education: Not on file   Highest education level: Not on file  Occupational History   Occupation: Bath remodeling  Tobacco Use   Smoking status: Former    Packs/day: 1.00    Years: 10.00    Total pack years: 10.00    Types: Cigarettes    Quit date: 08/13/2006    Years since quitting: 16.0    Passive exposure: Current   Smokeless tobacco: Former    Types: Snuff    Quit date: 08/13/2006  Vaping Use   Vaping Use: Never used  Substance and Sexual Activity   Alcohol use: No   Drug use: Never   Sexual activity: Yes  Other Topics Concern   Not on file  Social History Narrative   Not on file   Social Determinants of Health   Financial Resource Strain: Not on file  Food Insecurity: Not on file  Transportation Needs:  Not on file  Physical Activity: Not on file  Stress: Not on file  Social Connections: Not on file     Family History: The patient's family history includes Diabetes in his father; Healthy in his daughter; Rheum arthritis in his brother; Stroke in his brother.  ROS:   All other ROS reviewed and negative. Pertinent positives noted in the HPI.     EKGs/Labs/Other Studies Reviewed:   The following studies were personally reviewed by me today:   Recent Labs: 07/20/2022: ALT 72 07/25/2022: BUN 16; Creat 1.03; Hemoglobin 16.8; Platelets 240; Potassium 4.1; Sodium 142   Recent Lipid Panel    Component Value Date/Time   CHOL 118 07/20/2022 1012   TRIG 79 07/20/2022 1012   HDL 36 (L) 07/20/2022 1012   CHOLHDL 3.3 07/20/2022 1012   LDLCALC 66 07/20/2022 1012    Physical Exam:   VS:  BP 115/78   Pulse (!) 57   Ht 5\' 9"  (1.753 m)   Wt 213  lb (96.6 kg)   SpO2 99%   BMI 31.45 kg/m    Wt Readings from Last 3 Encounters:  09/05/22 213 lb (96.6 kg)  07/25/22 212 lb 9.6 oz (96.4 kg)  05/29/22 213 lb (96.6 kg)    General: Well nourished, well developed, in no acute distress Head: Atraumatic, normal size  Eyes: PEERLA, EOMI  Neck: Supple, no JVD Endocrine: No thryomegaly Cardiac: Normal S1, S2; RRR; no murmurs, rubs, or gallops Lungs: Clear to auscultation bilaterally, no wheezing, rhonchi or rales  Abd: Soft, nontender, no hepatomegaly  Ext: No edema, pulses 2+ Musculoskeletal: No deformities, BUE and BLE strength normal and equal Skin: Warm and dry, no rashes   Neuro: Alert and oriented to person, place, time, and situation, CNII-XII grossly intact, no focal deficits  Psych: Normal mood and affect   ASSESSMENT:   Dennis Todd is a 47 y.o. male who presents for the following: 1. Coronary artery disease involving native coronary artery of native heart without angina pectoris   2. Hyperlipidemia LDL goal <70     PLAN:   1. Coronary artery disease involving native coronary artery of native heart without angina pectoris 2. Hyperlipidemia LDL goal <70 -Elevated coronary calcium score.  Extensive noncalcified plaque volume by coronary CTA.  His nondominant RCA is subtotally occluded.  His left dominant system has no significant stenoses by CT FFR.  We discussed that the mainstay of treatment is preventative.  He endorses infrequent episodes of chest discomfort.  Will continue with aggressive medical therapy at this time.  Will continue aspirin 81 mg daily, Crestor 40 mg daily, Zetia 10 mg daily.  His LDL cholesterol is 66 which is at goal.  His echo was normal.  His cardiovascular examination is normal.  We discussed proper diet as well as regular exercise.  He will work on all of this.  He will see [place] back in 1 year.   Disposition: Return in about 1 year (around 09/06/2023).  Medication Adjustments/Labs and Tests  Ordered: Current medicines are reviewed at length with the patient today.  Concerns regarding medicines are outlined above.  No orders of the defined types were placed in this encounter.  No orders of the defined types were placed in this encounter.   Patient Instructions  Medication Instructions:  The current medical regimen is effective;  continue present plan and medications.  *If you need a refill on your cardiac medications before your next appointment, please call your pharmacy*   Follow-Up: At  [facility], you and your health needs are our priority.  As part of our continuing mission to provide you with exceptional heart care, we have created designated Provider Care Teams.  These Care Teams include your primary Cardiologist (physician) and Advanced Practice Providers (APPs -  Physician Assistants and Nurse Practitioners) who all work together to provide you with the care you need, when you need it.  We recommend signing up for the patient portal called "MyChart".  Sign up information is provided on this After Visit Summary.  MyChart is used to connect with patients for Virtual Visits (Telemedicine).  Patients are able to view lab/test results, encounter notes, upcoming appointments, etc.  Non-urgent messages can be sent to your provider as well.   To learn more about what you can do with MyChart, go to [email or web address].    Your next appointment:   12 month(s)  Provider:   Evalina Field, MD      Time Spent with Patient: I have spent a total of 25 minutes with patient reviewing hospital notes, telemetry, EKGs, labs and examining the patient as well as establishing an assessment and plan that was discussed with the patient.  > 50% of time was spent in direct patient care.  Signed, Addison Naegeli. Audie Box, MD, [facility]  [address], [place], [place] [address] [phone]  09/05/2022 9:04 AM

## 2022-09-05 ENCOUNTER — Ambulatory Visit: Payer: BC Managed Care – PPO | Attending: Cardiovascular Disease | Admitting: Cardiovascular Disease

## 2022-09-05 ENCOUNTER — Encounter: Payer: Self-pay | Admitting: Cardiovascular Disease

## 2022-09-05 VITALS — BP 115/78 | HR 57 | Ht 69.0 in | Wt 213.0 lb

## 2022-09-05 DIAGNOSIS — I2510 Atherosclerotic heart disease of native coronary artery without angina pectoris: Secondary | ICD-10-CM | POA: Diagnosis not present

## 2022-09-05 DIAGNOSIS — E785 Hyperlipidemia, unspecified: Secondary | ICD-10-CM

## 2022-09-05 NOTE — Patient Instructions (Signed)
Medication Instructions:  The current medical regimen is effective;  continue present plan and medications.  *If you need a refill on your cardiac medications before your next appointment, please call your pharmacy*   Follow-Up: At [facility], you and your health needs are our priority.  As part of our continuing mission to provide you with exceptional heart care, we have created designated Provider Care Teams.  These Care Teams include your primary Cardiologist (physician) and Advanced Practice Providers (APPs -  Physician Assistants and Nurse Practitioners) who all work together to provide you with the care you need, when you need it.  We recommend signing up for the patient portal called "MyChart".  Sign up information is provided on this After Visit Summary.  MyChart is used to connect with patients for Virtual Visits (Telemedicine).  Patients are able to view lab/test results, encounter notes, upcoming appointments, etc.  Non-urgent messages can be sent to your provider as well.   To learn more about what you can do with MyChart, go to [email or web address].    Your next appointment:   12 month(s)  Provider:   Gosper T O'Neal, MD    

## 2022-09-06 ENCOUNTER — Ambulatory Visit: Payer: BC Managed Care – PPO | Admitting: Infectious Disease

## 2022-09-06 MED ORDER — HUMIRA (2 PEN) 40 MG/0.4ML ~~LOC~~ AJKT: AUTO-INJECTOR | SUBCUTANEOUS | 0 refills | Status: DC

## 2022-09-06 NOTE — Telephone Encounter (Signed)
Next Visit: 10/24/2022  Last Visit: 07/25/2022  Last Fill: 02/12/2022  [id number]   Current Dose per office note on 07/25/2022: Currently holding-Humira 40 mg sq injections every 14 days.   Labs: 07/25/2022 BMP WNL. Absolute eosinophils are borderline elevated.  Rest of CBC WNL.  TB Gold: 04/19/2022 positive    Patient underwent treatment for latent TB and resumed humira on 08/22/2022 per patient.   Okay to refill humira?

## 2022-09-10 ENCOUNTER — Other Ambulatory Visit: Payer: Self-pay | Admitting: *Deleted

## 2022-09-10 MED ORDER — PREDNISONE 5 MG PO TABS: ORAL_TABLET | ORAL | 0 refills | Status: AC

## 2022-09-10 NOTE — Telephone Encounter (Signed)
Ok to send in a prednisone taper starting at 20 mg tapering by 5 mg every 4 days. Take prednisone in the morning with food and avoid the use of NSAIDs.

## 2022-09-20 NOTE — Telephone Encounter (Signed)
Please have patient come in to pick up two samples of Humira.  He may take Humira weekly for a month and then go back to Humira every other week.  Intra-articular injection will not work as multiple joints are swollen.  You may give another prednisone taper starting at 20 mg and taper by 5 mg every week.

## 2022-09-20 NOTE — Telephone Encounter (Signed)
Patient restarted Humira on 08/22/22.   Prednisone taper was sent on 09/10/22.

## 2022-09-21 ENCOUNTER — Telehealth: Payer: Self-pay | Admitting: Pharmacist

## 2022-09-21 ENCOUNTER — Other Ambulatory Visit (HOSPITAL_COMMUNITY): Payer: Self-pay

## 2022-09-21 ENCOUNTER — Other Ambulatory Visit: Payer: Self-pay

## 2022-09-21 DIAGNOSIS — Z79899 Other long term (current) drug therapy: Secondary | ICD-10-CM

## 2022-09-21 DIAGNOSIS — M47819 Spondylosis without myelopathy or radiculopathy, site unspecified: Secondary | ICD-10-CM

## 2022-09-21 MED ORDER — HUMIRA (2 PEN) 40 MG/0.4ML ~~LOC~~ AJKT
AUTO-INJECTOR | SUBCUTANEOUS | 1 refills | Status: DC
  Filled 2022-09-21: qty 2, fill #0
  Filled 2022-09-21: qty 2, 28d supply, fill #0
  Filled 2022-10-11: qty 2, 28d supply, fill #1

## 2022-09-21 MED ORDER — PREDNISONE 5 MG PO TABS: ORAL_TABLET | ORAL | 0 refills | Status: AC

## 2022-09-21 NOTE — Telephone Encounter (Signed)
Patient restarted Humira on 09/05/2022. Rx sent to [facility].  [place] Cover for Humira copay card information: ID: [id number] BIN:  [id number] Group: [id number] PCN: [id number]  Added copay card to [facility] today.  Knox Saliva, PharmD, MPH, BCPS, CPP Clinical Pharmacist (Rheumatology and Pulmonology)

## 2022-09-21 NOTE — Telephone Encounter (Signed)
Medication Samples have been provided to the patient.  Drug name: Humira       Strength: 40 mg        Qty: 2  LOT: [id number]   Exp.Date: 05/2023  Dosing instructions: Inject one pen into skin once weekly x 1 month.

## 2022-09-21 NOTE — Addendum Note (Signed)
Addended by: Carole Binning on: 09/21/2022 11:14 AM   Modules accepted: Orders

## 2022-09-21 NOTE — Addendum Note (Signed)
Addended by: [name] on: 09/21/2022 12:18 PM   Modules accepted: Orders

## 2022-09-21 NOTE — Telephone Encounter (Signed)
Delivery instructions have been updated in [facility], medication will be shipped to patient's home address by 09/24/22.  Rx has been processed in [facility] and  Copay card is already on file.

## 2022-09-24 ENCOUNTER — Other Ambulatory Visit: Payer: Self-pay

## 2022-09-24 ENCOUNTER — Other Ambulatory Visit (HOSPITAL_COMMUNITY): Payer: Self-pay

## 2022-10-05 DIAGNOSIS — M79672 Pain in left foot: Secondary | ICD-10-CM | POA: Diagnosis not present

## 2022-10-05 NOTE — Telephone Encounter (Signed)
Lease call and check if patient can be seen by orthopedics for evaluation of foot pain and swelling.  If he cannot get into see orthopedics then we can send an order for x-rays of the foot at the hospital.

## 2022-10-10 NOTE — Progress Notes (Unsigned)
Office Visit Note  Patient: Dennis Todd             Date of Birth: June 16, 1976           MRN: [id number]             PCP: Teressa Senter, FNP Referring: Medicine, [place]* Visit Date: 10/24/2022 Occupation: @GUAROCC @  Subjective:  Pain in right 4th digit   History of Present Illness: Dennis Todd is a 47 y.o. 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  Patient has been injecting Humira 40 mg subcutaneously every week for the past 1 month.  His last dose of Humira was administered 1 week ago.  He was prescribed a prednisone taper on 09/21/2022 starting at 20 mg tapering by 5 mg every 7 days.  He completed the prednisone taper on 10/20/2022.  He noticed very little improvement in his symptoms after increasing the frequency of Humira and taking the prednisone taper.  He states that he did notice weight gain while taking prednisone.  He continues to have pain in the left fourth MCP joint.  He has had difficulty with flexion and extension of his right fourth digit.  He has also had ongoing pain in the left foot at the base of the left fourth metatarsal.  He has been under the care of orthopedics and has had 2 x-rays so far.  He was given a Cam walking boot which he has been wearing when he is symptomatic.  He will likely be proceeding with an MRI of the left foot for further evaluation.  He is not currently experiencing any SI joint pain.  He denies any Achilles tendinitis or plantar fasciitis.  He denies any joint swelling at this time. He denies any recent or recurrent infections.     Activities of Daily Living:  Patient reports morning stiffness for 1 hour.   Patient Denies nocturnal pain.  Difficulty dressing/grooming: Denies Difficulty climbing stairs: Denies Difficulty getting out of chair: Denies Difficulty using hands for taps, buttons, cutlery, and/or writing: Reports  Review of Systems  Constitutional:  Negative for fatigue.  HENT:  Negative for mouth sores and mouth  dryness.   Eyes:  Negative for dryness.  Respiratory:  Positive for shortness of breath.   Cardiovascular:  Positive for chest pain. Negative for palpitations.  Gastrointestinal:  Negative for blood in stool, constipation and diarrhea.  Endocrine: Negative for increased urination.  Genitourinary:  Negative for involuntary urination.  Musculoskeletal:  Positive for joint swelling and morning stiffness. Negative for joint pain, gait problem, joint pain, myalgias, muscle weakness, muscle tenderness and myalgias.  Skin:  Negative for color change, rash, hair loss and sensitivity to sunlight.  Allergic/Immunologic: Negative for susceptible to infections.  Neurological:  Negative for dizziness and headaches.  Hematological:  Negative for swollen glands.  Psychiatric/Behavioral:  Negative for depressed mood and sleep disturbance. The patient is not nervous/anxious.     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    Family History  Problem Relation Age of Onset   Diabetes Father    Rheum arthritis Brother    Stroke Brother    Healthy Daughter    Past Surgical History:  Procedure Laterality Date   ANKLE SURGERY Left    back ablation     back injections     NASAL SINUS SURGERY     Social History   Social History Narrative   Not on file   Immunization History  Administered Date(s) Administered   Influenza,inj,Quad PF,6+ Mos 07/21/2015   Influenza-Unspecified  09/27/2016     Objective: Vital Signs: BP 121/81 (BP Location: Left Arm, Patient Position: Sitting, Cuff Size: Normal)   Pulse 60   Resp 15   Ht 5\' 9"  (1.753 m)   Wt 223 lb (101.2 kg)   BMI 32.93 kg/m    Physical Exam Vitals and nursing note reviewed.  Constitutional:      Appearance: He is well-developed.  HENT:     Head: Normocephalic and atraumatic.  Eyes:     Conjunctiva/sclera: Conjunctivae normal.     Pupils: Pupils are equal, round, and reactive to light.  Cardiovascular:     Rate and Rhythm: Normal rate and regular rhythm.     Heart sounds: Normal heart sounds.  Pulmonary:     Effort: Pulmonary effort is normal.     Breath sounds: Normal breath sounds.  Abdominal:     General: Bowel sounds are normal.     Palpations: Abdomen is soft.  Musculoskeletal:     Cervical back: Normal range of motion and neck supple.  Skin:    General: Skin is warm and dry.     Capillary Refill: Capillary refill takes less than 2 seconds.  Neurological:     Mental Status: He is alert and oriented to person, place, and time.  Psychiatric:        Behavior: Behavior normal.      Musculoskeletal Exam: C-spine has slightly limited range of motion without rotation.  No midline spinal tenderness or SI joint tenderness upon palpation today.  Shoulder joints have good range of motion with no discomfort.  Elbow joints, wrist joints, MCPs, PIPs, DIPs have good range of motion with no synovitis.  Tenderness over the left fourth MCP and along the flexor tendon of the left fourth digit.  Complete fist formation noted bilaterally.  PIP and DIP thickening consistent with osteoarthritis of both hands.  Hip joints have good range of motion with some stiffness but no discomfort.  Knee joints have good range of motion with no warmth or effusion.  Ankle joints have good range of motion with no tenderness or synovitis.  No evidence of Achilles tendinitis or plantar fasciitis.  No tenderness or synovitis over MTP  joints.  Tenderness at the base of the left fifth metatarsal.  CDAI Exam: CDAI Score: -- Patient Global: --; Provider Global: -- Swollen: --; Tender: -- Joint Exam 10/24/2022   No joint exam has been documented for this visit   There is currently no information documented on the homunculus. Go to the Rheumatology activity and complete the homunculus joint exam.  Investigation: No additional findings.  Imaging: US Guided Needle Placement  Result Date: 10/24/2022 Ultrasound guided injection is preferred based studies that show increased duration, increased effect, greater accuracy, decreased procedural pain, increased response rate, and decreased cost with ultrasound guided versus blind injection.   Verbal informed consent obtained.  Time-out conducted.  Noted no overlying  erythema, induration, or other signs of local infection. Ultrasound-guided trigger finger injection: After sterile prep with Betadine, injected 0.5 mL of 1% lidocaine and 20 mg Kenalog using a 27-gauge needle,  in the flexor tendon sheath.     Recent Labs: Lab Results  Component Value Date   WBC 8.2 07/25/2022   HGB 16.8 07/25/2022   PLT 240 07/25/2022   NA 142 07/25/2022   K 4.1 07/25/2022   CL 105 07/25/2022   CO2 27 07/25/2022   GLUCOSE 101 (H) 07/25/2022   BUN 16 07/25/2022   CREATININE 1.03 07/25/2022   BILITOT 0.4 07/20/2022   ALKPHOS 98 07/20/2022   AST 51 (H) 07/20/2022   ALT 72 (H) 07/20/2022   PROT 6.8 07/20/2022   ALBUMIN 4.6 07/20/2022   CALCIUM 9.5 07/25/2022   GFRAA 98 09/09/2020   QFTBGOLDPLUS POSITIVE (A) 04/19/2022    Speciality Comments: FU every 3 months  Procedures:  Hand/UE Inj: L ring A1 for trigger finger on 10/24/2022 12:00 PM Indications: pain, tendon swelling and therapeutic Details: 27 G needle, ultrasound-guided volar approach Medications: 0.5 mL lidocaine 1 %; 20 mg triamcinolone acetonide 40 MG/ML Aspirate: 0 mL Immediately prior to procedure a time out was called to  verify the correct patient, procedure, equipment, support staff and site/side marked as required. Patient was prepped and draped in the usual sterile fashion.     Allergies: Patient has no known allergies.    Assessment / Plan:     Visit Diagnoses: Spondyloarthritis: He has no synovitis or dactylitis on examination today.  He has no midline spinal tenderness or SI joint tenderness upon palpation.  He has not been experiencing any nocturnal pain recently.  His morning stiffness continues to last for about 1 hour daily.  Patient presents today with discomfort in the left fourth MCP joint and along the flexor tendon of the left fourth digit.  He had no synovitis on examination.  Ultrasound-guided left trigger finger injection was performed today in the office.  He is also been having discomfort in the left foot and has tenderness upon palpation at the base of the left fifth metatarsal.  No tenderness or synovitis over MTP joints.  No evidence of Achilles tendinitis or plantar fasciitis.  He has not been under the care of orthopedics and has had 2 x-rays of the left foot and is planning on proceeding with an MRI for further evaluation to rule out a stress fracture. Overall his spondyloarthropathy seems to be well-controlled on the current treatment regimen.  He completed the recommendation to increase frequency of Humira dosing to every week for 1 month.  A prednisone taper was sent to the pharmacy on 09/21/2022 starting 20 mg tapering by 5 mg every week which she completed on 10/20/2022.  He has no active inflammation on examination today.  He will resume Humira dosing: 40 mg every 14 days as prescribed.  He was advised to notify us if he develops signs or symptoms of a flare.  He will follow up in the office in 3 months or sooner if needed.   High risk medication use - -Humira 40 mg sq injections every 14 days. He tried increased the frequency of Humira dosing to once weekly x 1 month.  He will resume Humira  dosing to every 14 days. BMP and CBC drawn on 07/25/2022.  Hepatic function panel drawn on 07/20/2022.  Orders for CBC and CMP were released today.  His next lab work will be due in June and every  3 months to monitor for drug toxicity. Chest x-ray 04/25/2022.  Plan on repeating chest x-ray on a yearly basis given history of latent TB. Discussed the importance of holding Humira if he develops signs or symptoms of infection and to resume once the infection has completely cleared.  Plan: COMPLETE METABOLIC PANEL WITH GFR, CBC with Differential/Platelet  TB lung, latent - Completed 3 month course of  isoniazid, rifampin, and pyridoxin-under care of Dr. Tommy Medal.  He will require yearly CXR for screening.  HLA B27 (HLA B27 positive)  Pain in left finger(s) - He presents today with tenderness over the left fourth MCP and along the flexor tendon of the left fourth digit.  No synovitis noted.  An ultrasound-guided left fourth trigger finger injection was performed today.  He tolerated the procedure well.  Procedure note was completed above.  Aftercare was discussed.  He was advised to notify us if his symptoms persist or worsen.  Plan: US Guided Needle Placement  Trapezius muscle spasm: Improved.  No muscle spasms currently.  Chronic right shoulder pain: Good range of motion of the right shoulder joint on examination today.  Chronic SI joint pain: He has no SI joint tenderness upon palpation today.  He has not been experiencing nocturnal pain.  DDD (degenerative disc disease), lumbar: He has no midline spinal tenderness at this time.  Other medical conditions are listed as follows:   Personal history of chronic sinusitis  Vitamin D deficiency  History of migraine  Tinea versicolor    Orders: Orders Placed This Encounter  Procedures   Hand/UE Inj: L ring A1   US Guided Needle Placement   COMPLETE METABOLIC PANEL WITH GFR   CBC with Differential/Platelet   No orders of the defined types  were placed in this encounter.     Follow-Up Instructions: Return in about 3 months (around 01/24/2023) for Spondyloarthropathy.   Ofilia Neas, PA-C  Note - This record has been created using Dragon software.  Chart creation errors have been sought, but may not always  have been located. Such creation errors do not reflect on  the standard of medical care.

## 2022-10-11 ENCOUNTER — Other Ambulatory Visit (HOSPITAL_COMMUNITY): Payer: Self-pay

## 2022-10-16 ENCOUNTER — Other Ambulatory Visit (HOSPITAL_COMMUNITY): Payer: Self-pay

## 2022-10-19 DIAGNOSIS — M7672 Peroneal tendinitis, left leg: Secondary | ICD-10-CM | POA: Diagnosis not present

## 2022-10-24 ENCOUNTER — Ambulatory Visit: Payer: BC Managed Care – PPO

## 2022-10-24 ENCOUNTER — Encounter: Payer: Self-pay | Admitting: Physician Assistant

## 2022-10-24 ENCOUNTER — Ambulatory Visit: Payer: BC Managed Care – PPO | Attending: Physician Assistant | Admitting: Physician Assistant

## 2022-10-24 VITALS — BP 121/81 | HR 60 | Resp 15 | Ht 69.0 in | Wt 223.0 lb

## 2022-10-24 DIAGNOSIS — M79645 Pain in left finger(s): Secondary | ICD-10-CM

## 2022-10-24 DIAGNOSIS — Z79899 Other long term (current) drug therapy: Secondary | ICD-10-CM | POA: Diagnosis not present

## 2022-10-24 DIAGNOSIS — Z8669 Personal history of other diseases of the nervous system and sense organs: Secondary | ICD-10-CM

## 2022-10-24 DIAGNOSIS — Z1589 Genetic susceptibility to other disease: Secondary | ICD-10-CM

## 2022-10-24 DIAGNOSIS — G8929 Other chronic pain: Secondary | ICD-10-CM

## 2022-10-24 DIAGNOSIS — M25511 Pain in right shoulder: Secondary | ICD-10-CM

## 2022-10-24 DIAGNOSIS — Z227 Latent tuberculosis: Secondary | ICD-10-CM | POA: Diagnosis not present

## 2022-10-24 DIAGNOSIS — M533 Sacrococcygeal disorders, not elsewhere classified: Secondary | ICD-10-CM

## 2022-10-24 DIAGNOSIS — M5136 Other intervertebral disc degeneration, lumbar region: Secondary | ICD-10-CM

## 2022-10-24 DIAGNOSIS — M47819 Spondylosis without myelopathy or radiculopathy, site unspecified: Secondary | ICD-10-CM | POA: Diagnosis not present

## 2022-10-24 DIAGNOSIS — M62838 Other muscle spasm: Secondary | ICD-10-CM

## 2022-10-24 DIAGNOSIS — E559 Vitamin D deficiency, unspecified: Secondary | ICD-10-CM

## 2022-10-24 DIAGNOSIS — B360 Pityriasis versicolor: Secondary | ICD-10-CM

## 2022-10-24 DIAGNOSIS — Z8709 Personal history of other diseases of the respiratory system: Secondary | ICD-10-CM

## 2022-10-24 MED ORDER — TRIAMCINOLONE ACETONIDE 40 MG/ML IJ SUSP
20.0000 mg | INTRAMUSCULAR | Status: AC | PRN
  Administered 2022-10-24: 20 mg

## 2022-10-24 MED ORDER — LIDOCAINE HCL 1 % IJ SOLN
0.5000 mL | INTRAMUSCULAR | Status: AC | PRN
  Administered 2022-10-24: .5 mL

## 2022-10-24 NOTE — Patient Instructions (Signed)
Standing Labs We placed an order today for your standing lab work.   Please have your standing labs drawn in June and every 3 months   Please have your labs drawn 2 weeks prior to your appointment so that the provider can discuss your lab results at your appointment, if possible.  Please note that you may see your imaging and lab results in MyChart before we have reviewed them. We will contact you once all results are reviewed. Please allow our office up to 72 hours to thoroughly review all of the results before contacting the office for clarification of your results.  WALK-IN LAB HOURS  Monday through Thursday from 8:00 am -12:30 pm and 1:00 pm-5:00 pm and Friday from 8:00 am-12:00 pm.  Patients with office visits requiring labs will be seen before walk-in labs.  You may encounter longer than normal wait times. Please allow additional time. Wait times may be shorter on  Monday and Thursday afternoons.  We do not book appointments for walk-in labs. We appreciate your patience and understanding with our staff.   Labs are drawn by [facility]. Please bring your co-pay at the time of your lab draw.  You may receive a bill from [facility] for your lab work.  Please note if you are on Hydroxychloroquine and and an order has been placed for a Hydroxychloroquine level,  you will need to have it drawn 4 hours or more after your last dose.  If you wish to have your labs drawn at another location, please call the office 24 hours in advance so we can fax the orders.  The office is located at [address], [address], [place],  [address]   If you have any questions regarding directions or hours of operation,  please call [phone].   As a reminder, please drink plenty of water prior to coming for your lab work. Thanks!  

## 2022-10-25 LAB — CBC WITH DIFFERENTIAL/PLATELET
Absolute Monocytes: 724 cells/uL (ref 200–950)
Basophils Absolute: 43 cells/uL (ref 0–200)
Basophils Relative: 0.6 %
Eosinophils Absolute: 341 cells/uL (ref 15–500)
Eosinophils Relative: 4.8 %
HCT: 49.4 % (ref 38.5–50.0)
Hemoglobin: 17.4 g/dL — ABNORMAL HIGH (ref 13.2–17.1)
Lymphs Abs: 2045 cells/uL (ref 850–3900)
MCH: 32.4 pg (ref 27.0–33.0)
MCHC: 35.2 g/dL (ref 32.0–36.0)
MCV: 92 fL (ref 80.0–100.0)
MPV: 10.9 fL (ref 7.5–12.5)
Monocytes Relative: 10.2 %
Neutro Abs: 3948 cells/uL (ref 1500–7800)
Neutrophils Relative %: 55.6 %
Platelets: 227 10*3/uL (ref 140–400)
RBC: 5.37 10*6/uL (ref 4.20–5.80)
RDW: 12.3 % (ref 11.0–15.0)
Total Lymphocyte: 28.8 %
WBC: 7.1 10*3/uL (ref 3.8–10.8)

## 2022-10-25 LAB — COMPLETE METABOLIC PANEL WITH GFR
AG Ratio: 1.9 (calc) (ref 1.0–2.5)
ALT: 33 U/L (ref 9–46)
AST: 22 U/L (ref 10–40)
Albumin: 4.3 g/dL (ref 3.6–5.1)
Alkaline phosphatase (APISO): 73 U/L (ref 36–130)
BUN: 21 mg/dL (ref 7–25)
CO2: 28 mmol/L (ref 20–32)
Calcium: 9.3 mg/dL (ref 8.6–10.3)
Chloride: 107 mmol/L (ref 98–110)
Creat: 1.02 mg/dL (ref 0.60–1.29)
Globulin: 2.3 g/dL (calc) (ref 1.9–3.7)
Glucose, Bld: 105 mg/dL — ABNORMAL HIGH (ref 65–99)
Potassium: 4.4 mmol/L (ref 3.5–5.3)
Sodium: 143 mmol/L (ref 135–146)
Total Bilirubin: 0.7 mg/dL (ref 0.2–1.2)
Total Protein: 6.6 g/dL (ref 6.1–8.1)
eGFR: 92 mL/min/{1.73_m2} (ref >=60)

## 2022-10-25 NOTE — Progress Notes (Signed)
Glucose is 105. Rest of CMP WNL.  Hgb is borderline elevated rest of CBC WNL.  We will continue to monitor.

## 2022-10-30 DIAGNOSIS — M25572 Pain in left ankle and joints of left foot: Secondary | ICD-10-CM | POA: Diagnosis not present

## 2022-11-06 DIAGNOSIS — M7672 Peroneal tendinitis, left leg: Secondary | ICD-10-CM | POA: Diagnosis not present

## 2022-11-13 ENCOUNTER — Other Ambulatory Visit: Payer: Self-pay

## 2022-11-13 ENCOUNTER — Telehealth: Payer: Self-pay | Admitting: *Deleted

## 2022-11-13 ENCOUNTER — Other Ambulatory Visit (HOSPITAL_COMMUNITY): Payer: Self-pay

## 2022-11-13 ENCOUNTER — Other Ambulatory Visit: Payer: Self-pay | Admitting: Physician Assistant

## 2022-11-13 DIAGNOSIS — M47819 Spondylosis without myelopathy or radiculopathy, site unspecified: Secondary | ICD-10-CM

## 2022-11-13 DIAGNOSIS — Z79899 Other long term (current) drug therapy: Secondary | ICD-10-CM

## 2022-11-13 MED ORDER — HUMIRA (2 PEN) 40 MG/0.4ML ~~LOC~~ AJKT
AUTO-INJECTOR | SUBCUTANEOUS | 2 refills | Status: DC
Start: 2022-11-13 — End: 2023-02-19
  Filled 2022-11-13: qty 2, 28d supply, fill #0
  Filled 2022-11-13: qty 2, fill #0
  Filled 2022-12-11: qty 2, 28d supply, fill #1
  Filled 2023-01-15: qty 2, 28d supply, fill #2

## 2022-11-13 NOTE — Telephone Encounter (Signed)
Patient states he has been having trouble with a pain in his foot. Patient states he was given a prescription of Mobic from his foot doctor. He would like to make sure that there is no contraindication with the Humira and the Mobic.

## 2022-11-13 NOTE — Telephone Encounter (Signed)
Last Fill: 09/21/2022  Labs: 10/23/2021 Glucose is 105. Rest of CMP WNL. Hgb is borderline elevated rest of CBC WNL  Chest x-ray 04/25/2022   Next Visit: 01/16/2023  Last Visit: 10/24/2022  [id number]   Current Dose per office note 10/23/2021: Humira 40 mg sq injections every 14 days.   Okay to refill Humira?

## 2022-11-13 NOTE — Telephone Encounter (Signed)
Patient advised that there is no contraindication or interaction between meloxicam and Humira. He verbalized understanding. Nothing further needed.  Knox Saliva, PharmD, MPH, BCPS, CPP Clinical Pharmacist (Rheumatology and Pulmonology)

## 2022-11-28 DIAGNOSIS — M5459 Other low back pain: Secondary | ICD-10-CM | POA: Diagnosis not present

## 2022-11-28 DIAGNOSIS — M542 Cervicalgia: Secondary | ICD-10-CM | POA: Diagnosis not present

## 2022-11-28 DIAGNOSIS — G894 Chronic pain syndrome: Secondary | ICD-10-CM | POA: Diagnosis not present

## 2022-11-28 DIAGNOSIS — M47816 Spondylosis without myelopathy or radiculopathy, lumbar region: Secondary | ICD-10-CM | POA: Diagnosis not present

## 2022-12-11 ENCOUNTER — Other Ambulatory Visit (HOSPITAL_COMMUNITY): Payer: Self-pay

## 2022-12-18 ENCOUNTER — Other Ambulatory Visit: Payer: Self-pay

## 2023-01-03 NOTE — Progress Notes (Unsigned)
Office Visit Note  Patient: Dennis Todd             Date of Birth: May 11, 1976           MRN: [id number]             PCP: Lauretta Grill, FNP Referring: Lauretta Grill, FNP Visit Date: 01/16/2023 Occupation: @GUAROCC @  Subjective:  Medication monitoring  History of Present Illness: Dennis Todd is a 47 y.o. male with history of spondyloarthritis and DDD.  Patient remains on Humira 40 mg sq injections every 14 days.  He denies any side effects or injection site reactions with Humira.  He has not missed any doses recently.  He denies any recent or recurrent infections. Patient states last week he injured his lower back lifting a bathtub while working.  He has been taking tramadol 100 mg daily and 50 mg as needed for breakthrough symptoms.  He has ongoing stiffness in both hands but denies any joint swelling.  He denies any Achilles tendinitis or plantar fasciitis.  He denies any signs or symptoms of uveitis.    Activities of Daily Living:  Patient reports morning stiffness for 1-2 hours.   Patient Reports nocturnal pain.  Difficulty dressing/grooming: Denies Difficulty climbing stairs: Denies Difficulty getting out of chair: Reports Difficulty using hands for taps, buttons, cutlery, and/or writing: Reports  Review of Systems  Constitutional:  Positive for fatigue.  HENT:  Positive for mouth dryness. Negative for mouth sores.   Eyes:  Positive for dryness.  Respiratory:  Positive for shortness of breath. Negative for cough and wheezing.   Cardiovascular:  Negative for chest pain and palpitations.  Gastrointestinal:  Negative for blood in stool, constipation and diarrhea.  Endocrine: Negative for increased urination.  Genitourinary:  Negative for involuntary urination.  Musculoskeletal:  Positive for joint pain, gait problem, joint pain, myalgias, morning stiffness, muscle tenderness and myalgias. Negative for joint swelling and muscle weakness.  Skin:  Negative for color  change, rash and sensitivity to sunlight.  Allergic/Immunologic: Negative for susceptible to infections.  Neurological:  Negative for dizziness and headaches.  Hematological:  Negative for swollen glands.  Psychiatric/Behavioral:  Negative for depressed mood and sleep disturbance. The patient is not nervous/anxious.     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    Family History  Problem Relation Age of Onset   Diabetes Father    Rheum arthritis Brother    Stroke Brother    Healthy Daughter    Past Surgical History:  Procedure Laterality Date   ANKLE SURGERY Left    back ablation     back injections     NASAL SINUS SURGERY     Social History   Social History Narrative   Not on file   Immunization History  Administered Date(s) Administered   Influenza,inj,Quad PF,6+ Mos 07/21/2015   Influenza-Unspecified 09/27/2016     Objective: Vital Signs: BP 133/84 (BP Location: Left  Arm, Patient Position: Sitting, Cuff Size: Normal)   Pulse (!) 54   Resp 16   Ht 5\' 9"  (1.753 m)   Wt 208 lb (94.3 kg)   BMI 30.72 kg/m    Physical Exam Vitals and nursing note reviewed.  Constitutional:      Appearance: He is well-developed.  HENT:     Head: Normocephalic and atraumatic.  Eyes:     Conjunctiva/sclera: Conjunctivae normal.     Pupils: Pupils are equal, round, and reactive to light.  Cardiovascular:     Rate and Rhythm: Normal rate and regular  rhythm.     Heart sounds: Normal heart sounds.  Pulmonary:     Effort: Pulmonary effort is normal.     Breath sounds: Normal breath sounds.  Abdominal:     General: Bowel sounds are normal.     Palpations: Abdomen is soft.  Musculoskeletal:     Cervical back: Normal range of motion and neck supple.  Skin:    General: Skin is warm and dry.     Capillary Refill: Capillary refill takes less than 2 seconds.  Neurological:     Mental Status: He is alert and oriented to person, place, and time.  Psychiatric:        Behavior: Behavior normal.      Musculoskeletal Exam: C-spine has good range of motion with no discomfort.  Tenderness over the left SI joint.  No midline spinal tenderness currently.  Some left paraspinal muscle tenderness.  Shoulder joints, elbow joints, wrist joints, MCPs, PIPs, DIPs have good range of motion with no synovitis.  Complete fist formation bilaterally.  PIP and DIP thickening consistent with osteoarthritis of both hands.  Hip joints have good range of motion with no groin pain.  Knee joints have good range of motion with no warmth or effusion.  Ankle joints have good range of motion with no tenderness or joint swelling.  CDAI Exam: CDAI Score: -- Patient Global: --; Provider Global: -- Swollen: --; Tender: -- Joint Exam 01/16/2023   No joint exam has been documented for this visit   There is currently no information documented on the homunculus. Go to the Rheumatology activity and complete the homunculus joint exam.  Investigation: No additional findings.  Imaging: No results found.  Recent Labs: Lab Results  Component Value Date   WBC 7.1 10/24/2022   HGB 17.4 (H) 10/24/2022   PLT 227 10/24/2022   NA 143 10/24/2022   K 4.4 10/24/2022   CL 107 10/24/2022   CO2 28 10/24/2022   GLUCOSE 105 (H) 10/24/2022   BUN 21 10/24/2022   CREATININE 1.02 10/24/2022   BILITOT 0.7 10/24/2022   ALKPHOS 98 07/20/2022   AST 22 10/24/2022   ALT 33 10/24/2022    PROT 6.6 10/24/2022   ALBUMIN 4.6 07/20/2022   CALCIUM 9.3 10/24/2022   GFRAA 98 09/09/2020   QFTBGOLDPLUS POSITIVE (A) 04/19/2022    Speciality Comments: FU every 3 months  Procedures:  No procedures performed Allergies: Patient has no known allergies.   Assessment / Plan:     Visit Diagnoses: Spondyloarthritis: He has no synovitis or dactylitis on examination today.  No evidence of Achilles tendinitis or plantar fasciitis.  No signs or symptoms of uveitis.  He remains on Humira 40 mg subcutaneous injections every 14 days.  He is tolerating Humira without any side effects or injection site reactions.  He has not missed any doses of Humira recently.  Last week he was tweaked his lower back  while working and is having some residual discomfort in the left SI joint and left paraspinal muscles.  He has been having muscle spasms intermittently.  He has been taking tramadol for pain relief.  He has a leftover prescription for methocarbamol which she plans on taking as needed for muscle spasms.   He will remain on Humira as monotherapy.  He was advised to notify us if he develops signs or symptoms of a flare.  He will follow-up in the office in 3 months or sooner if needed.  High risk medication use -Humira 40 mg sq injections every 14 days. CBC and CMP updated on 10/24/22.  Orders for CBC and CMP were released today.  His next lab work will be due in September and every 3 months to monitor for drug toxicity. He will be due to updated chest x-ray in September 2024 No recent or recurrent infections. Discussed the importance of holding Humira if he develops signs or symptoms of an infection and to resume once the infection is completely cleared.  - Plan: COMPLETE METABOLIC PANEL WITH GFR, CBC with Differential/Platelet  TB lung, latent - Completed 3 month course of  isoniazid, rifampin, and pyridoxin-under care of Dr. Daiva Eves.  He will require yearly CXR for screening.  HLA B27 (HLA B27  positive)  Trapezius muscle spasm: No trapezius muscle spasms at this time.   Chronic right shoulder pain: Good ROM with no discomfort at this time.   Chronic SI joint pain: He has some tenderness over the left SI joint on examination today.  Some left paraspinal muscle tenderness was also noted.  He injured his back last week carrying a bathtub for work.  He has been having some muscle spasms and was encouraged to take methocarbamol 500 mg 1 tablet daily as needed for muscle spasms.  He has a prescription of methocarbamol at home.  He has not found tizanidine to be effective in the past.  He was advised notify us if his symptoms persist or worsen.  DDD (degenerative disc disease), lumbar: He takes tramadol 100 mg daily and 50 mg as needed for breakthrough symptoms.  Other medical conditions are listed as follows:   Personal history of chronic sinusitis  Vitamin D deficiency  History of migraine  Tinea versicolor  Orders: Orders Placed This Encounter  Procedures   COMPLETE METABOLIC PANEL WITH GFR   CBC with Differential/Platelet   No orders of the defined types were placed in this encounter.    Follow-Up Instructions: Return in about 3 months (around 04/18/2023) for Spondyloarthritis .   Gearldine Bienenstock, PA-C  Note - This record has been created using Dragon software.  Chart creation errors have been sought, but may not always  have been located. Such creation errors do not reflect on  the standard of medical care.

## 2023-01-15 ENCOUNTER — Other Ambulatory Visit (HOSPITAL_COMMUNITY): Payer: Self-pay

## 2023-01-16 ENCOUNTER — Encounter: Payer: Self-pay | Admitting: Physician Assistant

## 2023-01-16 ENCOUNTER — Ambulatory Visit: Payer: BC Managed Care – PPO | Attending: Physician Assistant | Admitting: Physician Assistant

## 2023-01-16 ENCOUNTER — Other Ambulatory Visit (HOSPITAL_COMMUNITY): Payer: Self-pay

## 2023-01-16 ENCOUNTER — Other Ambulatory Visit: Payer: Self-pay

## 2023-01-16 VITALS — BP 133/84 | HR 54 | Resp 16 | Ht 69.0 in | Wt 208.0 lb

## 2023-01-16 DIAGNOSIS — Z79899 Other long term (current) drug therapy: Secondary | ICD-10-CM | POA: Diagnosis not present

## 2023-01-16 DIAGNOSIS — M533 Sacrococcygeal disorders, not elsewhere classified: Secondary | ICD-10-CM

## 2023-01-16 DIAGNOSIS — M47819 Spondylosis without myelopathy or radiculopathy, site unspecified: Secondary | ICD-10-CM

## 2023-01-16 DIAGNOSIS — Z8669 Personal history of other diseases of the nervous system and sense organs: Secondary | ICD-10-CM

## 2023-01-16 DIAGNOSIS — Z227 Latent tuberculosis: Secondary | ICD-10-CM

## 2023-01-16 DIAGNOSIS — M62838 Other muscle spasm: Secondary | ICD-10-CM

## 2023-01-16 DIAGNOSIS — Z1589 Genetic susceptibility to other disease: Secondary | ICD-10-CM | POA: Diagnosis not present

## 2023-01-16 DIAGNOSIS — G8929 Other chronic pain: Secondary | ICD-10-CM

## 2023-01-16 DIAGNOSIS — M25511 Pain in right shoulder: Secondary | ICD-10-CM

## 2023-01-16 DIAGNOSIS — Z8709 Personal history of other diseases of the respiratory system: Secondary | ICD-10-CM

## 2023-01-16 DIAGNOSIS — E559 Vitamin D deficiency, unspecified: Secondary | ICD-10-CM

## 2023-01-16 DIAGNOSIS — B360 Pityriasis versicolor: Secondary | ICD-10-CM

## 2023-01-16 DIAGNOSIS — M5136 Other intervertebral disc degeneration, lumbar region: Secondary | ICD-10-CM

## 2023-01-16 NOTE — Patient Instructions (Signed)
Standing Labs We placed an order today for your standing lab work.   Please have your standing labs drawn in September and every 3 months   Please have your labs drawn 2 weeks prior to your appointment so that the provider can discuss your lab results at your appointment, if possible.  Please note that you may see your imaging and lab results in MyChart before we have reviewed them. We will contact you once all results are reviewed. Please allow our office up to 72 hours to thoroughly review all of the results before contacting the office for clarification of your results.  WALK-IN LAB HOURS  Monday through Thursday from 8:00 am -12:30 pm and 1:00 pm-5:00 pm and Friday from 8:00 am-12:00 pm.  Patients with office visits requiring labs will be seen before walk-in labs.  You may encounter longer than normal wait times. Please allow additional time. Wait times may be shorter on  Monday and Thursday afternoons.  We do not book appointments for walk-in labs. We appreciate your patience and understanding with our staff.   Labs are drawn by [facility]. Please bring your co-pay at the time of your lab draw.  You may receive a bill from [facility] for your lab work.  Please note if you are on Hydroxychloroquine and and an order has been placed for a Hydroxychloroquine level,  you will need to have it drawn 4 hours or more after your last dose.  If you wish to have your labs drawn at another location, please call the office 24 hours in advance so we can fax the orders.  The office is located at [address], [address], [place], [place] [address]   If you have any questions regarding directions or hours of operation,  please call [phone].   As a reminder, please drink plenty of water prior to coming for your lab work. Thanks!  

## 2023-01-16 NOTE — Progress Notes (Signed)
CBC WNL

## 2023-01-17 LAB — CBC WITH DIFFERENTIAL/PLATELET
Absolute Monocytes: 525 cells/uL (ref 200–950)
Basophils Absolute: 60 cells/uL (ref 0–200)
Basophils Relative: 0.8 %
Eosinophils Absolute: 353 cells/uL (ref 15–500)
Eosinophils Relative: 4.7 %
HCT: 46.6 % (ref 38.5–50.0)
Hemoglobin: 16 g/dL (ref 13.2–17.1)
Lymphs Abs: 2063 cells/uL (ref 850–3900)
MCH: 31.6 pg (ref 27.0–33.0)
MCHC: 34.3 g/dL (ref 32.0–36.0)
MCV: 92.1 fL (ref 80.0–100.0)
MPV: 11.3 fL (ref 7.5–12.5)
Monocytes Relative: 7 %
Neutro Abs: 4500 cells/uL (ref 1500–7800)
Neutrophils Relative %: 60 %
Platelets: 265 10*3/uL (ref 140–400)
RBC: 5.06 10*6/uL (ref 4.20–5.80)
RDW: 12.5 % (ref 11.0–15.0)
Total Lymphocyte: 27.5 %
WBC: 7.5 10*3/uL (ref 3.8–10.8)

## 2023-01-17 LAB — COMPLETE METABOLIC PANEL WITH GFR
AG Ratio: 2.2 (calc) (ref 1.0–2.5)
ALT: 25 U/L (ref 9–46)
AST: 22 U/L (ref 10–40)
Albumin: 4.3 g/dL (ref 3.6–5.1)
Alkaline phosphatase (APISO): 76 U/L (ref 36–130)
BUN: 19 mg/dL (ref 7–25)
CO2: 26 mmol/L (ref 20–32)
Calcium: 9.3 mg/dL (ref 8.6–10.3)
Chloride: 107 mmol/L (ref 98–110)
Creat: 0.97 mg/dL (ref 0.60–1.29)
Globulin: 2 g/dL (calc) (ref 1.9–3.7)
Glucose, Bld: 128 mg/dL — ABNORMAL HIGH (ref 65–99)
Potassium: 4.1 mmol/L (ref 3.5–5.3)
Sodium: 142 mmol/L (ref 135–146)
Total Bilirubin: 0.5 mg/dL (ref 0.2–1.2)
Total Protein: 6.3 g/dL (ref 6.1–8.1)
eGFR: 98 mL/min/{1.73_m2} (ref >=60)

## 2023-01-17 NOTE — Progress Notes (Signed)
Glucose is 128. Rest of CMP WNL

## 2023-02-19 ENCOUNTER — Other Ambulatory Visit (HOSPITAL_COMMUNITY): Payer: Self-pay

## 2023-02-19 ENCOUNTER — Other Ambulatory Visit: Payer: Self-pay

## 2023-02-19 ENCOUNTER — Other Ambulatory Visit: Payer: Self-pay | Admitting: Rheumatology

## 2023-02-19 DIAGNOSIS — M47819 Spondylosis without myelopathy or radiculopathy, site unspecified: Secondary | ICD-10-CM

## 2023-02-19 DIAGNOSIS — Z79899 Other long term (current) drug therapy: Secondary | ICD-10-CM

## 2023-02-19 MED ORDER — HUMIRA (2 PEN) 40 MG/0.4ML ~~LOC~~ AJKT
AUTO-INJECTOR | SUBCUTANEOUS | 2 refills | Status: DC
Start: 2023-02-19 — End: 2023-05-13
  Filled 2023-02-19 (×2): qty 2, 28d supply, fill #0
  Filled 2023-03-27: qty 2, 28d supply, fill #1
  Filled 2023-04-19: qty 2, 28d supply, fill #2

## 2023-02-19 NOTE — Telephone Encounter (Signed)
Last Fill: 11/13/2022  Labs: 01/16/2023 CBC WNL Glucose is 128. Rest of CMP WNL   Chest x-ray 04/25/2022   Next Visit: 04/24/2023  Last Visit: 01/16/2023  [id number]   Current Dose per office note 01/16/2023: Humira 40 mg sq injections every 14 days.   Okay to refill Humira?

## 2023-02-20 ENCOUNTER — Other Ambulatory Visit (HOSPITAL_COMMUNITY): Payer: Self-pay

## 2023-02-27 ENCOUNTER — Other Ambulatory Visit (HOSPITAL_COMMUNITY): Payer: Self-pay

## 2023-03-08 DIAGNOSIS — Z79891 Long term (current) use of opiate analgesic: Secondary | ICD-10-CM | POA: Diagnosis not present

## 2023-03-08 DIAGNOSIS — M5459 Other low back pain: Secondary | ICD-10-CM | POA: Diagnosis not present

## 2023-03-08 DIAGNOSIS — M47816 Spondylosis without myelopathy or radiculopathy, lumbar region: Secondary | ICD-10-CM | POA: Diagnosis not present

## 2023-03-08 DIAGNOSIS — G894 Chronic pain syndrome: Secondary | ICD-10-CM | POA: Diagnosis not present

## 2023-03-08 DIAGNOSIS — M7918 Myalgia, other site: Secondary | ICD-10-CM | POA: Diagnosis not present

## 2023-03-20 ENCOUNTER — Other Ambulatory Visit: Payer: Self-pay | Admitting: Cardiovascular Disease

## 2023-03-27 ENCOUNTER — Other Ambulatory Visit (HOSPITAL_COMMUNITY): Payer: Self-pay

## 2023-04-10 NOTE — Progress Notes (Unsigned)
Office Visit Note  Patient: Dennis Todd             Date of Birth: 1975-11-25           MRN: [id number]             PCP: Lauretta Grill, FNP Referring: Lauretta Grill, FNP Visit Date: 04/24/2023 Occupation: @GUAROCC @  Subjective:  Arthralgias   History of Present Illness: Dennis Todd is a 47 y.o. 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  Patient remains on humira 40 mg sq injections every 14 days.  He continues to tolerate Humira without any side effects or injection site reactions.  He has not missed any doses recently.  He has not had any recent or recurrent infections.  He continues to experience intermittent pain in both hands.  He also has intermittent discomfort in his lower back.  Overall his symptoms remain stable on Humira.  He has not been experiencing any breakthrough symptoms leading up to his Humira injections.  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  He denies any joint swelling at this time.  He has been taking tramadol for pain relief and occasionally will take Goody powder.  Activities of Daily Living:  Patient reports morning stiffness for 1 hour  Patient Denies nocturnal pain.  Difficulty dressing/grooming: Denies Difficulty climbing stairs: Reports Difficulty getting out of chair: Reports Difficulty using hands for taps, buttons, cutlery, and/or writing: Reports  Review of Systems  Constitutional:  Positive for fatigue. Negative for night sweats.  HENT:  Negative for mouth sores, mouth dryness and nose dryness.   Eyes:  Positive for dryness. Negative for redness.  Respiratory:  Negative for shortness of breath and difficulty breathing.   Cardiovascular:  Negative for chest pain, palpitations, hypertension, irregular heartbeat and swelling in legs/feet.  Gastrointestinal:  Negative for constipation and diarrhea.  Endocrine: Negative for increased urination.  Genitourinary:  Negative for painful  urination.  Musculoskeletal:  Positive for joint pain, joint pain and morning stiffness. Negative for joint swelling, myalgias, muscle weakness, muscle tenderness and myalgias.  Skin:  Negative for color change, rash, hair loss, nodules/bumps, skin tightness, ulcers and sensitivity to sunlight.  Allergic/Immunologic: Negative for susceptible to infections.  Neurological:  Negative for dizziness, fainting, memory loss, night sweats and weakness.  Hematological:  Negative for swollen glands.  Psychiatric/Behavioral:  Negative for depressed mood and sleep disturbance. The patient is not nervous/anxious.     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    Family History  Problem Relation Age of Onset   Diabetes Father    Rheum arthritis Brother    Stroke Brother    Healthy Daughter    Past Surgical History:  Procedure Laterality Date   ANKLE SURGERY Left    back ablation     back injections     NASAL SINUS SURGERY     Social History  Social History Narrative   Not on file   Immunization History  Administered Date(s) Administered   Influenza,inj,Quad PF,6+ Mos 07/21/2015   Influenza-Unspecified 09/27/2016     Objective: Vital Signs: BP 127/78 (BP Location: Left Arm, Patient Position: Sitting, Cuff Size: Normal)   Pulse (!) 56   Resp 17   Ht 5\' 9"  (1.753 m)   Wt 206 lb (93.4 kg)   BMI 30.42 kg/m    Physical Exam Vitals and nursing note reviewed.   Constitutional:      Appearance: He is well-developed.  HENT:     Head: Normocephalic and atraumatic.  Eyes:     Conjunctiva/sclera: Conjunctivae normal.     Pupils: Pupils are equal, round, and reactive to light.  Cardiovascular:     Rate and Rhythm: Normal rate and regular rhythm.     Heart sounds: Normal heart sounds.  Pulmonary:     Effort: Pulmonary effort is normal.     Breath sounds: Normal breath sounds.  Abdominal:     General: Bowel sounds are normal.     Palpations: Abdomen is soft.  Musculoskeletal:     Cervical back: Normal range of motion and neck supple.  Skin:    General: Skin is warm and dry.     Capillary Refill: Capillary refill takes less than 2 seconds.  Neurological:     Mental Status: He is alert and oriented to person, place, and time.  Psychiatric:        Behavior: Behavior normal.      Musculoskeletal Exam: C-spine has good range of motion.  Midline spinal tenderness in the thoracic and lumbar region.  Tenderness over both SI joints.  Shoulder joints, elbow joints, wrist joints, MCPs, PIPs, DIPs have good range of motion with no synovitis.  Complete fist formation noted bilaterally.  PIP and DIP thickening consistent with osteoarthritis of both hands.  Hip joints have good range of motion with no groin pain.  Knee joints have good range of motion with no warmth or effusion.  Ankle joints have good range of motion with no tenderness or joint swelling.  No evidence of Achilles tendinitis.  CDAI Exam: CDAI Score: -- Patient Global: --; Provider Global: -- Swollen: --; Tender: -- Joint Exam 04/24/2023   No joint exam has been documented for this visit   There is currently no information documented on the homunculus. Go to the Rheumatology activity and complete the homunculus joint exam.  Investigation: No additional findings.  Imaging: No results found.  Recent Labs: Lab Results  Component Value Date   WBC 7.5 01/16/2023   HGB 16.0 01/16/2023    PLT 265 01/16/2023   NA 142 01/16/2023   K 4.1 01/16/2023   CL 107 01/16/2023   CO2 26 01/16/2023   GLUCOSE 128 (H) 01/16/2023   BUN 19 01/16/2023   CREATININE 0.97 01/16/2023   BILITOT 0.5 01/16/2023   ALKPHOS 98 07/20/2022   AST 22 01/16/2023   ALT 25 01/16/2023   PROT 6.3 01/16/2023   ALBUMIN 4.6 07/20/2022   CALCIUM 9.3 01/16/2023   GFRAA 98 09/09/2020   QFTBGOLDPLUS POSITIVE (A) 04/19/2022    Speciality Comments: FU every 3 months  Procedures:  No procedures performed Allergies: Patient has no known allergies.    Assessment / Plan:     Visit Diagnoses: Spondyloarthritis: No synovitis or dactylitis noted on examination today.  No evidence of Achilles tendinitis or plantar fasciitis.  No signs or symptoms of uveitis.  He has been experiencing intermittent  discomfort in both hands as well as intermittent discomfort in his lower back.  His arthralgias are typically exacerbated by strenuous and repetitive activities at work.  Overall his symptoms remain stable on Humira 40 mg sq injections once every 14 days.  He continues to tolerate Humira without any side effects or injection site reactions.  He has not had any recent or recurrent infections.  He will remain on Humira as monotherapy.  He was advised to notify us if he starts to have breakthrough symptoms leading up to his Humira injections or more frequent flares.  He will follow-up in the office in 3 months or sooner if needed.  High risk medication use - Humira 40 mg sq injections every 14 days.  CBC and CMP updated on 01/16/23. Orders for CBC and CMP released today.   Last CXR performed 04/25/22.  CXR order released.   No recent or recurrent infections.  Discussed the importance of holding Humira if he develops signs or symptoms of an infection and to resume once the infection is completely cleared. - Plan: CBC with Differential/Platelet, COMPLETE METABOLIC PANEL WITH GFR, DG Chest 2 View  TB lung, latent -Completed 3 month  course of isoniazid, rifampin, and pyridoxin-under care of Dr. Daiva Eves. He will require yearly CXR for screening.  Last chest x-ray was performed on 04/25/2022. CXR order placed today.  Plan: DG Chest 2 View  HLA B27 (HLA B27 positive)  Trapezius muscle spasm: No muscle spasms currently.    Chronic right shoulder pain: Not currently symptomatic.  Chronic SI joint pain: Chronic pain bilaterally.  Tenderness over both SI joints noted today.  DDD (degenerative disc disease), lumbar: Midline spinal tenderness in the thoracic and lumbar region noted.  His discomfort is typically exacerbated by strenuous activities at work.  And they were able to no symptoms of radiculopathy at this time.  Other medical conditions are listed as follows:  Personal history of chronic sinusitis  Vitamin D deficiency  History of migraine  Tinea versicolor  Orders: Orders Placed This Encounter  Procedures   DG Chest 2 View   CBC with Differential/Platelet   COMPLETE METABOLIC PANEL WITH GFR   No orders of the defined types were placed in this encounter.    Follow-Up Instructions: Return in about 3 months (around 07/24/2023) for Spondyloarthritis .   Gearldine Bienenstock, PA-C  Note - This record has been created using Dragon software.  Chart creation errors have been sought, but may not always  have been located. Such creation errors do not reflect on  the standard of medical care.

## 2023-04-19 ENCOUNTER — Other Ambulatory Visit (HOSPITAL_COMMUNITY): Payer: Self-pay

## 2023-04-24 ENCOUNTER — Ambulatory Visit: Payer: BC Managed Care – PPO | Attending: Physician Assistant | Admitting: Physician Assistant

## 2023-04-24 ENCOUNTER — Encounter: Payer: Self-pay | Admitting: Physician Assistant

## 2023-04-24 VITALS — BP 127/78 | HR 56 | Resp 17 | Ht 69.0 in | Wt 206.0 lb

## 2023-04-24 DIAGNOSIS — M47819 Spondylosis without myelopathy or radiculopathy, site unspecified: Secondary | ICD-10-CM

## 2023-04-24 DIAGNOSIS — G8929 Other chronic pain: Secondary | ICD-10-CM

## 2023-04-24 DIAGNOSIS — Z79899 Other long term (current) drug therapy: Secondary | ICD-10-CM

## 2023-04-24 DIAGNOSIS — Z8669 Personal history of other diseases of the nervous system and sense organs: Secondary | ICD-10-CM

## 2023-04-24 DIAGNOSIS — Z8709 Personal history of other diseases of the respiratory system: Secondary | ICD-10-CM

## 2023-04-24 DIAGNOSIS — Z227 Latent tuberculosis: Secondary | ICD-10-CM | POA: Diagnosis not present

## 2023-04-24 DIAGNOSIS — M533 Sacrococcygeal disorders, not elsewhere classified: Secondary | ICD-10-CM

## 2023-04-24 DIAGNOSIS — Z1589 Genetic susceptibility to other disease: Secondary | ICD-10-CM | POA: Diagnosis not present

## 2023-04-24 DIAGNOSIS — E559 Vitamin D deficiency, unspecified: Secondary | ICD-10-CM

## 2023-04-24 DIAGNOSIS — M5136 Other intervertebral disc degeneration, lumbar region: Secondary | ICD-10-CM

## 2023-04-24 DIAGNOSIS — M51369 Other intervertebral disc degeneration, lumbar region without mention of lumbar back pain or lower extremity pain: Secondary | ICD-10-CM

## 2023-04-24 DIAGNOSIS — M25511 Pain in right shoulder: Secondary | ICD-10-CM

## 2023-04-24 DIAGNOSIS — M62838 Other muscle spasm: Secondary | ICD-10-CM

## 2023-04-24 DIAGNOSIS — B360 Pityriasis versicolor: Secondary | ICD-10-CM

## 2023-04-24 NOTE — Patient Instructions (Signed)
Standing Labs We placed an order today for your standing lab work.   Please have your standing labs drawn in December and every 3 months   Please have your labs drawn 2 weeks prior to your appointment so that the provider can discuss your lab results at your appointment, if possible.  Please note that you may see your imaging and lab results in MyChart before we have reviewed them. We will contact you once all results are reviewed. Please allow our office up to 72 hours to thoroughly review all of the results before contacting the office for clarification of your results.  WALK-IN LAB HOURS  Monday through Thursday from 8:00 am -12:30 pm and 1:00 pm-5:00 pm and Friday from 8:00 am-12:00 pm.  Patients with office visits requiring labs will be seen before walk-in labs.  You may encounter longer than normal wait times. Please allow additional time. Wait times may be shorter on  Monday and Thursday afternoons.  We do not book appointments for walk-in labs. We appreciate your patience and understanding with our staff.   Labs are drawn by [facility]. Please bring your co-pay at the time of your lab draw.  You may receive a bill from [facility] for your lab work.  Please note if you are on Hydroxychloroquine and and an order has been placed for a Hydroxychloroquine level,  you will need to have it drawn 4 hours or more after your last dose.  If you wish to have your labs drawn at another location, please call the office 24 hours in advance so we can fax the orders.  The office is located at [address], [place], [place] [address]   If you have any questions regarding directions or hours of operation,  please call [phone].   As a reminder, please drink plenty of water prior to coming for your lab work. Thanks!

## 2023-04-25 LAB — COMPLETE METABOLIC PANEL WITH GFR
AG Ratio: 2 (calc) (ref 1.0–2.5)
ALT: 24 U/L (ref 9–46)
AST: 20 U/L (ref 10–40)
Albumin: 4.3 g/dL (ref 3.6–5.1)
Alkaline phosphatase (APISO): 69 U/L (ref 36–130)
BUN: 16 mg/dL (ref 7–25)
CO2: 28 mmol/L (ref 20–32)
Calcium: 9.3 mg/dL (ref 8.6–10.3)
Chloride: 107 mmol/L (ref 98–110)
Creat: 1.06 mg/dL (ref 0.60–1.29)
Globulin: 2.1 g/dL (ref 1.9–3.7)
Glucose, Bld: 127 mg/dL — ABNORMAL HIGH (ref 65–99)
Potassium: 4 mmol/L (ref 3.5–5.3)
Sodium: 141 mmol/L (ref 135–146)
Total Bilirubin: 0.5 mg/dL (ref 0.2–1.2)
Total Protein: 6.4 g/dL (ref 6.1–8.1)
eGFR: 88 mL/min/{1.73_m2} (ref >=60)

## 2023-04-25 LAB — CBC WITH DIFFERENTIAL/PLATELET
Absolute Monocytes: 541 {cells}/uL (ref 200–950)
Basophils Absolute: 40 {cells}/uL (ref 0–200)
Basophils Relative: 0.6 %
Eosinophils Absolute: 469 {cells}/uL (ref 15–500)
Eosinophils Relative: 7.1 %
HCT: 47.5 % (ref 38.5–50.0)
Hemoglobin: 16.2 g/dL (ref 13.2–17.1)
Lymphs Abs: 2066 {cells}/uL (ref 850–3900)
MCH: 31.8 pg (ref 27.0–33.0)
MCHC: 34.1 g/dL (ref 32.0–36.0)
MCV: 93.1 fL (ref 80.0–100.0)
MPV: 11.1 fL (ref 7.5–12.5)
Monocytes Relative: 8.2 %
Neutro Abs: 3485 {cells}/uL (ref 1500–7800)
Neutrophils Relative %: 52.8 %
Platelets: 240 10*3/uL (ref 140–400)
RBC: 5.1 10*6/uL (ref 4.20–5.80)
RDW: 12.7 % (ref 11.0–15.0)
Total Lymphocyte: 31.3 %
WBC: 6.6 10*3/uL (ref 3.8–10.8)

## 2023-04-25 NOTE — Progress Notes (Signed)
Glucose is 127. Rest of CMP WNL.  CBC WNL.

## 2023-05-13 ENCOUNTER — Other Ambulatory Visit: Payer: Self-pay

## 2023-05-13 ENCOUNTER — Other Ambulatory Visit: Payer: Self-pay | Admitting: Physician Assistant

## 2023-05-13 DIAGNOSIS — Z79899 Other long term (current) drug therapy: Secondary | ICD-10-CM

## 2023-05-13 DIAGNOSIS — M47819 Spondylosis without myelopathy or radiculopathy, site unspecified: Secondary | ICD-10-CM

## 2023-05-13 MED ORDER — HUMIRA (2 PEN) 40 MG/0.4ML ~~LOC~~ AJKT
AUTO-INJECTOR | SUBCUTANEOUS | 2 refills | Status: DC
Start: 2023-05-13 — End: 2023-08-16
  Filled 2023-05-13: qty 2, 28d supply, fill #0
  Filled 2023-06-12: qty 2, 28d supply, fill #1
  Filled 2023-07-17 – 2023-07-29 (×2): qty 2, 28d supply, fill #2

## 2023-05-13 NOTE — Progress Notes (Addendum)
Specialty Pharmacy Refill Coordination Note  Dennis Todd is a 47 y.o. male contacted today regarding refills of specialty medication(s) Adalimumab .  Patient requested Delivery  on 05/22/23  to verified address [address] [place] [address]   Medication refill pending. Patient is aware and will be notified of any delays. Medication will be filled on 05/21/23.

## 2023-05-13 NOTE — Telephone Encounter (Signed)
Last Fill: 02/19/2023  Labs: 04/24/2023 Glucose is 127. Rest of CMP WNL. CBC WNL.  Last chest x-ray was performed on 04/25/2022.   Next Visit: 07/31/2023  Last Visit: 04/24/2023  DX: Spondyloarthritis   Current Dose per office note 04/24/2023: Humira 40 mg sq injections every 14 days   Okay to refill Humira?

## 2023-05-13 NOTE — Progress Notes (Signed)
Refill received

## 2023-05-19 ENCOUNTER — Other Ambulatory Visit: Payer: Self-pay | Admitting: Nurse Practitioner

## 2023-05-21 ENCOUNTER — Other Ambulatory Visit: Payer: Self-pay

## 2023-06-11 DIAGNOSIS — M5459 Other low back pain: Secondary | ICD-10-CM | POA: Diagnosis not present

## 2023-06-11 DIAGNOSIS — M47816 Spondylosis without myelopathy or radiculopathy, lumbar region: Secondary | ICD-10-CM | POA: Diagnosis not present

## 2023-06-11 DIAGNOSIS — M542 Cervicalgia: Secondary | ICD-10-CM | POA: Diagnosis not present

## 2023-06-11 DIAGNOSIS — M7918 Myalgia, other site: Secondary | ICD-10-CM | POA: Diagnosis not present

## 2023-06-12 ENCOUNTER — Other Ambulatory Visit: Payer: Self-pay

## 2023-06-12 NOTE — Progress Notes (Signed)
Specialty Pharmacy Refill Coordination Note  Dennis IRAHETA is a 48 y.o. male contacted today regarding refills of specialty medication(s) Adalimumab   Patient requested No data recorded  Delivery date: 06/21/23   Verified address: [address] [place] [address]   Medication will be filled on 06/20/23.

## 2023-06-20 ENCOUNTER — Other Ambulatory Visit: Payer: Self-pay

## 2023-07-17 ENCOUNTER — Other Ambulatory Visit: Payer: Self-pay

## 2023-07-17 NOTE — Progress Notes (Signed)
Specialty Pharmacy Refill Coordination Note  Dennis Todd is a 47 y.o. male contacted today regarding refills of specialty medication(s) Adalimumab   Patient requested Delivery   Delivery date: 07/23/23   Verified address: [address] [name] [address]   Medication will be filled on 07/22/23.

## 2023-07-17 NOTE — Progress Notes (Unsigned)
Office Visit Note  Patient: Dennis Todd             Date of Birth: Jul 28, 1976           MRN: [id number]             PCP: Lauretta Grill, FNP Referring: Lauretta Grill, FNP Visit Date: 07/31/2023 Occupation: @GUAROCC @  Subjective:  Medication monitoring.  History of Present Illness: Dennis Todd is a 47 y.o. 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 Patient remains on  Humira 40 mg sq injections every 14 days.  He is tolerating Humira without any side effects or injection site reactions.  He has not had any recent gaps in therapy.  Patient continues to have chronic pain and stiffness involving multiple joints.  Patient states that on Monday he experienced increased pain and inflammation involving his right thumb.  He was having difficulty using his right thumb but his symptoms have gradually started to improve.  He continues to have chronic soreness involving both SI joints but has declined a cortisone injection today.  Patient continues to work a physically demanding job.  His morning stiffness has been lasting for 2 hours daily. He denies any recent or recurrent infections.  He denies any new medical conditions.   Activities of Daily Living:  Patient reports morning stiffness for 2 hours.   Patient Reports nocturnal pain.  Difficulty dressing/grooming: Denies Difficulty climbing stairs: Denies Difficulty getting out of chair: Denies Difficulty using hands for taps, buttons, cutlery, and/or writing: Reports  Review of Systems  Constitutional:  Negative for fatigue.  HENT:  Negative for mouth sores and mouth dryness.   Eyes:  Negative for dryness.  Respiratory:  Negative for shortness of breath.   Cardiovascular:  Negative for chest pain and palpitations.  Gastrointestinal:  Negative for blood in stool, constipation and diarrhea.  Endocrine: Negative for increased urination.  Genitourinary:  Negative for involuntary urination.  Musculoskeletal:  Positive for joint pain,  joint pain, myalgias, muscle weakness, morning stiffness, muscle tenderness and myalgias. Negative for gait problem and joint swelling.  Skin:  Negative for color change, rash, hair loss and sensitivity to sunlight.  Allergic/Immunologic: Negative for susceptible to infections.  Neurological:  Negative for dizziness and headaches.  Hematological:  Negative for swollen glands.  Psychiatric/Behavioral:  Negative for depressed mood and sleep disturbance. The patient is not nervous/anxious.     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    Family History  Problem Relation Age of Onset   Diabetes Father    Rheum arthritis Brother    Stroke Brother    Healthy Daughter    Past Surgical History:  Procedure Laterality Date   ANKLE SURGERY Left    back ablation     back injections     NASAL SINUS SURGERY     Social History   Social History Narrative   Not on file   Immunization History  Administered Date(s)  Administered   Influenza,inj,Quad PF,6+ Mos 07/21/2015   Influenza-Unspecified 09/27/2016     Objective: Vital Signs: BP 114/79 (BP Location: Left Arm, Patient Position: Sitting, Cuff Size: Normal)   Pulse 62   Resp 15   Ht 5\' 9"  (1.753 m)   Wt 203 lb (92.1 kg)   BMI 29.98 kg/m    Physical Exam Vitals and nursing note reviewed.  Constitutional:      Appearance: He is well-developed.  HENT:     Head: Normocephalic and atraumatic.  Eyes:      Conjunctiva/sclera: Conjunctivae normal.     Pupils: Pupils are equal, round, and reactive to light.  Cardiovascular:     Rate and Rhythm: Normal rate and regular rhythm.     Heart sounds: Normal heart sounds.  Pulmonary:     Effort: Pulmonary effort is normal.     Breath sounds: Normal breath sounds.  Abdominal:     General: Bowel sounds are normal.     Palpations: Abdomen is soft.  Musculoskeletal:     Cervical back: Normal range of motion and neck supple.  Skin:    General: Skin is warm and dry.     Capillary Refill: Capillary refill takes less than 2 seconds.  Neurological:     Mental Status: He is alert and oriented to person, place, and time.  Psychiatric:        Behavior: Behavior normal.      Musculoskeletal Exam: C-spine has good range of motion with some discomfort with lateral rotation.  Limited mobility of the lumbar spine.  Some midline spinal tenderness in the lumbar region and over both SI joints.  Shoulder joints, elbow joints, wrist joints, MCPs, PIPs, DIPs have good range of motion with no tenderness or synovitis.  Complete fist formation noted.  PIP and DIP thickening noted.  Tenderness of the right first PIP joint.  Hip joints have good range of motion with some stiffness bilaterally.  Knee joints have good range of motion no warmth or effusion.  Ankle joints have good range of motion with no tenderness or joint swelling.  No evidence of Achilles tendinitis.  CDAI Exam: CDAI Score: -- Patient Global: --; Provider Global: -- Swollen: 0 ; Tender: 3  Joint Exam 07/31/2023      Right  Left  IP (thumb)   Tender     Sacroiliac   Tender   Tender     Investigation: No additional findings.  Imaging: No results found.  Recent Labs: Lab Results  Component Value Date   WBC 6.6 04/24/2023   HGB 16.2 04/24/2023   PLT 240 04/24/2023   NA 141 04/24/2023   K 4.0 04/24/2023   CL 107 04/24/2023   CO2 28 04/24/2023   GLUCOSE 127 (H) 04/24/2023   BUN 16 04/24/2023    CREATININE 1.06 04/24/2023   BILITOT 0.5 04/24/2023   ALKPHOS 98 07/20/2022   AST 20 04/24/2023   ALT 24 04/24/2023   PROT 6.4 04/24/2023   ALBUMIN 4.6 07/20/2022   CALCIUM 9.3 04/24/2023   GFRAA 98 09/09/2020   QFTBGOLDPLUS POSITIVE (A) 04/19/2022    Speciality Comments: FU every 3 months  Procedures:  No procedures performed Allergies: Patient has no known allergies.   Assessment / Plan:     Visit Diagnoses: Spondyloarthritis: Patient remains on Humira 40 mg sq injections every 14 days.  He continues to have chronic pain involving multiple joints as well as stiffness in the mornings lasting for about 2 hours.  He has  chronic soreness involving both SI joints but declined an SI joint cortisone injection today.  On Monday he experienced a flare involving his right thumb to the point he was having difficulty flexing his right first PIP joint.  Tenderness of the right first PIP joint was noted today.  His symptoms have gradually started to improve.  Declined a prednisone taper at this time.  Overall the patient continues to find Humira to be effective at managing his symptoms.  He continues to take tramadol daily for pain relief.  No medication changes will be made at this time.  He is vies notify us if he develops signs or symptoms of a flare.  He will follow-up in the office in 3 months or sooner if needed.  High risk medication use - Humira 40 mg sq injections every 14 days. CBC and CMP updated on 04/24/23. Orders for CBC and CMP released today.  CXR 04/25/22.  Overdue to update CXR--future order remains in place.  No recent or recurrent infections.  Discussed the importance of holding humira if he develops signs or symptoms of an infection and to resume once the infection has completely cleared.    - Plan: CBC with Differential/Platelet, COMPLETE METABOLIC PANEL WITH GFR  TB lung, latent - Completed 3 month course of isoniazid, rifampin, and pyridoxin-under care of Dr. Daiva Eves. He will  require yearly CXR for screening.  Chest x-ray order remains in place.  Patient was encouraged to have his checks x-ray performed ASAP.   HLA B27 (HLA B27 positive)  Trapezius muscle spasm: Not currently experiencing any muscle spasms.   Chronic right shoulder pain: Good ROM with no discomfort currently.   Chronic SI joint pain: Patient continues to have chronic soreness involving both SI joints.  He declined a SI joint injection at this time.  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  Some midline spinal tenderness.  No symptoms of radiculopathy.  He takes tramadol 100 mg daily for pain relief and 50 mg as needed for breakthrough symptoms.   Other medical conditions are listed as follows:   Personal history of chronic sinusitis  Vitamin D deficiency  History of migraine  Tinea versicolor  Orders: Orders Placed This Encounter  Procedures   CBC with Differential/Platelet   COMPLETE METABOLIC PANEL WITH GFR   No orders of the defined types were placed in this encounter.   Follow-Up Instructions: Return in about 3 months (around 10/29/2023) for Spondylarthritis.   Gearldine Bienenstock, PA-C  Note - This record has been created using Dragon software.  Chart creation errors have been sought, but may not always  have been located. Such creation errors do not reflect on  the standard of medical care.

## 2023-07-22 ENCOUNTER — Other Ambulatory Visit: Payer: Self-pay

## 2023-07-23 ENCOUNTER — Other Ambulatory Visit (HOSPITAL_COMMUNITY): Payer: Self-pay

## 2023-07-23 ENCOUNTER — Other Ambulatory Visit: Payer: Self-pay

## 2023-07-24 ENCOUNTER — Other Ambulatory Visit (HOSPITAL_COMMUNITY): Payer: Self-pay

## 2023-07-29 ENCOUNTER — Other Ambulatory Visit (HOSPITAL_COMMUNITY): Payer: Self-pay

## 2023-07-29 ENCOUNTER — Other Ambulatory Visit: Payer: Self-pay

## 2023-07-31 ENCOUNTER — Ambulatory Visit: Payer: BC Managed Care – PPO | Attending: Physician Assistant | Admitting: Physician Assistant

## 2023-07-31 ENCOUNTER — Encounter: Payer: Self-pay | Admitting: Physician Assistant

## 2023-07-31 ENCOUNTER — Telehealth: Payer: Self-pay | Admitting: Pharmacist

## 2023-07-31 VITALS — BP 114/79 | HR 62 | Resp 15 | Ht 69.0 in | Wt 203.0 lb

## 2023-07-31 DIAGNOSIS — Z79899 Other long term (current) drug therapy: Secondary | ICD-10-CM | POA: Diagnosis not present

## 2023-07-31 DIAGNOSIS — Z1589 Genetic susceptibility to other disease: Secondary | ICD-10-CM | POA: Diagnosis not present

## 2023-07-31 DIAGNOSIS — M47819 Spondylosis without myelopathy or radiculopathy, site unspecified: Secondary | ICD-10-CM | POA: Diagnosis not present

## 2023-07-31 DIAGNOSIS — M533 Sacrococcygeal disorders, not elsewhere classified: Secondary | ICD-10-CM

## 2023-07-31 DIAGNOSIS — E559 Vitamin D deficiency, unspecified: Secondary | ICD-10-CM

## 2023-07-31 DIAGNOSIS — Z8709 Personal history of other diseases of the respiratory system: Secondary | ICD-10-CM

## 2023-07-31 DIAGNOSIS — M51369 Other intervertebral disc degeneration, lumbar region without mention of lumbar back pain or lower extremity pain: Secondary | ICD-10-CM

## 2023-07-31 DIAGNOSIS — B360 Pityriasis versicolor: Secondary | ICD-10-CM

## 2023-07-31 DIAGNOSIS — Z227 Latent tuberculosis: Secondary | ICD-10-CM | POA: Diagnosis not present

## 2023-07-31 DIAGNOSIS — G8929 Other chronic pain: Secondary | ICD-10-CM

## 2023-07-31 DIAGNOSIS — M25511 Pain in right shoulder: Secondary | ICD-10-CM

## 2023-07-31 DIAGNOSIS — Z8669 Personal history of other diseases of the nervous system and sense organs: Secondary | ICD-10-CM

## 2023-07-31 DIAGNOSIS — M62838 Other muscle spasm: Secondary | ICD-10-CM

## 2023-07-31 NOTE — Telephone Encounter (Signed)
Patient had OV today and requested we work on [employer] when able to prevent gap in therapy. Current PA expires 08/16/23

## 2023-07-31 NOTE — Patient Instructions (Signed)
 Standing Labs We placed an order today for your standing lab work.   Please have your standing labs drawn in march and every 3 months   Please have your labs drawn 2 weeks prior to your appointment so that the provider can discuss your lab results at your appointment, if possible.  Please note that you may see your imaging and lab results in MyChart before we have reviewed them. We will contact you once all results are reviewed. Please allow our office up to 72 hours to thoroughly review all of the results before contacting the office for clarification of your results.  WALK-IN LAB HOURS  Monday through Thursday from 8:00 am -12:30 pm and 1:00 pm-5:00 pm and Friday from 8:00 am-12:00 pm.  Patients with office visits requiring labs will be seen before walk-in labs.  You may encounter longer than normal wait times. Please allow additional time. Wait times may be shorter on  Monday and Thursday afternoons.  We do not book appointments for walk-in labs. We appreciate your patience and understanding with our staff.   Labs are drawn by [facility]. Please bring your co-pay at the time of your lab draw.  You may receive a bill from [facility] for your lab work.  Please note if you are on Hydroxychloroquine and and an order has been placed for a Hydroxychloroquine level,  you will need to have it drawn 4 hours or more after your last dose.  If you wish to have your labs drawn at another location, please call the office 24 hours in advance so we can fax the orders.  The office is located at [address], [address], [place], [place] [address]   If you have any questions regarding directions or hours of operation,  please call [phone].   As a reminder, please drink plenty of water prior to coming for your lab work. Thanks!

## 2023-08-01 LAB — CBC WITH DIFFERENTIAL/PLATELET
Absolute Lymphocytes: 2057 {cells}/uL (ref 850–3900)
Absolute Monocytes: 729 {cells}/uL (ref 200–950)
Basophils Absolute: 41 {cells}/uL (ref 0–200)
Basophils Relative: 0.5 %
Eosinophils Absolute: 429 {cells}/uL (ref 15–500)
Eosinophils Relative: 5.3 %
HCT: 47.9 % (ref 38.5–50.0)
Hemoglobin: 16.4 g/dL (ref 13.2–17.1)
MCH: 31.9 pg (ref 27.0–33.0)
MCHC: 34.2 g/dL (ref 32.0–36.0)
MCV: 93.2 fL (ref 80.0–100.0)
MPV: 10.9 fL (ref 7.5–12.5)
Monocytes Relative: 9 %
Neutro Abs: 4844 {cells}/uL (ref 1500–7800)
Neutrophils Relative %: 59.8 %
Platelets: 265 10*3/uL (ref 140–400)
RBC: 5.14 10*6/uL (ref 4.20–5.80)
RDW: 12.3 % (ref 11.0–15.0)
Total Lymphocyte: 25.4 %
WBC: 8.1 10*3/uL (ref 3.8–10.8)

## 2023-08-01 LAB — COMPLETE METABOLIC PANEL WITH GFR
AG Ratio: 1.7 (calc) (ref 1.0–2.5)
ALT: 26 U/L (ref 9–46)
AST: 26 U/L (ref 10–40)
Albumin: 4.2 g/dL (ref 3.6–5.1)
Alkaline phosphatase (APISO): 75 U/L (ref 36–130)
BUN: 17 mg/dL (ref 7–25)
CO2: 27 mmol/L (ref 20–32)
Calcium: 9.6 mg/dL (ref 8.6–10.3)
Chloride: 105 mmol/L (ref 98–110)
Creat: 1 mg/dL (ref 0.60–1.29)
Globulin: 2.5 g/dL (ref 1.9–3.7)
Glucose, Bld: 102 mg/dL — ABNORMAL HIGH (ref 65–99)
Potassium: 4.2 mmol/L (ref 3.5–5.3)
Sodium: 141 mmol/L (ref 135–146)
Total Bilirubin: 0.7 mg/dL (ref 0.2–1.2)
Total Protein: 6.7 g/dL (ref 6.1–8.1)
eGFR: 94 mL/min/{1.73_m2} (ref >=60)

## 2023-08-01 NOTE — Progress Notes (Signed)
CBC and CMP WNL

## 2023-08-02 NOTE — Telephone Encounter (Signed)
Submitted a Prior Authorization RENEWAL request to [facility] for HUMIRA via CoverMyMeds. Will update once we receive a response.  Key: [id number]  Chesley Mires, PharmD, MPH, BCPS, CPP Clinical Pharmacist (Rheumatology and Pulmonology)

## 2023-08-05 NOTE — Telephone Encounter (Signed)
Received notification from [facility] regarding a prior authorization for HUMIRA. Authorization has been APPROVED from 08/02/2023 to 08/01/2024. Approval letter sent to scan center.  Patient can continue to fill through [facility]: [phone]   Authorization # [id number]  Chesley Mires, PharmD, MPH, BCPS, CPP Clinical Pharmacist (Rheumatology and Pulmonology)

## 2023-08-12 ENCOUNTER — Other Ambulatory Visit (HOSPITAL_COMMUNITY): Payer: Self-pay

## 2023-08-15 ENCOUNTER — Other Ambulatory Visit: Payer: Self-pay | Admitting: Rheumatology

## 2023-08-15 ENCOUNTER — Other Ambulatory Visit: Payer: Self-pay

## 2023-08-15 ENCOUNTER — Other Ambulatory Visit: Payer: Self-pay | Admitting: Nurse Practitioner

## 2023-08-15 DIAGNOSIS — Z79899 Other long term (current) drug therapy: Secondary | ICD-10-CM

## 2023-08-15 DIAGNOSIS — M47819 Spondylosis without myelopathy or radiculopathy, site unspecified: Secondary | ICD-10-CM

## 2023-08-15 NOTE — Progress Notes (Signed)
 Specialty Pharmacy Refill Coordination Note  Dennis Todd is a 48 y.o. male contacted today regarding refills of specialty medication(s) Adalimumab  (Humira  (2 Pen))   Patient requested Delivery   Delivery date: 08/21/23   Verified address: [address]   Medication will be filled on 08/20/23. Pending Refill Request

## 2023-08-15 NOTE — Progress Notes (Signed)
 Specialty Pharmacy Ongoing Clinical Assessment Note  Dennis Todd is a 48 y.o. male who is being followed by the specialty pharmacy service for RxSp Ankylosing Spondylitis   Patient's specialty medication(s) reviewed today: Adalimumab  (Humira  (2 Pen))   Missed doses in the last 4 weeks: 0   Patient/Caregiver did not have any additional questions or concerns.   Therapeutic benefit summary: Patient is achieving benefit   Adverse events/side effects summary: No adverse events/side effects   Patient's therapy is appropriate to: Continue    Goals Addressed             This Visit's Progress    Minimize recurrence of flares       Patient is on track. Patient will maintain adherence         Follow up:  6 months  Mitzie GORMAN [facility] Specialty Pharmacist

## 2023-08-16 ENCOUNTER — Ambulatory Visit: Payer: BC Managed Care – PPO

## 2023-08-16 ENCOUNTER — Other Ambulatory Visit: Payer: Self-pay

## 2023-08-16 ENCOUNTER — Other Ambulatory Visit (HOSPITAL_COMMUNITY): Payer: Self-pay

## 2023-08-16 ENCOUNTER — Other Ambulatory Visit: Payer: Self-pay | Admitting: Rheumatology

## 2023-08-16 DIAGNOSIS — Z227 Latent tuberculosis: Secondary | ICD-10-CM

## 2023-08-16 DIAGNOSIS — Z79899 Other long term (current) drug therapy: Secondary | ICD-10-CM

## 2023-08-16 DIAGNOSIS — M47819 Spondylosis without myelopathy or radiculopathy, site unspecified: Secondary | ICD-10-CM

## 2023-08-16 DIAGNOSIS — R0989 Other specified symptoms and signs involving the circulatory and respiratory systems: Secondary | ICD-10-CM | POA: Diagnosis not present

## 2023-08-16 MED ORDER — HUMIRA (2 PEN) 40 MG/0.4ML ~~LOC~~ AJKT
AUTO-INJECTOR | SUBCUTANEOUS | 0 refills | Status: DC
  Filled 2023-08-16: qty 2, 28d supply, fill #0

## 2023-08-16 NOTE — Progress Notes (Signed)
 Lvm- refill denied

## 2023-08-16 NOTE — Telephone Encounter (Signed)
 Last Fill: 05/13/2023  Labs: 07/31/2023 CBC and CMP WNL   TB Gold: 04/19/2022 Positive  Completed 3 month course of isoniazid , rifampin , and pyridoxin-under care of Dr. Fleeta Rothman. He will require yearly CXR for screening.   Next Visit: 10/30/2023  Last Visit: 07/31/2023  [id number]   Current Dose per office note 07/31/2023: Humira  40 mg sq injections every 14 days.   Contacted the patient and advised he needs a chest xray done. Patient states he will try to go get the xray done today.   Okay to refill Humira ?

## 2023-08-20 ENCOUNTER — Other Ambulatory Visit: Payer: Self-pay

## 2023-08-24 NOTE — Progress Notes (Signed)
 Chest xray is normal

## 2023-09-06 ENCOUNTER — Other Ambulatory Visit (HOSPITAL_COMMUNITY): Payer: Self-pay

## 2023-09-09 ENCOUNTER — Other Ambulatory Visit: Payer: Self-pay

## 2023-09-09 ENCOUNTER — Other Ambulatory Visit: Payer: Self-pay | Admitting: Physician Assistant

## 2023-09-09 DIAGNOSIS — M47819 Spondylosis without myelopathy or radiculopathy, site unspecified: Secondary | ICD-10-CM

## 2023-09-09 DIAGNOSIS — Z79899 Other long term (current) drug therapy: Secondary | ICD-10-CM

## 2023-09-09 MED ORDER — HUMIRA (2 PEN) 40 MG/0.4ML ~~LOC~~ AJKT
AUTO-INJECTOR | SUBCUTANEOUS | 2 refills | Status: DC
  Filled 2023-09-10: qty 2, 28d supply, fill #0
  Filled 2023-10-18: qty 2, 28d supply, fill #1
  Filled 2023-11-14: qty 2, 28d supply, fill #2

## 2023-09-09 NOTE — Telephone Encounter (Signed)
Last Fill: 08/16/2023 (30 day supply)  Labs: 07/31/2023 CBC and CMP WNL   Yearly Chest X-ray: 08/16/2023 Chest x-ray is normal.   Next Visit: 10/30/2023  Last Visit: 07/31/2023  [id number]   Current Dose per office note 07/31/2023: Humira 40 mg sq injections every 14 days.    Okay to refill Humira?

## 2023-09-09 NOTE — Progress Notes (Signed)
Specialty Pharmacy Refill Coordination Note  Dennis Todd is a 48 y.o. male contacted today regarding refills of specialty medication(s) Adalimumab (Humira (2 Pen))   Patient requested Delivery   Delivery date: 09/18/23   Verified address: [address]  [name] [address]   Medication will be filled on 09/17/23 pending a refill request.

## 2023-09-10 ENCOUNTER — Other Ambulatory Visit: Payer: Self-pay

## 2023-09-11 ENCOUNTER — Encounter: Payer: Self-pay | Admitting: Nurse Practitioner

## 2023-09-11 ENCOUNTER — Ambulatory Visit: Payer: BC Managed Care – PPO | Attending: Nurse Practitioner | Admitting: Nurse Practitioner

## 2023-09-11 ENCOUNTER — Ambulatory Visit: Payer: BC Managed Care – PPO | Admitting: Nurse Practitioner

## 2023-09-11 VITALS — BP 118/76 | HR 52 | Ht 69.0 in | Wt 208.6 lb

## 2023-09-11 DIAGNOSIS — M459 Ankylosing spondylitis of unspecified sites in spine: Secondary | ICD-10-CM

## 2023-09-11 DIAGNOSIS — R001 Bradycardia, unspecified: Secondary | ICD-10-CM

## 2023-09-11 DIAGNOSIS — I4510 Unspecified right bundle-branch block: Secondary | ICD-10-CM

## 2023-09-11 DIAGNOSIS — I25118 Atherosclerotic heart disease of native coronary artery with other forms of angina pectoris: Secondary | ICD-10-CM | POA: Diagnosis not present

## 2023-09-11 DIAGNOSIS — Z720 Tobacco use: Secondary | ICD-10-CM

## 2023-09-11 DIAGNOSIS — E785 Hyperlipidemia, unspecified: Secondary | ICD-10-CM | POA: Diagnosis not present

## 2023-09-11 LAB — HEPATIC FUNCTION PANEL
ALT: 22 [IU]/L (ref 0–44)
AST: 21 [IU]/L (ref 0–40)
Albumin: 4.5 g/dL (ref 4.1–5.1)
Alkaline Phosphatase: 107 [IU]/L (ref 44–121)
Bilirubin Total: 0.7 mg/dL (ref 0.0–1.2)
Bilirubin, Direct: 0.24 mg/dL (ref 0.00–0.40)
Total Protein: 6.6 g/dL (ref 6.0–8.5)

## 2023-09-11 LAB — LIPID PANEL
Chol/HDL Ratio: 3.3 {ratio} (ref 0.0–5.0)
Cholesterol, Total: 113 mg/dL (ref 100–199)
HDL: 34 mg/dL — ABNORMAL LOW (ref >39)
LDL Chol Calc (NIH): 60 mg/dL (ref 0–99)
Triglycerides: 97 mg/dL (ref 0–149)
VLDL Cholesterol Cal: 19 mg/dL (ref 5–40)

## 2023-09-11 NOTE — Patient Instructions (Addendum)
Medication Instructions:  Restart Asprin EC 81 mg   *If you need a refill on your cardiac medications before your next appointment, please call your pharmacy*   Lab Work: Fasting Lipid Panel, LFTs today  Testing/Procedures: NONE ordered at this time of appointment   Follow-Up: At [facility], you and your health needs are our priority.  As part of our continuing mission to provide you with exceptional heart care, we have created designated Provider Care Teams.  These Care Teams include your primary Cardiologist (physician) and Advanced Practice Providers (APPs -  Physician Assistants and Nurse Practitioners) who all work together to provide you with the care you need, when you need it.  We recommend signing up for the patient portal called "MyChart".  Sign up information is provided on this After Visit Summary.  MyChart is used to connect with patients for Virtual Visits (Telemedicine).  Patients are able to view lab/test results, encounter notes, upcoming appointments, etc.  Non-urgent messages can be sent to your provider as well.   To learn more about what you can do with MyChart, go to [email or web address].    Your next appointment:   1 year(s)  Provider:   Reatha Harps, MD

## 2023-09-11 NOTE — Progress Notes (Signed)
Office Visit    Patient Name: Dennis Todd Date of Encounter: 09/11/2023  Primary Care Provider:  Lauretta Grill, FNP Primary Cardiologist:  Reatha Harps, MD  Chief Complaint    48 year old male with a history of CAD, RBBB, hyperlipidemia, ankylosing spondylitis, latent TB, migraines, and tobacco use who presents for follow-up related to CAD and hyperlipidemia.   Past Medical History    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/25/2022   Past Surgical History:  Procedure Laterality Date   ANKLE SURGERY Left    back ablation     back injections     NASAL SINUS SURGERY      Allergies  No Known Allergies   Labs/Other Studies Reviewed    The following studies were reviewed today:  Cardiac Studies & Procedures      ECHOCARDIOGRAM  ECHOCARDIOGRAM COMPLETE 02/20/2022  Narrative ECHOCARDIOGRAM REPORT    Patient Name:   Dennis Todd Date of Exam: 02/20/2022 Medical Rec #:  [id number]       Height:       69.0 in Accession #:    [id number]      Weight:       210.8 lb Date of Birth:  Aug 21, 1975       BSA:          2.113 m Patient Age:    46 years        BP:           130/72 mmHg Patient Gender: M               HR:           56 bpm. Exam Location:  [address]  Procedure: 2D Echo, 3D Echo, Cardiac Doppler, Color Doppler and Strain Analysis  Indications:    R07.2 Precordial pain  History:        Patient has no prior history of Echocardiogram examinations. Arrythmias:RBBB; Risk Factors:Former Smoker.  Sonographer:    Jorje Guild BS, RDCS Referring Phys: [id number] Ronnald Ramp O'NEAL  IMPRESSIONS   1. Left ventricular ejection fraction, by estimation, is 60 to 65%. The left ventricle has normal function. The left ventricle has no regional wall motion abnormalities. Left ventricular diastolic parameters were normal. The average left ventricular global longitudinal  strain is -22.4 %. The global longitudinal strain is normal. 2. Right ventricular systolic function is normal. The right ventricular size is normal. 3. The mitral valve is normal in structure. No evidence of mitral valve regurgitation. No evidence of mitral stenosis. 4. The aortic valve is tricuspid. Aortic valve regurgitation is not visualized. No aortic stenosis is present. 5. The inferior vena cava is normal in size with greater than 50% respiratory variability, suggesting right atrial pressure of 3 mmHg.  Conclusion(s)/Recommendation(s): Normal biventricular function without evidence of hemodynamically significant valvular heart disease.  FINDINGS Left Ventricle: Left ventricular ejection fraction, by estimation, is 60 to 65%. The left ventricle has normal function. The left ventricle has no regional wall motion abnormalities. The average left ventricular global longitudinal strain is -22.4 %. The global longitudinal strain is normal. The left ventricular internal cavity size was normal in size. There is no left ventricular hypertrophy. Left ventricular diastolic parameters were normal. Normal left ventricular filling pressure.  Right Ventricle: The right ventricular size is normal. No increase in right ventricular wall thickness. Right ventricular systolic function is normal.  Left Atrium: Left atrial size was normal in size.  Right Atrium: Right atrial size was normal in size.  Pericardium: There is no evidence of pericardial effusion.  Mitral Valve: The mitral valve is normal in structure. No evidence of mitral valve regurgitation. No evidence of mitral valve stenosis.  Tricuspid Valve: The tricuspid valve is normal in structure. Tricuspid valve regurgitation is not demonstrated. No evidence of tricuspid stenosis.  Aortic Valve: The aortic valve is tricuspid. Aortic valve regurgitation is not visualized. No aortic stenosis is present.  Pulmonic Valve: The pulmonic valve was normal in  structure. Pulmonic valve regurgitation is not visualized. No evidence of pulmonic stenosis.  Aorta: The aortic root is normal in size and structure.  Venous: The inferior vena cava is normal in size with greater than 50% respiratory variability, suggesting right atrial pressure of 3 mmHg.  IAS/Shunts: No atrial level shunt detected by color flow Doppler.   LEFT VENTRICLE PLAX 2D LVIDd:         4.30 cm   Diastology LVIDs:         2.90 cm   LV e' medial:    8.16 cm/s LV PW:         1.20 cm   LV E/e' medial:  8.7 LV IVS:        1.00 cm   LV e' lateral:   13.40 cm/s LVOT diam:     2.40 cm   LV E/e' lateral: 5.3 LV SV:         109 LV SV Index:   52        2D Longitudinal Strain LVOT Area:     4.52 cm  2D Strain GLS (A2C):   -22.8 % 2D Strain GLS (A3C):   -23.1 % 2D Strain GLS (A4C):   -21.1 % 2D Strain GLS Avg:     -22.4 %  3D Volume EF: 3D EF:        60 % LV EDV:       141 ml LV ESV:       57 ml LV SV:        84 ml  RIGHT VENTRICLE             IVC RV Basal diam:  4.30 cm     IVC diam: 1.50 cm RV S prime:     14.90 cm/s TAPSE (M-mode): 2.2 cm  LEFT ATRIUM             Index        RIGHT ATRIUM           Index LA diam:        4.10 cm 1.94 cm/m   RA Pressure: 3.00 mmHg LA Vol (A2C):   42.7 ml 20.21 ml/m  RA Area:     16.20 cm LA Vol (A4C):   42.5 ml 20.12 ml/m  RA Volume:   44.10 ml  20.87 ml/m LA Biplane Vol: 42.5 ml 20.12 ml/m AORTIC VALVE LVOT Vmax:   120.00 cm/s LVOT Vmean:  76.700 cm/s LVOT VTI:    0.242 m  AORTA Ao Root diam: 3.40 cm Ao Asc diam:  3.15 cm  MITRAL VALVE               TRICUSPID VALVE Estimated RAP:  3.00 mmHg MV Decel Time: 204 msec MV E velocity: 70.60 cm/s  SHUNTS MV A velocity: 56.20 cm/s  Systemic VTI:  0.24 m MV E/A ratio:  1.26  Systemic Diam: 2.40 cm  Chilton Si MD Electronically signed by Chilton Si MD Signature Date/Time: 02/20/2022/9:37:20 AM    Final    CT SCANS  CT CORONARY FRACTIONAL FLOW RESERVE DATA  PREP 02/26/2022  Narrative EXAM: CT FFR ANALYSIS  MEDICATIONS: MEDICATIONS None  FINDINGS: CT FFR analysis was performed on the original cardiac computed tomography angiogram dataset. Diagrammatic representation of the CT FFR analysis is provided in a separate PDF document in PACS. This dictation was created using the PDF document and an interactive 3D model of the results. The 3D model is not available in the EMR/PACS. Normal CT FFR range is >0.80.  1. Left Main: No significant stenosis.  2. LAD: FFR 0.79 distal LAD. 3. LCX: No significant stenosis. 4. RCA: Mid-vessel occlusion of nondominant RCA.  IMPRESSION: 1.  Occluded nondominant RCA.  2.  Extensive disease in the LAD but no discrete severe stenosis.  Marca Ancona, MD   Electronically Signed By: Marca Ancona M.D. On: 03/05/2022 16:10   CT SCANS  CT CORONARY MORPH W/CTA COR W/SCORE 02/23/2022  Addendum 03/06/2022  3:54 PM ADDENDUM REPORT: 03/06/2022 15:51  EXAM: OVER-READ INTERPRETATION  CT CHEST  The following report is an over-read performed by radiologist [facility]. [facility] on 03/06/2022. This over-read does not include interpretation of cardiac or coronary anatomy or pathology. The coronary CTA interpretation by the cardiologist is attached.  COMPARISON:  None.  FINDINGS: Normal caliber thoracic aorta.  No mediastinal or hilar mass or lymphadenopathy. The esophagus is unremarkable.  No pulmonary lesions or pulmonary nodules. Mild dependent subpleural atelectasis/edema.  The upper abdomen is grossly.  No significant bony findings.  IMPRESSION: No significant extracardiac findings.   Electronically Signed By: Rudie Meyer M.D. On: 03/06/2022 15:51  Narrative CLINICAL DATA:  Chest pain  EXAM: Cardiac CTA  MEDICATIONS: Sub lingual nitro. 4mg  x 2  TECHNIQUE: The patient was scanned on a Siemens 192 slice scanner. Gantry rotation speed was 250 msecs.  Collimation was 0.6 mm. A 100 kV prospective scan was triggered in the ascending thoracic aorta at 35-75% of the R-R interval. Average HR during the scan was 60 bpm. The 3D data set was interpreted on a dedicated work station using MPR, MIP and VRT modes. A total of 80cc of contrast was used.  FINDINGS: Non-cardiac: See separate report from [facility].  Pulmonary veins drain normally to the left atrium. No LA appendage thrombus.  Calcium Score: 283 Agatston units.  Coronary Arteries:  Codominant with no anomalies  LM: No plaque or stenosis.  LAD system: Mixed plaque in the proximal LAD, mild (25-49%) stenosis.  Circumflex system: Mixed plaque mid LCx, mild (1-24%) stenosis.  RCA system: The proximal RCA appears to be totally occluded. Relatively small, co-dominant vessel.  IMPRESSION: 1. Coronary artery calcium score 283 Agatston units. This places the patient in the 98th percentile for age and gender, suggesting high risk for future cardiac events.  2. Relatively small, co-dominant RCA appears to be occluded proximally.  3. Extensive but nonobstructive CAD in the LAD. This was confirmed by CT FFR (FFR 0.79 distal LAD).  [employer] account executive  Electronically Signed: By: Marca Ancona M.D. On: 02/23/2022 16:15         Recent Labs: 07/31/2023: ALT 26; BUN 17; Creat 1.00; Hemoglobin 16.4; Platelets 265; Potassium 4.2; Sodium 141  Recent Lipid Panel    Component Value Date/Time   CHOL 118 07/20/2022 1012   TRIG 79 07/20/2022 1012   HDL 36 (L) 07/20/2022  1012   CHOLHDL 3.3 07/20/2022 1012   LDLCALC 66 07/20/2022 1012    History of Present Illness    48 year old male with the above past medical history including CAD, RBBB, hyperlipidemia, ankylosing spondylitis, latent TB, migraines, and tobacco use.   He was initially evaluated by Dr. Flora Lipps in June 2023 in the setting of chest pain.  Coronary CT angiogram in 02/2022 revealed an occluded nondominant RCA, as  well as nonobstructive disease in the left system, CAC of 283 (98th percentile).  He was started on aspirin.  Echocardiogram showed an EF of 60 to 65%, normal LV function, no RWMA, normal RV systolic function, no significant valvular abnormalities.  He was last seen in the office on 09/05/2022 and  was stable from a cardiac standpoint.  He reported infrequent shortness of breath, chest discomfort with heavy exertion.   He presents today for follow-up.  Since his last visit he has done well from a cardiac standpoint.  He notes rare fleeting chest discomfort, he denies any significant exertional symptoms concerning for angina. He stopped taking aspirin daily for unknown reasons.  He continues to smoke. Overall, he reports feeling well.  Home Medications    Current Outpatient Medications  Medication Sig Dispense Refill   adalimumab (HUMIRA, 2 PEN,) 40 MG/0.4ML pen INJECT 40 MG UNDER THE SKIN EVERY 14 DAYS 2 each 2   ezetimibe (ZETIA) 10 MG tablet TAKE 1 TABLET BY MOUTH EVERY DAY 90 tablet 0   rosuvastatin (CRESTOR) 40 MG tablet TAKE 1 TABLET BY MOUTH EVERY DAY 90 tablet 3   tiZANidine HCl (ZANAFLEX PO) Take by mouth as needed.     traMADol (ULTRAM) 50 MG tablet Take 50 mg by mouth daily as needed.     traMADol (ULTRAM-ER) 100 MG 24 hr tablet Take 100 mg by mouth daily.     aspirin EC 81 MG tablet Take 81 mg by mouth daily. Swallow whole.     predniSONE (DELTASONE) 5 MG tablet Take 4 tabs po x 4 days, 3  tabs po x 4 days, 2  tabs po x 4 days, 1  tab po x 4 days 40 tablet 0   predniSONE (DELTASONE) 5 MG tablet Take 4 tabs po x 7 days, 3  tabs po x 7 days, 2  tabs po x 7 days, 1  tab po x 7 days 70 tablet 0   No current facility-administered medications for this visit.     Review of Systems    He denies palpitations, dyspnea, pnd, orthopnea, n, v, dizziness, syncope, edema, weight gain, or early satiety. All other systems reviewed and are otherwise negative except as noted above.   Physical Exam     VS:  BP 118/76 (BP Location: Left Leg, Patient Position: Sitting, Cuff Size: Normal)   Pulse (!) 52   Ht 5\' 9"  (1.753 m)   Wt 208 lb 9.6 oz (94.6 kg)   SpO2 96%   BMI 30.80 kg/m   GEN: Well nourished, well developed, in no acute distress. HEENT: normal. Neck: Supple, no JVD, carotid bruits, or masses. Cardiac: RRR, no murmurs, rubs, or gallops. No clubbing, cyanosis, edema.  Radials/DP/PT 2+ and equal bilaterally.  Respiratory:  Respirations regular and unlabored, clear to auscultation bilaterally. GI: Soft, nontender, nondistended, BS + x 4. MS: no deformity or atrophy. Skin: warm and dry, no rash. Neuro:  Strength and sensation are intact. Psych: Normal affect.  Accessory Clinical Findings    ECG personally reviewed by me today - EKG  Interpretation Date/Time:  Wednesday September 11 2023 07:59:02 EST Ventricular Rate:  52 PR Interval:  118 QRS Duration:  130 QT Interval:  468 QTC Calculation: 435 R Axis:   -9  Text Interpretation: Sinus bradycardia Right bundle branch block Artifact No significant change since last tracing Confirmed by Bernadene Person ([id number]) on 09/11/2023 8:19:03 AM  - no acute changes.   Lab Results  Component Value Date   WBC 8.1 07/31/2023   HGB 16.4 07/31/2023   HCT 47.9 07/31/2023   MCV 93.2 07/31/2023   PLT 265 07/31/2023   Lab Results  Component Value Date   CREATININE 1.00 07/31/2023   BUN 17 07/31/2023   NA 141 07/31/2023   K 4.2 07/31/2023   CL 105 07/31/2023   CO2 27 07/31/2023   Lab Results  Component Value Date   ALT 26 07/31/2023   AST 26 07/31/2023   ALKPHOS 98 07/20/2022   BILITOT 0.7 07/31/2023   Lab Results  Component Value Date   CHOL 118 07/20/2022   HDL 36 (L) 07/20/2022   LDLCALC 66 07/20/2022   TRIG 79 07/20/2022   CHOLHDL 3.3 07/20/2022    No results found for: "HGBA1C"  Assessment & Plan    1. CAD/dyspnea on exertion/chest pain: Coronary CT angiogram revealed an occluded nondominant RCA, as well as  nonobstructive disease in the left system, CAC of 283 (98th percentile).  He has rare fleeting chest discomfort, no significant symptoms concerning for angina. No indication for ischemic evaluation. Continue Aspirin, Crestor, Zetia.  2. Bradycardia/RBBB: EKG today shows sinus bradycardia.  He is asymptomatic, not on any AV nodal blocking agents. Stable.   3.  Hyperlipidemia: LDL was 66 in 07/2022.  Will repeat fasting lipids, LFTs. Continue Crestor, Zetia.   4. Ankylosing spondylitis: Stable. Follows with rheumatology.   5. Tobacco use: He continues to smoke.  Encouraged full cessation.   6. Disposition: Follow-up in 1 year.       Joylene Grapes, NP 09/11/2023, 8:40 AM

## 2023-10-16 ENCOUNTER — Other Ambulatory Visit (HOSPITAL_COMMUNITY): Payer: Self-pay

## 2023-10-16 NOTE — Progress Notes (Unsigned)
 Office Visit Note  Patient: Dennis Todd             Date of Birth: [birth year]           MRN: [id number]             PCP: Lauretta Grill, FNP Referring: Lauretta Grill, FNP Visit Date: 10/30/2023 Occupation: @GUAROCC @  Subjective:  Right sided lower back pain   History of Present Illness: SHAMARION COOTS is a 48 y.o. 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  Patient remains on Humira 40 mg sq injections once every 14 days.  He is tolerating humira without any side effects or injection site reactions.  He has not had any recent gaps in therapy.  He denies any signs or symptoms of a flare recently.  Patient states yesterday he injured his lower back while trying to get out of his work truck.  He did twist his back for prolonged periods of time while trying to get out of the door which aggravated his lower back around lunchtime.  Last night he had difficulty sleeping due to the discomfort.  His pain has been constant and he currently rates an 8 out of 10.  He has tried taking tramadol, using ice and heat, and tried taking methocarbamol.  His discomfort has been right-sided and he describes it as a stabbing pain. He denies any other joint pain or joint swelling at this time.  He denies any Achilles tendinitis or plantar fasciitis.  His hand pain has improved.    Activities of Daily Living:  Patient reports morning stiffness for 30 minutes.   Patient Reports nocturnal pain.  Difficulty dressing/grooming: Denies Difficulty climbing stairs: Denies Difficulty getting out of chair: Denies Difficulty using hands for taps, buttons, cutlery, and/or writing: Reports  Review of Systems  Constitutional:  Positive for fatigue.  HENT:  Negative for mouth sores, mouth dryness and nose dryness.   Eyes:  Negative for pain and dryness.  Respiratory:  Negative for shortness of breath and difficulty breathing.   Cardiovascular:  Negative for chest pain and palpitations.  Gastrointestinal:  Negative  for blood in stool, constipation and diarrhea.  Endocrine: Negative for increased urination.  Genitourinary:  Negative for involuntary urination.  Musculoskeletal:  Positive for morning stiffness. Negative for joint pain, gait problem, joint pain, joint swelling, myalgias, muscle weakness, muscle tenderness and myalgias.  Skin:  Negative for color change, rash and sensitivity to sunlight.  Allergic/Immunologic: Negative for susceptible to infections.  Neurological:  Negative for dizziness and headaches.  Hematological:  Negative for swollen glands.  Psychiatric/Behavioral:  Negative for depressed mood and sleep disturbance. The patient is not nervous/anxious.     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    Family History  Problem Relation Age of Onset   Diabetes Father    Rheum arthritis Brother    Stroke Brother    Healthy Daughter    Past Surgical History:  Procedure Laterality Date   ANKLE SURGERY Left    back ablation     back injections     NASAL SINUS SURGERY     Social History   Social History Narrative   Not on file   Immunization History  Administered Date(s) Administered   Influenza,inj,Quad PF,6+ Mos 07/21/2015   Influenza-Unspecified 09/27/2016     Objective: Vital Signs: BP 130/85 (BP Location: Left Arm, Patient Position: Sitting, Cuff Size: Normal)   Pulse (!) 59   Resp 15   Ht 5\' 9"  (1.753 m)   Wt 197 lb 9.6 oz (89.6 kg)    BMI 29.18 kg/m    Physical Exam Vitals and nursing note reviewed.  Constitutional:      Appearance: He is well-developed.  HENT:     Head: Normocephalic and atraumatic.  Eyes:     Conjunctiva/sclera: Conjunctivae normal.     Pupils: Pupils are equal, round, and reactive to light.  Cardiovascular:     Rate and Rhythm: Normal rate and regular rhythm.     Heart sounds: Normal heart sounds.  Pulmonary:     Effort: Pulmonary effort is normal.     Breath sounds: Normal breath sounds.  Abdominal:     General: Bowel sounds are normal.     Palpations: Abdomen is soft.  Musculoskeletal:     Cervical back: Normal range of motion and neck supple.  Skin:    General: Skin is warm and dry.     Capillary Refill: Capillary refill takes less than 2 seconds.  Neurological:     Mental Status: He is alert and oriented to person, place, and time.  Psychiatric:        Behavior: Behavior normal.      Musculoskeletal Exam: C-spine has good ROM.  Right paraspinal muscle tenderness in the lumbar region.  Tenderness of the right SI joint.  Midline spinal tenderness in the lumbar region.  Shoulder joints, elbow joints, wrist joints, MCPs, PIPs, and DIPs good ROM with no synovitis. PIP and DIP thickening. Complete fist formation bilaterally.  Hip joints have good ROM with no groin pain.  Knee joints have good ROM with no warmth or effusion.  Ankle joints have good ROM with no tenderness or joint swelling.   CDAI Exam: CDAI Score: -- Patient Global: --; Provider Global: -- Swollen: --; Tender: -- Joint Exam 10/30/2023   No joint exam has been documented for this visit   There is currently no information documented on the homunculus. Go to the Rheumatology activity and complete the homunculus joint exam.  Investigation: No additional findings.  Imaging: No results found.  Recent Labs: Lab Results  Component Value Date   WBC 8.1 07/31/2023   HGB 16.4 07/31/2023   PLT 265 07/31/2023   NA 141  07/31/2023   K 4.2 07/31/2023   CL 105 07/31/2023   CO2 27 07/31/2023   GLUCOSE 102 (H) 07/31/2023   BUN 17 07/31/2023   CREATININE 1.00 07/31/2023   BILITOT 0.7 09/11/2023   ALKPHOS 107 09/11/2023   AST 21 09/11/2023   ALT 22 09/11/2023   PROT 6.6 09/11/2023   ALBUMIN 4.5 09/11/2023   CALCIUM 9.6 07/31/2023   GFRAA 98 09/09/2020   QFTBGOLDPLUS POSITIVE (A) 04/19/2022    Speciality Comments: FU every 3 months  Procedures:  No procedures performed Allergies: Patient has no known allergies.   Assessment / Plan:     Visit Diagnoses: Spondyloarthritis: No signs or symptoms of a flare.  No synovitis or  dactylitis noted on exam.  No evidence of Achilles tendinitis or plantar fasciitis.  Patient mains on Humira 40 mg subcu days injections every 14 days.  He is tolerating Humira without any side effects or injection site reactions.  He has not had any recent gaps in therapy.  His hand pain has improved.  He continues to work a physically demanding job and experiences intermittent discomfort in his lower back.  He has been taking tramadol as needed for pain relief. Patient remain on Humira as prescribed.  He was advised to notify us if he develops signs or symptoms of a flare.  He will follow-up in the office in 3 months or sooner if needed.  High risk medication use - Humira 40 mg sq injections every 14 days. CBC and CMP updated on 07/31/23. Orders for CBC and CMP released today.  His next lab work will be due in June and every 3 months.  CXR updated on 08/16/23.  No recent or recurrent infections.  Discussed the importance of holding humira if he develops signs or symptoms of an infection and to resume once the infection has completely cleared.   - Plan: CBC with Differential/Platelet, COMPLETE METABOLIC PANEL WITH GFR  TB lung, latent - Completed 3 month course of isoniazid, rifampin, and pyridoxin-under care of Dr. Daiva Eves. He will require yearly CXR for screening--chest x-ray updated on  08/16/2023  HLA B27 (HLA B27 positive)  Trapezius muscle spasm: Not currently symptomatic.  Chronic right shoulder pain: Not currently symptomatic.  Good range of motion with no discomfort.  Chronic SI joint pain: Patient presents today with tenderness over the right SI joint.  Most of his discomfort is in the right paraspinal muscle in the lumbar region.  His pain is currently an 8 out of 10.  He has tried methocarbamol and tramadol with minimal to no relief.  Offered a prednisone taper but he declined. IM ventrogluteal injection performed today.   Facet arthropathy, lumbar -X-rays of the lumbar spine 05/21/17: No syndesmophytes.  Mild facet joint arthropathy noted. He takes tramadol 100 mg daily for pain relief and 50 mg as needed for breakthrough symptoms.  He presents today with an acute exacerbation of right-sided lower back pain. IM ventrogluteal depo 80 mg injection performed today.   Acute right-sided low back pain without sciatica: Patient presents today with increased right-sided lower back pain.  His symptoms started yesterday while having to twist and strain his back while trying to get out of his work truck.  According to the patient his work truck door was jammed so he was in a different position than usual trying to get the door to release which tweaked his lower back.  He is not currently experiencing any muscle spasms.  His discomfort has been constant while sitting, standing, and lying down.  He had a restless night sleep due to the discomfort.  He tried taking tramadol as well as using ice and heat with no relief.  He also tried taking methocarbamol but had no relief.  His pain is currently an 8 out of 10 and is described as a sharp stabbing pain radiating to the right buttocks.  He has some midline spinal tenderness in the lumbar region as well as ongoing tenderness in the right SI joint.  Paraspinal muscle soreness noted on the right side of the lower lumbar region. Different  treatment options were discussed today.  He declined a prednisone taper.  An IM ventrogluteal Depo-medrol 80 mg injection was performed  today (two 40 mg single use vials were used).  He tolerated the procedure well. A prescription for gabapentin 300 mg 1 capsule at bedtime was sent to the pharmacy today-dispensed 14 capsules with 0 refills.  Discussed possible side effects of gabapentin.  Patient does not plan to take methocarbamol or tizanidine going forward.  He will notify us if his symptoms persist or worsen. Administrations This Visit     methylPREDNISolone acetate (DEPO-MEDROL) injection 40 mg     Admin Date 10/30/2023 Action Given Dose 40 mg Route Intramuscular Documented By Ellen Henri, CMA    Admin Date 10/30/2023 Action Given Dose 40 mg Route Intramuscular Documented By Ellen Henri, CMA             Other medical conditions are listed as follows:   Personal history of chronic sinusitis  Vitamin D deficiency  History of migraine  Tinea versicolor    Orders: Orders Placed This Encounter  Procedures   CBC with Differential/Platelet   COMPLETE METABOLIC PANEL WITH GFR   Meds ordered this encounter  Medications   gabapentin (NEURONTIN) 300 MG capsule    Sig: Take 1 capsule (300 mg total) by mouth at bedtime.    Dispense:  14 capsule    Refill:  0   methylPREDNISolone acetate (DEPO-MEDROL) injection 40 mg   methylPREDNISolone acetate (DEPO-MEDROL) injection 40 mg     Follow-Up Instructions: Return in 3 months (on 01/30/2024) for Spondyloarthritis.   Gearldine Bienenstock, PA-C  Note - This record has been created using Dragon software.  Chart creation errors have been sought, but may not always  have been located. Such creation errors do not reflect on  the standard of medical care.

## 2023-10-18 ENCOUNTER — Other Ambulatory Visit: Payer: Self-pay

## 2023-10-18 ENCOUNTER — Other Ambulatory Visit (HOSPITAL_COMMUNITY): Payer: Self-pay

## 2023-10-18 ENCOUNTER — Other Ambulatory Visit: Payer: Self-pay | Admitting: Pharmacy Technician

## 2023-10-18 DIAGNOSIS — M461 Sacroiliitis, not elsewhere classified: Secondary | ICD-10-CM | POA: Diagnosis not present

## 2023-10-18 DIAGNOSIS — M47816 Spondylosis without myelopathy or radiculopathy, lumbar region: Secondary | ICD-10-CM | POA: Diagnosis not present

## 2023-10-18 DIAGNOSIS — M7918 Myalgia, other site: Secondary | ICD-10-CM | POA: Diagnosis not present

## 2023-10-18 DIAGNOSIS — Z79891 Long term (current) use of opiate analgesic: Secondary | ICD-10-CM | POA: Diagnosis not present

## 2023-10-18 DIAGNOSIS — M5459 Other low back pain: Secondary | ICD-10-CM | POA: Diagnosis not present

## 2023-10-18 NOTE — Progress Notes (Signed)
 Specialty Pharmacy Refill Coordination Note  Dennis Todd is a 48 y.o. male contacted today regarding refills of specialty medication(s) Adalimumab (Humira (2 Pen))   Patient requested Delivery   Delivery date: 10/23/23   Verified address: [address]  [name]   Medication will be filled on 10/22/23.

## 2023-10-30 ENCOUNTER — Encounter: Payer: Self-pay | Admitting: Physician Assistant

## 2023-10-30 ENCOUNTER — Ambulatory Visit: Payer: BC Managed Care – PPO | Attending: Physician Assistant | Admitting: Physician Assistant

## 2023-10-30 VITALS — BP 130/85 | HR 59 | Resp 15 | Ht 69.0 in | Wt 197.6 lb

## 2023-10-30 DIAGNOSIS — Z8669 Personal history of other diseases of the nervous system and sense organs: Secondary | ICD-10-CM

## 2023-10-30 DIAGNOSIS — M47816 Spondylosis without myelopathy or radiculopathy, lumbar region: Secondary | ICD-10-CM

## 2023-10-30 DIAGNOSIS — M545 Low back pain, unspecified: Secondary | ICD-10-CM

## 2023-10-30 DIAGNOSIS — Z227 Latent tuberculosis: Secondary | ICD-10-CM | POA: Diagnosis not present

## 2023-10-30 DIAGNOSIS — M533 Sacrococcygeal disorders, not elsewhere classified: Secondary | ICD-10-CM

## 2023-10-30 DIAGNOSIS — B360 Pityriasis versicolor: Secondary | ICD-10-CM

## 2023-10-30 DIAGNOSIS — Z1589 Genetic susceptibility to other disease: Secondary | ICD-10-CM

## 2023-10-30 DIAGNOSIS — Z79899 Other long term (current) drug therapy: Secondary | ICD-10-CM | POA: Diagnosis not present

## 2023-10-30 DIAGNOSIS — M62838 Other muscle spasm: Secondary | ICD-10-CM

## 2023-10-30 DIAGNOSIS — M47819 Spondylosis without myelopathy or radiculopathy, site unspecified: Secondary | ICD-10-CM

## 2023-10-30 DIAGNOSIS — Z8709 Personal history of other diseases of the respiratory system: Secondary | ICD-10-CM

## 2023-10-30 DIAGNOSIS — E559 Vitamin D deficiency, unspecified: Secondary | ICD-10-CM

## 2023-10-30 DIAGNOSIS — G8929 Other chronic pain: Secondary | ICD-10-CM

## 2023-10-30 DIAGNOSIS — M25511 Pain in right shoulder: Secondary | ICD-10-CM

## 2023-10-30 DIAGNOSIS — M51369 Other intervertebral disc degeneration, lumbar region without mention of lumbar back pain or lower extremity pain: Secondary | ICD-10-CM

## 2023-10-30 MED ORDER — METHYLPREDNISOLONE ACETATE 40 MG/ML IJ SUSP
40.0000 mg | Freq: Once | INTRAMUSCULAR | Status: AC
  Administered 2023-10-30: 40 mg via INTRAMUSCULAR

## 2023-10-30 MED ORDER — GABAPENTIN 300 MG PO CAPS: 300.0000 mg | ORAL_CAPSULE | Freq: Every evening | ORAL | 0 refills | Status: DC

## 2023-10-30 NOTE — Patient Instructions (Signed)

## 2023-10-31 LAB — COMPLETE METABOLIC PANEL WITH GFR
AG Ratio: 2.7 (calc) — ABNORMAL HIGH (ref 1.0–2.5)
ALT: 28 U/L (ref 9–46)
AST: 26 U/L (ref 10–40)
Albumin: 4.9 g/dL (ref 3.6–5.1)
Alkaline phosphatase (APISO): 74 U/L (ref 36–130)
BUN: 23 mg/dL (ref 7–25)
CO2: 28 mmol/L (ref 20–32)
Calcium: 9.6 mg/dL (ref 8.6–10.3)
Chloride: 103 mmol/L (ref 98–110)
Creat: 1.1 mg/dL (ref 0.60–1.29)
Globulin: 1.8 g/dL — ABNORMAL LOW (ref 1.9–3.7)
Glucose, Bld: 100 mg/dL — ABNORMAL HIGH (ref 65–99)
Potassium: 3.9 mmol/L (ref 3.5–5.3)
Sodium: 139 mmol/L (ref 135–146)
Total Bilirubin: 0.9 mg/dL (ref 0.2–1.2)
Total Protein: 6.7 g/dL (ref 6.1–8.1)

## 2023-10-31 LAB — CBC WITH DIFFERENTIAL/PLATELET
Absolute Lymphocytes: 2678 {cells}/uL (ref 850–3900)
Absolute Monocytes: 753 {cells}/uL (ref 200–950)
Basophils Absolute: 37 {cells}/uL (ref 0–200)
Basophils Relative: 0.4 %
Eosinophils Absolute: 233 {cells}/uL (ref 15–500)
Eosinophils Relative: 2.5 %
HCT: 46.6 % (ref 38.5–50.0)
Hemoglobin: 15.9 g/dL (ref 13.2–17.1)
MCH: 31.9 pg (ref 27.0–33.0)
MCHC: 34.1 g/dL (ref 32.0–36.0)
MCV: 93.4 fL (ref 80.0–100.0)
MPV: 10.9 fL (ref 7.5–12.5)
Monocytes Relative: 8.1 %
Neutro Abs: 5599 {cells}/uL (ref 1500–7800)
Neutrophils Relative %: 60.2 %
Platelets: 255 10*3/uL (ref 140–400)
RBC: 4.99 10*6/uL (ref 4.20–5.80)
RDW: 12.4 % (ref 11.0–15.0)
Total Lymphocyte: 28.8 %
WBC: 9.3 10*3/uL (ref 3.8–10.8)

## 2023-10-31 NOTE — Progress Notes (Signed)
 CBC WNL. Globulin is low. We will continue to monitor. Rest of CMP WNL.  Please clarify if the patient is feeling any better after the depo medrol injection yesterday?

## 2023-11-12 ENCOUNTER — Other Ambulatory Visit (HOSPITAL_COMMUNITY): Payer: Self-pay

## 2023-11-14 ENCOUNTER — Other Ambulatory Visit: Payer: Self-pay

## 2023-11-14 NOTE — Progress Notes (Signed)
 Specialty Pharmacy Refill Coordination Note  SYE SCHROEPFER is a 48 y.o. male contacted today regarding refills of specialty medication(s) Adalimumab (Humira (2 Pen))   Patient requested Delivery   Delivery date: 11/20/23   Verified address: [address]  [place] [address]   Medication will be filled on 11/19/23.

## 2023-11-19 ENCOUNTER — Other Ambulatory Visit: Payer: Self-pay

## 2023-11-21 ENCOUNTER — Other Ambulatory Visit: Payer: Self-pay | Admitting: Nurse Practitioner

## 2023-12-13 ENCOUNTER — Other Ambulatory Visit (HOSPITAL_COMMUNITY): Payer: Self-pay

## 2023-12-16 ENCOUNTER — Other Ambulatory Visit: Payer: Self-pay | Admitting: Physician Assistant

## 2023-12-16 ENCOUNTER — Other Ambulatory Visit: Payer: Self-pay

## 2023-12-16 DIAGNOSIS — M47819 Spondylosis without myelopathy or radiculopathy, site unspecified: Secondary | ICD-10-CM

## 2023-12-16 DIAGNOSIS — Z79899 Other long term (current) drug therapy: Secondary | ICD-10-CM

## 2023-12-16 MED ORDER — HUMIRA (2 PEN) 40 MG/0.4ML ~~LOC~~ AJKT
AUTO-INJECTOR | SUBCUTANEOUS | 2 refills | Status: DC
  Filled 2023-12-17: qty 2, 28d supply, fill #0
  Filled 2024-01-24: qty 2, 28d supply, fill #1
  Filled 2024-02-20: qty 2, 28d supply, fill #2

## 2023-12-16 NOTE — Telephone Encounter (Signed)
 Last Fill: 09/09/2023  Labs: 10/30/2023  CBC WNL. Globulin is low. We will continue to monitor. Rest of CMP WNL.  TB Gold: Chest x-ray 08/16/2023  Negative for acute cardiopulmonary disease   Next Visit: 01/29/2024  Last Visit: 10/30/2023  [id number]   Current Dose per office note 10/30/2023: Humira  40 mg sq injections every 14 days.   Okay to refill Humira ?

## 2023-12-16 NOTE — Progress Notes (Signed)
 Specialty Pharmacy Refill Coordination Note  Dennis Todd is a 48 y.o. male contacted today regarding refills of specialty medication(s) Adalimumab  (Humira  (2 Pen))   Patient requested Delivery   Delivery date: 12/19/23   Verified address: [address] [name] [address]   Medication will be filled on 05.07.25.   This fill date is pending response to refill request from provider. Patient is aware and if they have not received fill by intended date they must follow up with pharmacy.

## 2023-12-17 ENCOUNTER — Other Ambulatory Visit: Payer: Self-pay

## 2023-12-18 ENCOUNTER — Other Ambulatory Visit: Payer: Self-pay

## 2024-01-15 NOTE — Progress Notes (Signed)
 Office Visit Note  Patient: Dennis Todd             Date of Birth: [birth year]           MRN: [id number]             PCP: Aretta Beery, FNP Referring: Aretta Beery, FNP Visit Date: 01/29/2024 Occupation: @GUAROCC @  Subjective:  Medication monitoring   History of Present Illness: Dennis Todd is a 48 y.o. male with history spondyloarthritis.  Patient is currently prescribed  Humira  40 mg sq injections every 14 days.  He is tolerating Humira  without any side effects or injection site reactions.  He has not had any recent gaps in therapy.  Patient reports that overall his symptoms have been stable since his last office visit.  He has not had any signs or symptoms of a flare.  He experiences occasional discomfort in his hands but overall his symptoms have been more manageable.  Patient states that he notices significant improvement in his symptoms while taking gabapentin  for 2 weeks after his last office visit.  He requested a refill of gabapentin  to take at bedtime to help with his pain levels.  He denies any joint swelling at this time.  He denies any Achilles tendinitis or plantar fasciitis.  He denies any recurrent infections.  He denies any new medical conditions.   Activities of Daily Living:  Patient reports morning stiffness for 1-2 hours.   Patient Denies nocturnal pain.  Difficulty dressing/grooming: Denies Difficulty climbing stairs: Denies Difficulty getting out of chair: Denies Difficulty using hands for taps, buttons, cutlery, and/or writing: Denies  Review of Systems  Constitutional:  Negative for fatigue.  HENT:  Negative for mouth sores and mouth dryness.   Eyes:  Negative for dryness.  Respiratory:  Negative for shortness of breath.   Cardiovascular:  Negative for chest pain and palpitations.  Gastrointestinal:  Negative for blood in stool, constipation and diarrhea.  Endocrine: Negative for increased urination.  Genitourinary:  Negative for involuntary  urination.  Musculoskeletal:  Positive for morning stiffness. Negative for joint pain, gait problem, joint pain, joint swelling, myalgias, muscle weakness, muscle tenderness and myalgias.  Skin:  Negative for color change, rash, hair loss and sensitivity to sunlight.  Allergic/Immunologic: Negative for susceptible to infections.  Neurological:  Negative for dizziness and headaches.  Hematological:  Negative for swollen glands.  Psychiatric/Behavioral:  Negative for depressed mood and sleep disturbance. The patient is not nervous/anxious.     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    Family History  Problem Relation Age of Onset   Diabetes Father    Rheum arthritis Brother    Stroke Brother    Healthy Daughter    Past Surgical History:  Procedure Laterality Date   ANKLE SURGERY Left    back ablation     back injections     NASAL SINUS SURGERY     Social History  Social History Narrative   Not on file   Immunization History  Administered Date(s) Administered   Influenza,inj,Quad PF,6+ Mos 07/21/2015   Influenza-Unspecified 09/27/2016     Objective: Vital Signs: BP 119/75 (BP Location: Left Arm, Patient Position: Sitting, Cuff Size: Normal)   Pulse (!) 56   Resp 15   Ht 5' 9 (1.753 m)   Wt 200 lb (90.7 kg)   BMI 29.53 kg/m    Physical Exam Vitals and nursing note reviewed.  Constitutional:      Appearance: He is well-developed.   HENT:     Head: Normocephalic and atraumatic.   Eyes:     Conjunctiva/sclera: Conjunctivae normal.     Pupils: Pupils are equal, round, and reactive to light.    Cardiovascular:     Rate and Rhythm: Normal rate and regular rhythm.     Heart sounds: Normal heart sounds.  Pulmonary:     Effort: Pulmonary effort is normal.     Breath sounds: Normal breath sounds.  Abdominal:     General: Bowel sounds are normal.     Palpations: Abdomen is soft.   Musculoskeletal:     Cervical back: Normal range of motion and neck supple.   Skin:    General: Skin is warm and dry.     Capillary Refill: Capillary refill takes less than 2 seconds.   Neurological:     Mental Status: He is alert and oriented to person, place, and time.   Psychiatric:        Behavior: Behavior normal.      Musculoskeletal Exam: C-spine has good range of motion on examination today.  Mild midline spinal tenderness in the thoracic and lumbar region.  Mild SI joint tenderness bilaterally.  Shoulder joints, elbow joints, wrist joints, MCPs, PIPs, DIPs a good range of motion with no synovitis.  PIP and DIP thickening consistent with osteoarthritis of both hands.  Hip joints have good range of motion with no groin pain currently.  Knee joints have good range of motion with no warmth or effusion.  Ankle joints have good range of motion with no tenderness or joint swelling.  No evidence of Achilles tendinitis.  CDAI Exam: CDAI Score: -- Patient Global: --; Provider Global: -- Swollen: --; Tender: -- Joint Exam 01/29/2024   No joint exam has been documented for this visit   There is currently no information documented on the homunculus. Go to the Rheumatology activity and complete the homunculus joint exam.  Investigation: No additional findings.  Imaging: No results found.  Recent Labs: Lab Results  Component Value Date   WBC 9.3 10/30/2023   HGB 15.9 10/30/2023   PLT 255 10/30/2023   NA 139 10/30/2023   K  3.9 10/30/2023   CL 103 10/30/2023   CO2 28 10/30/2023   GLUCOSE 100 (H) 10/30/2023   BUN 23 10/30/2023   CREATININE 1.10 10/30/2023   BILITOT 0.9 10/30/2023   ALKPHOS 107 09/11/2023   AST 26 10/30/2023   ALT 28 10/30/2023   PROT 6.7 10/30/2023   ALBUMIN 4.5 09/11/2023   CALCIUM  9.6 10/30/2023   GFRAA 98 09/09/2020   QFTBGOLDPLUS POSITIVE (A) 04/19/2022    Speciality Comments: FU every 3 months  Procedures:  No procedures performed Allergies: Patient has no known allergies.   Assessment / Plan:     Visit Diagnoses: Spondyloarthritis: He has not had any signs or symptoms of a flare.  He has no synovitis or dactylitis on examination today.  No evidence of  Achilles tendinitis or plantar fasciitis.  He has mild SI joint tenderness upon palpation.  No symptoms of sciatica or radiculopathy at this time.  He is clinically doing well on Humira  40 mg subcutaneous injections every 14 days.  He is tolerating Humira  without any side effects or injection site reactions.  He has not had any recent or recurrent infections.  He had noticed a significant improvement in his symptoms while taking gabapentin  300 mg at bedtime.  A refill of gabapentin  was sent to the pharmacy today.  He was advised to notify us  if he develops any new or worsening symptoms.  He will follow-up in the office in 3 months or sooner if needed.  High risk medication use - Humira  40 mg sq injections every 14 days.  CBC and CMP updated on 10/30/23.  Orders for CBC and CMP released today.  His next lab work will be due in September and every 3 months to monitor for drug toxicity. CXR 08/16/23-Negative  No recent or recurrent infections.  Discussed the importance of holding humira  if he develops signs or symptoms of an infection and to resume once the infection has completely cleared.   - Plan: CBC with Differential/Platelet, Comprehensive metabolic panel with GFR  TB lung, latent - Completed 3 month of isoniazid , rifampin , and  pyridoxin-under care of Dr. Ernie Heal. He will require yearly CXR for screening--chest x-ray updated on 08/16/2023  HLA B27 (HLA B27 positive)  Trapezius muscle spasm: Not currently experiencing any muscle spasms.   Chronic right shoulder pain: He has good ROM of the right shoulder with no discomfort currently.   Chronic SI joint pain: Mild tenderness to palpation bilaterally-stable.  He had noticed an improvement in his symptoms while taking gabapentin  300 mg at bedtime.  A refill of gabapentin  was sent to the pharmacy today.  Acute right-sided low back pain without sciatica - Resolved.  An IM ventrogluteal Depo-medrol  80 mg injection was performed on 10/30/23--improved his symptoms.  He had noticed a significant improvement in his symptoms while taking gabapentin  300 mg at bedtime x 2 weeks.  He requested a refill of gabapentin  to be sent to the pharmacy today.  Facet arthropathy, lumbar: A refill of gabapentin  300 mg 1 capsule at bedtime as sent to the pharmacy today.  Other medical conditions are listed as follows:  Personal history of chronic sinusitis  Vitamin D  deficiency  History of migraine  Tinea versicolor  Orders: Orders Placed This Encounter  Procedures   CBC with Differential/Platelet   Comprehensive metabolic panel with GFR   Meds ordered this encounter  Medications   gabapentin  (NEURONTIN ) 300 MG capsule    Sig: Take 1 capsule (300 mg total) by mouth at bedtime.    Dispense:  90 capsule    Refill:  0    Follow-Up Instructions: Return in about 3 months (around 04/30/2024) for Spondyloarthritis .   Romayne Clubs, PA-C  Note - This record has been created using Dragon software.  Chart creation errors have been sought, but may not always  have been located. Such creation errors do not reflect on  the standard of medical care.

## 2024-01-24 ENCOUNTER — Encounter (INDEPENDENT_AMBULATORY_CARE_PROVIDER_SITE_OTHER): Payer: Self-pay

## 2024-01-24 ENCOUNTER — Other Ambulatory Visit: Payer: Self-pay

## 2024-01-24 NOTE — Progress Notes (Signed)
 Specialty Pharmacy Refill Coordination Note  Dennis Todd is a 47 y.o. male contacted today regarding refills of specialty medication(s) Adalimumab  (Humira  (2 Pen))   Patient requested Delivery   Delivery date: 01/28/24   Verified address: [address] [place]    Medication will be filled on 01/27/24.

## 2024-01-27 DIAGNOSIS — M48061 Spinal stenosis, lumbar region without neurogenic claudication: Secondary | ICD-10-CM | POA: Diagnosis not present

## 2024-01-27 DIAGNOSIS — M47816 Spondylosis without myelopathy or radiculopathy, lumbar region: Secondary | ICD-10-CM | POA: Diagnosis not present

## 2024-01-27 DIAGNOSIS — Z1330 Encounter for screening examination for mental health and behavioral disorders, unspecified: Secondary | ICD-10-CM | POA: Diagnosis not present

## 2024-01-27 DIAGNOSIS — Z79891 Long term (current) use of opiate analgesic: Secondary | ICD-10-CM | POA: Diagnosis not present

## 2024-01-28 ENCOUNTER — Other Ambulatory Visit: Payer: Self-pay

## 2024-01-29 ENCOUNTER — Ambulatory Visit: Attending: Physician Assistant | Admitting: Physician Assistant

## 2024-01-29 ENCOUNTER — Encounter: Payer: Self-pay | Admitting: Physician Assistant

## 2024-01-29 VITALS — BP 119/75 | HR 56 | Resp 15 | Ht 69.0 in | Wt 200.0 lb

## 2024-01-29 DIAGNOSIS — Z1589 Genetic susceptibility to other disease: Secondary | ICD-10-CM

## 2024-01-29 DIAGNOSIS — M545 Low back pain, unspecified: Secondary | ICD-10-CM

## 2024-01-29 DIAGNOSIS — M47819 Spondylosis without myelopathy or radiculopathy, site unspecified: Secondary | ICD-10-CM

## 2024-01-29 DIAGNOSIS — Z227 Latent tuberculosis: Secondary | ICD-10-CM | POA: Diagnosis not present

## 2024-01-29 DIAGNOSIS — M47816 Spondylosis without myelopathy or radiculopathy, lumbar region: Secondary | ICD-10-CM

## 2024-01-29 DIAGNOSIS — Z79899 Other long term (current) drug therapy: Secondary | ICD-10-CM | POA: Diagnosis not present

## 2024-01-29 DIAGNOSIS — M533 Sacrococcygeal disorders, not elsewhere classified: Secondary | ICD-10-CM

## 2024-01-29 DIAGNOSIS — Z8709 Personal history of other diseases of the respiratory system: Secondary | ICD-10-CM

## 2024-01-29 DIAGNOSIS — B360 Pityriasis versicolor: Secondary | ICD-10-CM

## 2024-01-29 DIAGNOSIS — M25511 Pain in right shoulder: Secondary | ICD-10-CM

## 2024-01-29 DIAGNOSIS — E559 Vitamin D deficiency, unspecified: Secondary | ICD-10-CM

## 2024-01-29 DIAGNOSIS — M62838 Other muscle spasm: Secondary | ICD-10-CM

## 2024-01-29 DIAGNOSIS — G8929 Other chronic pain: Secondary | ICD-10-CM

## 2024-01-29 DIAGNOSIS — Z8669 Personal history of other diseases of the nervous system and sense organs: Secondary | ICD-10-CM

## 2024-01-29 LAB — CBC WITH DIFFERENTIAL/PLATELET
Absolute Lymphocytes: 2380 {cells}/uL (ref 850–3900)
Absolute Monocytes: 774 {cells}/uL (ref 200–950)
Basophils Absolute: 43 {cells}/uL (ref 0–200)
Basophils Relative: 0.5 %
Eosinophils Absolute: 451 {cells}/uL (ref 15–500)
Eosinophils Relative: 5.3 %
HCT: 48.5 % (ref 38.5–50.0)
Hemoglobin: 16.2 g/dL (ref 13.2–17.1)
MCH: 32.3 pg (ref 27.0–33.0)
MCHC: 33.4 g/dL (ref 32.0–36.0)
MCV: 96.6 fL (ref 80.0–100.0)
MPV: 11 fL (ref 7.5–12.5)
Monocytes Relative: 9.1 %
Neutro Abs: 4854 {cells}/uL (ref 1500–7800)
Neutrophils Relative %: 57.1 %
Platelets: 245 10*3/uL (ref 140–400)
RBC: 5.02 10*6/uL (ref 4.20–5.80)
RDW: 12.6 % (ref 11.0–15.0)
Total Lymphocyte: 28 %
WBC: 8.5 10*3/uL (ref 3.8–10.8)

## 2024-01-29 LAB — COMPREHENSIVE METABOLIC PANEL WITH GFR
AG Ratio: 2.3 (calc) (ref 1.0–2.5)
ALT: 25 U/L (ref 9–46)
AST: 22 U/L (ref 10–40)
Albumin: 4.4 g/dL (ref 3.6–5.1)
Alkaline phosphatase (APISO): 73 U/L (ref 36–130)
BUN: 17 mg/dL (ref 7–25)
CO2: 29 mmol/L (ref 20–32)
Calcium: 9.2 mg/dL (ref 8.6–10.3)
Chloride: 105 mmol/L (ref 98–110)
Creat: 1.07 mg/dL (ref 0.60–1.29)
Globulin: 1.9 g/dL (ref 1.9–3.7)
Glucose, Bld: 125 mg/dL — ABNORMAL HIGH (ref 65–99)
Potassium: 4.3 mmol/L (ref 3.5–5.3)
Sodium: 141 mmol/L (ref 135–146)
Total Bilirubin: 0.6 mg/dL (ref 0.2–1.2)
Total Protein: 6.3 g/dL (ref 6.1–8.1)
eGFR: 86 mL/min/{1.73_m2} (ref >=60)

## 2024-01-29 MED ORDER — GABAPENTIN 300 MG PO CAPS: 300.0000 mg | ORAL_CAPSULE | Freq: Every evening | ORAL | 0 refills | Status: AC

## 2024-01-30 ENCOUNTER — Ambulatory Visit: Payer: Self-pay | Admitting: Physician Assistant

## 2024-01-30 NOTE — Progress Notes (Signed)
Glucose is 125.  Rest of CMP WNL. CBC WNL.

## 2024-02-02 IMAGING — DX DG CHEST 2V
2 series · 2 of 2 positions shown · non-contrast
Comparison: 01/06/2009

CLINICAL DATA: Chest pain, shortness of breath

EXAM:
CHEST - 2 VIEW

[chest pa]
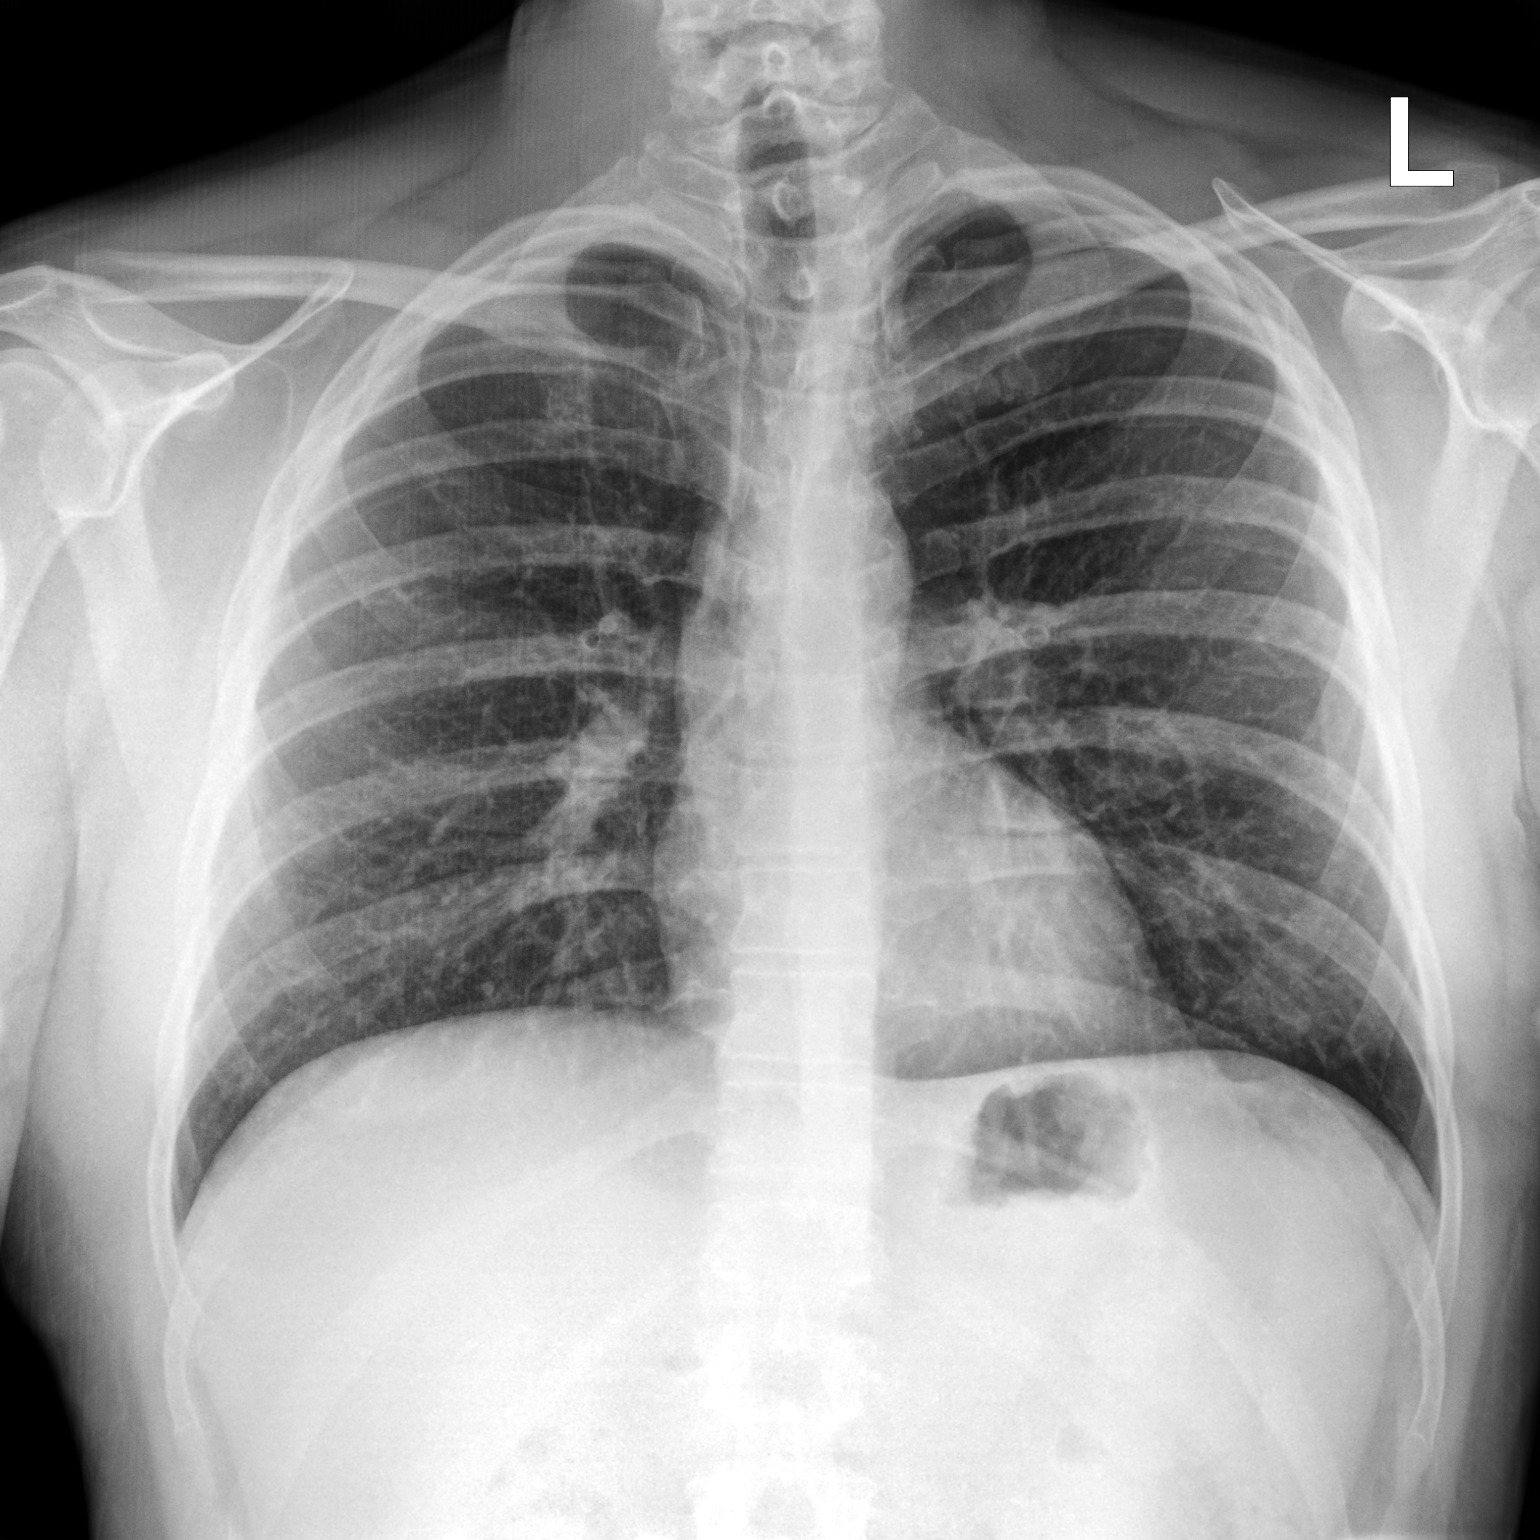

[chest lat]
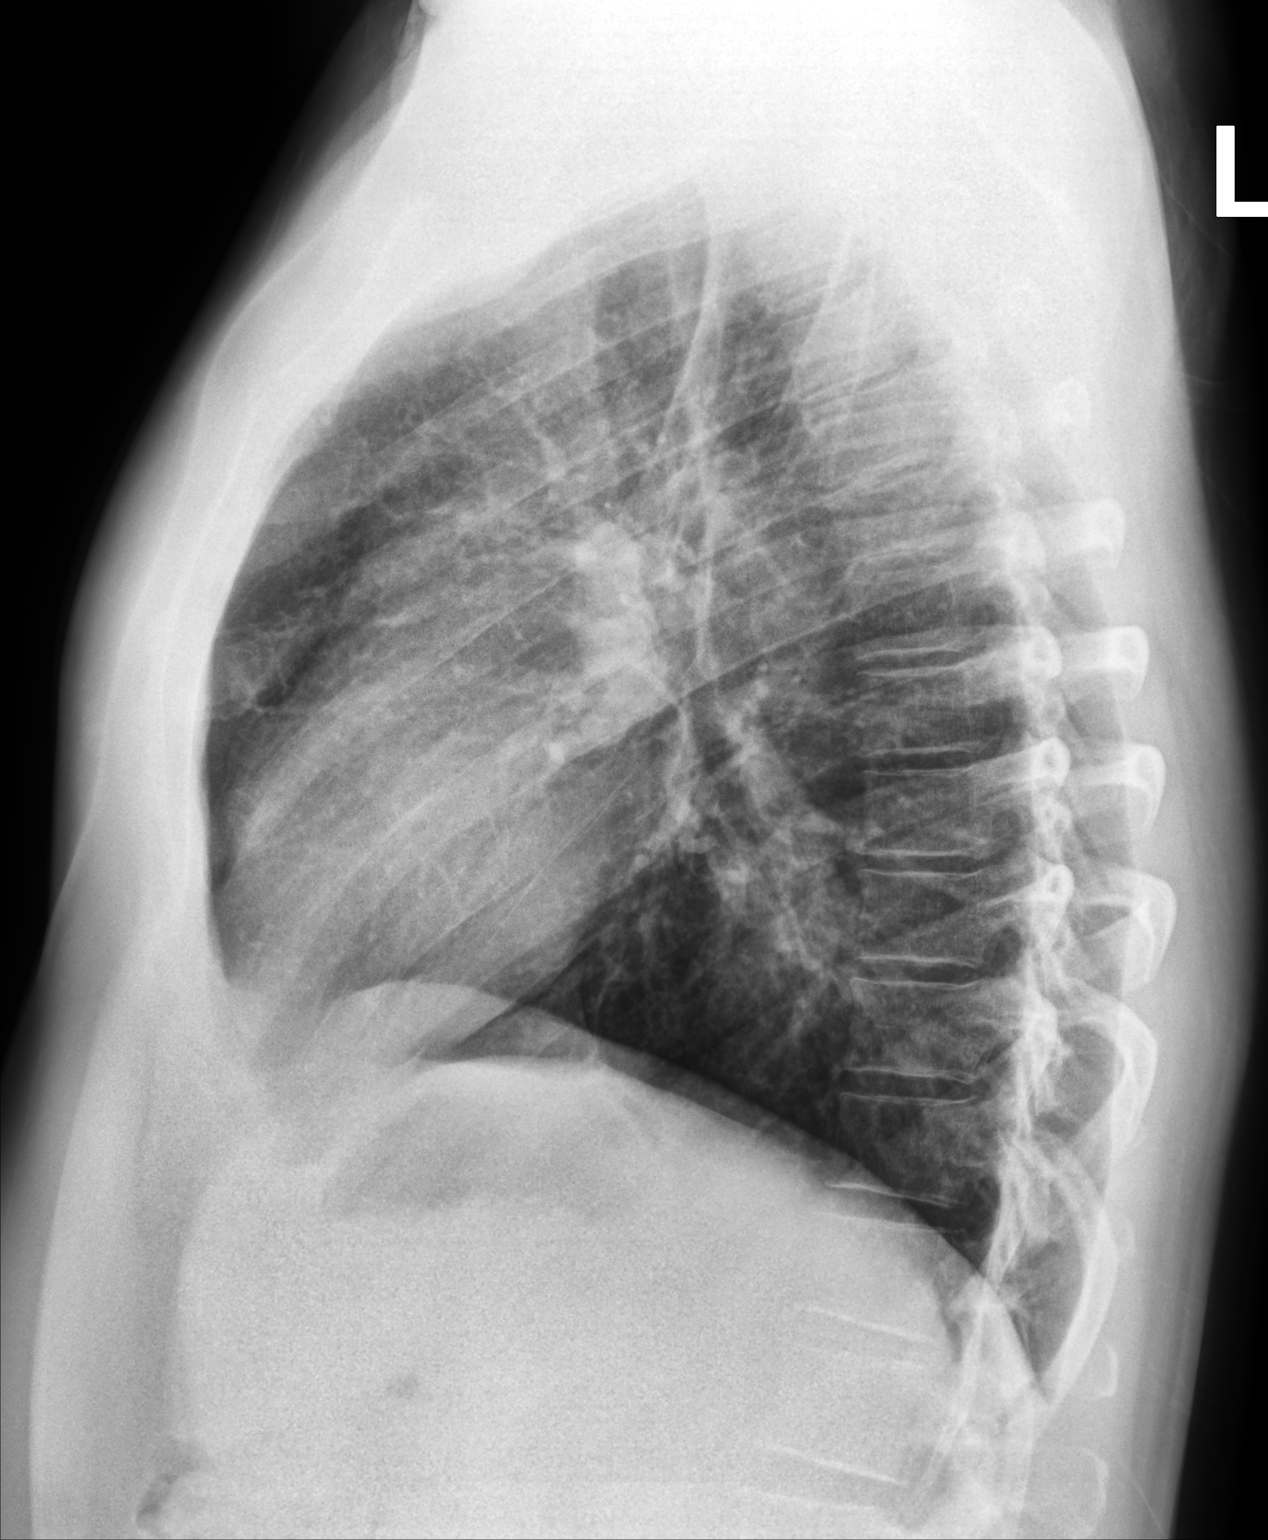

[2 of 2 positions shown; findings below may reference images not displayed]

FINDINGS: Cardiac size is within normal limits. There are no signs of
pulmonary edema or focal pulmonary consolidation. There is
peribronchial thickening. There is no pleural effusion or
pneumothorax.
IMPRESSION: Peribronchial thickening suggests bronchitis. There are no signs of
pulmonary edema or focal pulmonary consolidation.

## 2024-02-18 ENCOUNTER — Other Ambulatory Visit: Payer: Self-pay

## 2024-02-20 ENCOUNTER — Other Ambulatory Visit: Payer: Self-pay

## 2024-02-24 ENCOUNTER — Other Ambulatory Visit: Payer: Self-pay

## 2024-02-24 NOTE — Progress Notes (Signed)
 Specialty Pharmacy Refill Coordination Note  Dennis Todd is a 48 y.o. male contacted today regarding refills of specialty medication(s) Adalimumab  (Humira  (2 Pen))   Patient requested Delivery   Delivery date: 02/26/24   Verified address: [address] [name], [place]   Medication will be filled on 02/25/24.

## 2024-03-17 ENCOUNTER — Other Ambulatory Visit (HOSPITAL_COMMUNITY): Payer: Self-pay

## 2024-03-17 ENCOUNTER — Other Ambulatory Visit: Payer: Self-pay | Admitting: Rheumatology

## 2024-03-17 ENCOUNTER — Other Ambulatory Visit: Payer: Self-pay

## 2024-03-17 DIAGNOSIS — M47819 Spondylosis without myelopathy or radiculopathy, site unspecified: Secondary | ICD-10-CM

## 2024-03-17 DIAGNOSIS — Z79899 Other long term (current) drug therapy: Secondary | ICD-10-CM

## 2024-03-17 MED ORDER — HUMIRA (2 PEN) 40 MG/0.4ML ~~LOC~~ AJKT
AUTO-INJECTOR | SUBCUTANEOUS | 2 refills | Status: DC
  Filled 2024-03-17: qty 2, fill #0
  Filled 2024-03-19: qty 2, 28d supply, fill #0
  Filled 2024-04-16: qty 2, 28d supply, fill #1
  Filled 2024-05-14: qty 2, 28d supply, fill #2

## 2024-03-17 NOTE — Telephone Encounter (Signed)
 Last Fill: 12/16/2023  Labs: 01/29/2024 Glucose is 125. Rest of CMP WNL. CBC WNL  TB Gold: 08/16/2023 Chest x-ray is normal.  Next Visit: 04/29/2024  Last Visit: 01/29/2024  DX: Spondyloarthritis  Current Dose per office note 01/29/2024:  Humira  40 mg sq injections every 14 days.  Okay to refill Humira ?

## 2024-03-19 ENCOUNTER — Other Ambulatory Visit: Payer: Self-pay

## 2024-03-19 NOTE — Progress Notes (Signed)
 Specialty Pharmacy Refill Coordination Note  Dennis Todd is a 48 y.o. male contacted today regarding refills of specialty medication(s) Adalimumab  (Humira  (2 Pen))   Patient requested Delivery   Delivery date: 03/25/24   Verified address: [address] [name], [place]   Medication will be filled on 03/24/24.

## 2024-04-15 NOTE — Progress Notes (Unsigned)
 Office Visit Note  Patient: Dennis Todd             Date of Birth: Jan 02, 1976           MRN: [id number]             PCP: Geroge Maxwell, FNP Referring: Geroge Maxwell, FNP Visit Date: 04/29/2024 Occupation: @GUAROCC @  Subjective:  Lower back pain   History of Present Illness: Dennis Todd is a 48 y.o. 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  Patient remains on Humira  40 mg sq injections every 14 days.  He is tolerating Humira  without any side effects and has not had any gaps in therapy.  Patient states that last week he injured his back at work and was evaluated urgent care.  He was given a prednisone  taper and a prescription for Flexeril which helped to alleviate his symptoms.  He is no longer taking gabapentin  due to it interfering with his sleep.  He has been taking tramadol as needed for pain relief.  Patient denies any signs or symptoms of a flare recently.  His hand pain has been tolerable.  He denies any Achilles tendinitis or plantar fasciitis.  He denies any joint swelling.  He denies any signs or symptoms of uveitis.  He denies any new medical conditions.  He denies any recent or recurrent infections.    Activities of Daily Living:  Patient reports morning stiffness for 1 hour.   Patient Denies nocturnal pain.  Difficulty dressing/grooming: Denies Difficulty climbing stairs: Denies Difficulty getting out of chair: Denies Difficulty using hands for taps, buttons, cutlery, and/or writing: Reports  Review of Systems  Constitutional:  Negative for fatigue.  HENT:  Negative for mouth sores and mouth dryness.   Eyes:  Negative for dryness.  Respiratory:  Negative for shortness of breath.   Cardiovascular:  Negative for chest pain and palpitations.  Gastrointestinal:  Negative for blood in stool, constipation and diarrhea.  Endocrine: Negative for increased urination.  Genitourinary:  Negative for involuntary urination.  Musculoskeletal:  Positive for joint pain,  joint pain, myalgias, morning stiffness and myalgias. Negative for gait problem, joint swelling, muscle weakness and muscle tenderness.  Skin:  Negative for color change, rash, hair loss and sensitivity to sunlight.  Allergic/Immunologic: Negative for susceptible to infections.  Neurological:  Negative for dizziness and headaches.  Hematological:  Negative for swollen glands.  Psychiatric/Behavioral:  Negative for depressed mood and sleep disturbance. The patient is not nervous/anxious.     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    Family History  Problem Relation Age of Onset   Diabetes Father    Rheum arthritis Brother    Stroke Brother    Healthy Daughter    Past Surgical History:  Procedure Laterality Date   ANKLE SURGERY Left    back ablation     back injections     NASAL SINUS SURGERY     Social History  Social History Narrative   Not on file   Immunization History  Administered Date(s) Administered   Influenza,inj,Quad PF,6+ Mos 07/21/2015   Influenza-Unspecified 09/27/2016     Objective: Vital Signs: BP 123/78   Pulse (!) 53   Temp (!) 97.5 F (36.4 C)   Resp 14   Ht 5' 9 (1.753 m)   Wt 201 lb 9.6 oz (91.4 kg)   BMI 29.77 kg/m    Physical Exam Vitals and nursing note reviewed.  Constitutional:      Appearance: He is well-developed.  HENT:     Head: Normocephalic and atraumatic.  Eyes:     Conjunctiva/sclera:  Conjunctivae normal.     Pupils: Pupils are equal, round, and reactive to light.  Cardiovascular:     Rate and Rhythm: Normal rate and regular rhythm.     Heart sounds: Normal heart sounds.  Pulmonary:     Effort: Pulmonary effort is normal.     Breath sounds: Normal breath sounds.  Abdominal:     General: Bowel sounds are normal.     Palpations: Abdomen is soft.  Musculoskeletal:     Cervical back: Normal range of motion and neck supple.  Skin:    General: Skin is warm and dry.     Capillary Refill: Capillary refill takes less than 2 seconds.  Neurological:     Mental Status: He is alert and oriented to person, place, and time.  Psychiatric:        Behavior: Behavior normal.      Musculoskeletal Exam: C-spine, thoracic spine, lumbar spine have good range of motion.  Mild midline spinal tenderness in the lumbar region.  No SI joint tenderness.  Shoulder joints, elbow joints, wrist joints, MCPs, PIPs, DIPs have good range of motion with no synovitis.  Complete fist formation bilaterally.  PIP and DIP thickening consistent with osteoarthritis of both hands. Hip joints have good range of motion with no groin pain.  Knee joints have good range of motion no warmth or effusion.  Ankle joints have good range of motion no tenderness or joint swelling.  No evidence of Achilles tendinitis or plantar fasciitis.   CDAI Exam: CDAI Score: -- Patient Global: --; Provider Global: -- Swollen: --; Tender: -- Joint Exam 04/29/2024   No joint exam has been documented for this visit   There is currently no information documented on the homunculus. Go to the Rheumatology activity and complete the homunculus joint exam.  Investigation: No additional findings.  Imaging: No results found.  Recent Labs: Lab Results  Component Value Date   WBC 8.5 01/29/2024   HGB 16.2 01/29/2024   PLT 245 01/29/2024   NA 141 01/29/2024   K 4.3 01/29/2024   CL 105 01/29/2024   CO2 29 01/29/2024   GLUCOSE 125  (H) 01/29/2024   BUN 17 01/29/2024   CREATININE 1.07 01/29/2024   BILITOT 0.6 01/29/2024   ALKPHOS 107 09/11/2023   AST 22 01/29/2024   ALT 25 01/29/2024   PROT 6.3 01/29/2024   ALBUMIN 4.5 09/11/2023   CALCIUM  9.2 01/29/2024   GFRAA 98 09/09/2020   QFTBGOLDPLUS POSITIVE (A) 04/19/2022    Speciality Comments: FU every 3 months  Procedures:  No procedures performed Allergies: Patient has no known allergies.    Assessment / Plan:     Visit Diagnoses: Spondyloarthritis: HLA-B27+: He has not had any signs or symptoms of a flare.  No synovitis or dactylitis noted.  No evidence of Achilles tendinitis or plantar fasciitis.  No signs or symptoms of uveitis.  He remains on Humira  40 mg subcu days injections once every 14 days.  He is tolerating Humira  without any side effects or injection site reactions.  He has not had any recent gaps in therapy.  No recent or recurrent infections.  No medication changes will be made at this time.  He was advised to notify us  if he develops signs or symptoms of a flare.  He will follow-up in the office in 3 months or sooner if needed.  High risk medication use - Humira  40 mg sq injections every 14 days.  CBC and CMP updated on 01/29/24. Orders for CBC and CMP released today.  CXR 08/16/23. CXR required yearly due to history of latent TB.   No recent or recurrent infections. Discussed the importance of holding humira  if he develops signs or symptoms of an infection and to resume once the infection has completely cleared.   - Plan: CBC with Differential/Platelet, Comprehensive metabolic panel with GFR  TB lung, latent - Completed 3 month of isoniazid , rifampin , and pyridoxin-under care of Dr. Fleeta Rothman. He will require yearly CXR for screening--chest x-ray updated on 08/16/2023.   HLA B27 (HLA B27 positive)  Trapezius muscle spasm: Not currently symptomatic.  Chronic SI joint pain: Chronic pain.  No new or worsening symptoms.  He takes tramadol as needed for pain  relief.    Acute midline low back pain without sciatica: Patient injured his lower back while at work last week.  He was evaluated urgent care and was prescribed a prednisone  taper and a course of Flexeril.  His symptoms have started to subside but he has continued to wear his back brace at work which has been helpful.  He is not experiencing any symptoms of sciatica at this time.  Facet arthropathy, lumbar -Intermittent discomfort.  No symptoms of radiculopathy.  Discontinued gabapentin  due to SE. He takes tramadol as needed for pain relief.    Other medical conditions are listed as follows:   Personal history of chronic sinusitis  Vitamin D  deficiency  History of migraine  Tinea versicolor  Orders: Orders Placed This Encounter  Procedures   CBC with Differential/Platelet   Comprehensive metabolic panel with GFR   No orders of the defined types were placed in this encounter.   Follow-Up Instructions: Return in about 3 months (around 07/29/2024) for Spondyloarthritis.   Waddell CHRISTELLA Craze, PA-C  Note - This record has been created using Dragon software.  Chart creation errors have been sought, but may not always  have been located. Such creation errors do not reflect on  the standard of medical care.

## 2024-04-16 ENCOUNTER — Other Ambulatory Visit: Payer: Self-pay

## 2024-04-16 NOTE — Progress Notes (Signed)
 Specialty Pharmacy Refill Coordination Note  Dennis Todd is a 48 y.o. male contacted today regarding refills of specialty medication(s) Adalimumab  (Humira  (2 Pen))   Patient requested Delivery   Delivery date: 04/28/24   Verified address: [address] [name], [place]   Medication will be filled on 04/27/24.

## 2024-04-17 DIAGNOSIS — M545 Low back pain, unspecified: Secondary | ICD-10-CM | POA: Diagnosis not present

## 2024-04-17 DIAGNOSIS — R252 Cramp and spasm: Secondary | ICD-10-CM | POA: Diagnosis not present

## 2024-04-21 ENCOUNTER — Other Ambulatory Visit: Payer: Self-pay | Admitting: Cardiovascular Disease

## 2024-04-27 ENCOUNTER — Other Ambulatory Visit: Payer: Self-pay

## 2024-04-29 ENCOUNTER — Encounter: Payer: Self-pay | Admitting: Physician Assistant

## 2024-04-29 ENCOUNTER — Ambulatory Visit: Attending: Physician Assistant | Admitting: Physician Assistant

## 2024-04-29 VITALS — BP 123/78 | HR 53 | Temp 97.5°F | Resp 14 | Ht 69.0 in | Wt 201.6 lb

## 2024-04-29 DIAGNOSIS — M47819 Spondylosis without myelopathy or radiculopathy, site unspecified: Secondary | ICD-10-CM | POA: Diagnosis not present

## 2024-04-29 DIAGNOSIS — Z1589 Genetic susceptibility to other disease: Secondary | ICD-10-CM

## 2024-04-29 DIAGNOSIS — B360 Pityriasis versicolor: Secondary | ICD-10-CM

## 2024-04-29 DIAGNOSIS — Z8669 Personal history of other diseases of the nervous system and sense organs: Secondary | ICD-10-CM

## 2024-04-29 DIAGNOSIS — M545 Low back pain, unspecified: Secondary | ICD-10-CM

## 2024-04-29 DIAGNOSIS — Z8709 Personal history of other diseases of the respiratory system: Secondary | ICD-10-CM

## 2024-04-29 DIAGNOSIS — G8929 Other chronic pain: Secondary | ICD-10-CM

## 2024-04-29 DIAGNOSIS — Z79899 Other long term (current) drug therapy: Secondary | ICD-10-CM

## 2024-04-29 DIAGNOSIS — M47816 Spondylosis without myelopathy or radiculopathy, lumbar region: Secondary | ICD-10-CM

## 2024-04-29 DIAGNOSIS — Z227 Latent tuberculosis: Secondary | ICD-10-CM | POA: Diagnosis not present

## 2024-04-29 DIAGNOSIS — E559 Vitamin D deficiency, unspecified: Secondary | ICD-10-CM

## 2024-04-29 DIAGNOSIS — M62838 Other muscle spasm: Secondary | ICD-10-CM

## 2024-04-29 DIAGNOSIS — M533 Sacrococcygeal disorders, not elsewhere classified: Secondary | ICD-10-CM

## 2024-04-29 DIAGNOSIS — M25511 Pain in right shoulder: Secondary | ICD-10-CM

## 2024-04-30 ENCOUNTER — Ambulatory Visit: Payer: Self-pay | Admitting: Physician Assistant

## 2024-04-30 LAB — CBC WITH DIFFERENTIAL/PLATELET
Absolute Lymphocytes: 2870 {cells}/uL (ref 850–3900)
Absolute Monocytes: 801 {cells}/uL (ref 200–950)
Basophils Absolute: 52 {cells}/uL (ref 0–200)
Basophils Relative: 0.5 %
Eosinophils Absolute: 406 {cells}/uL (ref 15–500)
Eosinophils Relative: 3.9 %
HCT: 52.3 % — ABNORMAL HIGH (ref 38.5–50.0)
Hemoglobin: 17.4 g/dL — ABNORMAL HIGH (ref 13.2–17.1)
MCH: 31.7 pg (ref 27.0–33.0)
MCHC: 33.3 g/dL (ref 32.0–36.0)
MCV: 95.3 fL (ref 80.0–100.0)
MPV: 10.9 fL (ref 7.5–12.5)
Monocytes Relative: 7.7 %
Neutro Abs: 6271 {cells}/uL (ref 1500–7800)
Neutrophils Relative %: 60.3 %
Platelets: 233 Thousand/uL (ref 140–400)
RBC: 5.49 Million/uL (ref 4.20–5.80)
RDW: 12.4 % (ref 11.0–15.0)
Total Lymphocyte: 27.6 %
WBC: 10.4 Thousand/uL (ref 3.8–10.8)

## 2024-04-30 LAB — COMPREHENSIVE METABOLIC PANEL WITH GFR
AG Ratio: 1.9 (calc) (ref 1.0–2.5)
ALT: 36 U/L (ref 9–46)
AST: 23 U/L (ref 10–40)
Albumin: 4.4 g/dL (ref 3.6–5.1)
Alkaline phosphatase (APISO): 79 U/L (ref 36–130)
BUN: 18 mg/dL (ref 7–25)
CO2: 30 mmol/L (ref 20–32)
Calcium: 9.3 mg/dL (ref 8.6–10.3)
Chloride: 102 mmol/L (ref 98–110)
Creat: 1.04 mg/dL (ref 0.60–1.29)
Globulin: 2.3 g/dL (ref 1.9–3.7)
Glucose, Bld: 88 mg/dL (ref 65–99)
Potassium: 4.1 mmol/L (ref 3.5–5.3)
Sodium: 140 mmol/L (ref 135–146)
Total Bilirubin: 0.6 mg/dL (ref 0.2–1.2)
Total Protein: 6.7 g/dL (ref 6.1–8.1)
eGFR: 89 mL/min/1.73m2 (ref >=60)

## 2024-04-30 NOTE — Progress Notes (Signed)
 CMP WNL Hgb and hct are borderline elevated, rest of CBC WNL.  We will continue to monitor.

## 2024-05-14 ENCOUNTER — Other Ambulatory Visit: Payer: Self-pay

## 2024-05-14 NOTE — Progress Notes (Signed)
 Specialty Pharmacy Ongoing Clinical Assessment Note  Dennis Todd is a 49 y.o. male who is being followed by the specialty pharmacy service for RxSp Ankylosing Spondylitis   Patient's specialty medication(s) reviewed today: Adalimumab  (Humira  (2 Pen))   Missed doses in the last 4 weeks: 0   Patient/Caregiver did not have any additional questions or concerns.   Therapeutic benefit summary: Patient is achieving benefit   Adverse events/side effects summary: No adverse events/side effects   Patient's therapy is appropriate to: Continue    Goals Addressed             This Visit's Progress    Minimize recurrence of flares   On track    Patient is on track. Patient will maintain adherence         Follow up: 12 months  Any Mcneice M Tashunda Vandezande Specialty Pharmacist

## 2024-05-14 NOTE — Progress Notes (Signed)
 Specialty Pharmacy Refill Coordination Note  KORON GODEAUX is a 48 y.o. male contacted today regarding refills of specialty medication(s) Adalimumab  (Humira  (2 Pen))   Patient requested Delivery   Delivery date: 05/21/24   Verified address: [address] [name],    Medication will be filled on 05/20/24.

## 2024-05-18 DIAGNOSIS — Z79891 Long term (current) use of opiate analgesic: Secondary | ICD-10-CM | POA: Diagnosis not present

## 2024-05-18 DIAGNOSIS — M461 Sacroiliitis, not elsewhere classified: Secondary | ICD-10-CM | POA: Diagnosis not present

## 2024-05-18 DIAGNOSIS — M5136 Other intervertebral disc degeneration, lumbar region with discogenic back pain only: Secondary | ICD-10-CM | POA: Diagnosis not present

## 2024-05-18 DIAGNOSIS — M47816 Spondylosis without myelopathy or radiculopathy, lumbar region: Secondary | ICD-10-CM | POA: Diagnosis not present

## 2024-05-20 ENCOUNTER — Other Ambulatory Visit: Payer: Self-pay

## 2024-06-01 DIAGNOSIS — D84821 Immunodeficiency due to drugs: Secondary | ICD-10-CM | POA: Diagnosis not present

## 2024-06-01 DIAGNOSIS — J988 Other specified respiratory disorders: Secondary | ICD-10-CM | POA: Diagnosis not present

## 2024-06-01 DIAGNOSIS — R52 Pain, unspecified: Secondary | ICD-10-CM | POA: Diagnosis not present

## 2024-06-01 DIAGNOSIS — M0579 Rheumatoid arthritis with rheumatoid factor of multiple sites without organ or systems involvement: Secondary | ICD-10-CM | POA: Diagnosis not present

## 2024-06-15 ENCOUNTER — Other Ambulatory Visit: Payer: Self-pay

## 2024-06-15 ENCOUNTER — Other Ambulatory Visit: Payer: Self-pay | Admitting: Physician Assistant

## 2024-06-15 ENCOUNTER — Other Ambulatory Visit (HOSPITAL_COMMUNITY): Payer: Self-pay

## 2024-06-15 DIAGNOSIS — M47819 Spondylosis without myelopathy or radiculopathy, site unspecified: Secondary | ICD-10-CM

## 2024-06-15 DIAGNOSIS — Z79899 Other long term (current) drug therapy: Secondary | ICD-10-CM

## 2024-06-15 MED ORDER — HUMIRA (2 PEN) 40 MG/0.4ML ~~LOC~~ AJKT
AUTO-INJECTOR | SUBCUTANEOUS | 2 refills | Status: DC
  Filled 2024-06-15: qty 2, fill #0
  Filled 2024-06-17: qty 2, 28d supply, fill #0
  Filled 2024-07-13: qty 2, 28d supply, fill #1
  Filled 2024-08-19 – 2024-08-21 (×2): qty 2, 28d supply, fill #2

## 2024-06-15 NOTE — Telephone Encounter (Signed)
 Last Fill: 03/17/2024  Labs: 04/29/2024 CMP WNL Hgb and hct are borderline elevated, rest of CBC WNL.  We will continue to monitor.   TB Gold: 08/16/2023 Chest x-ray is normal.   Next Visit: 07/29/2024  Last Visit: 04/29/2024  IK:[id number]   Current Dose per office note 04/29/2024: Humira  40 mg sq injections every 14 days.   Okay to refill Humira ?

## 2024-06-17 ENCOUNTER — Other Ambulatory Visit (HOSPITAL_COMMUNITY): Payer: Self-pay

## 2024-06-17 ENCOUNTER — Other Ambulatory Visit: Payer: Self-pay

## 2024-06-18 ENCOUNTER — Other Ambulatory Visit: Payer: Self-pay

## 2024-06-18 NOTE — Progress Notes (Signed)
 Specialty Pharmacy Refill Coordination Note  Dennis Todd is a 48 y.o. male contacted today regarding refills of specialty medication(s) Adalimumab  (Humira  (2 Pen))   Patient requested Delivery   Delivery date: 06/23/24   Verified address: [address] [name], [place]   Medication will be filled on: 06/22/24

## 2024-06-22 ENCOUNTER — Other Ambulatory Visit: Payer: Self-pay

## 2024-07-01 ENCOUNTER — Other Ambulatory Visit (HOSPITAL_COMMUNITY): Payer: Self-pay

## 2024-07-01 ENCOUNTER — Other Ambulatory Visit: Payer: Self-pay

## 2024-07-13 ENCOUNTER — Other Ambulatory Visit: Payer: Self-pay

## 2024-07-13 NOTE — Progress Notes (Signed)
 Specialty Pharmacy Refill Coordination Note  Dennis Todd is a 48 y.o. male contacted today regarding refills of specialty medication(s) Adalimumab  (Humira  (2 Pen))   Patient requested Delivery   Delivery date: 07/24/24   Verified address: [address] [name], [place]   Medication will be filled on: 07/23/24

## 2024-07-16 ENCOUNTER — Other Ambulatory Visit: Payer: Self-pay

## 2024-07-16 NOTE — Progress Notes (Unsigned)
 Office Visit Note  Patient: Dennis Todd             Date of Birth: 01-Apr-1976           MRN: [id number]             PCP: Geroge Maxwell, FNP Referring: Geroge Maxwell, FNP Visit Date: 07/29/2024 Occupation: Data Unavailable  Subjective:  Medication monitoring   History of Present Illness: Dennis Todd is a 48 y.o. 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  Patient remains on  Humira  40 mg sq injections every 14 days.  He is tolerating marrow without any side effects and has not had any recent gaps in therapy.  He denies any signs or symptoms of a flare.  His morning stiffness has been lasting for about 1 hour daily.  He has not had any increased nocturnal pain recently.  His spinal pain has been manageable overall.  He experiences intermittent discomfort in his lower back which he attributes to work-related discomfort.  Patient states for the past 2 months he has noticed intermittent sharp pain in the right hip at the hip flexor insertion site.  He denies any groin pain.  He denies any buckling, locking, or clicking in the right hip.  He is not currently experiencing any discomfort.  He denies any injury or fall prior to the onset of symptoms. He experiences some difficulty with fine motor skills especially with repetitive or overuse activities.  He denies any joint swelling.  He denies any Achilles tendinitis or plantar fasciitis.  He denies any recent rashes.  He denies any new medical conditions.  He has not had any recent or recurrent infections.    Activities of Daily Living:  Patient reports morning stiffness for 1 hour  Patient Denies nocturnal pain.  Difficulty dressing/grooming: Denies Difficulty climbing stairs: Denies Difficulty getting out of chair: Reports Difficulty using hands for taps, buttons, cutlery, and/or writing: Denies  Review of Systems  Constitutional:  Positive for fatigue. Negative for night sweats.  HENT:  Negative for mouth sores, mouth dryness and  nose dryness.   Eyes:  Negative for redness and dryness.  Respiratory:  Negative for cough, shortness of breath and difficulty breathing.   Cardiovascular:  Negative for chest pain, palpitations, hypertension, irregular heartbeat and swelling in legs/feet.  Gastrointestinal:  Negative for constipation and diarrhea.  Endocrine: Negative for increased urination.  Genitourinary:  Negative for painful urination.  Musculoskeletal:  Positive for joint pain, joint pain and morning stiffness. Negative for joint swelling, myalgias, muscle weakness, muscle tenderness and myalgias.  Skin:  Negative for color change, rash, hair loss, nodules/bumps, skin tightness, ulcers and sensitivity to sunlight.  Allergic/Immunologic: Negative for susceptible to infections.  Neurological:  Negative for dizziness, fainting, memory loss, night sweats and weakness.  Hematological:  Negative for swollen glands.  Psychiatric/Behavioral:  Negative for depressed mood and sleep disturbance. The patient is not nervous/anxious.     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    Family History  Problem Relation Age of Onset   Diabetes Father    Rheum arthritis Brother    Stroke Brother    Healthy Daughter    Past Surgical History:  Procedure Laterality Date   ANKLE SURGERY Left    back ablation     back injections     NASAL SINUS SURGERY     Social History   Tobacco Use   Smoking status:  Former    Current packs/day: 0.00    Average packs/day: 1 pack/day for 10.0 years (10.0 ttl pk-yrs)    Types: Cigarettes    Start date: 08/13/1996    Quit date: 08/13/2006    Years since quitting: 17.9    Passive exposure: Current   Smokeless tobacco: Former    Types: Snuff    Quit date: 08/13/2006   Tobacco comments:    Patient stated he smokes on occasions.  Vaping Use   Vaping status: Never Used  Substance Use Topics   Alcohol use: No   Drug use: Never   Social History   Social History Narrative   Not on file     Immunization History  Administered Date(s) Administered   Influenza,inj,Quad PF,6+ Mos 07/21/2015   Influenza-Unspecified 09/27/2016     Objective: Vital Signs: Ht 5' 9 (1.753 m)   BMI 29.77 kg/m    Physical Exam Vitals and nursing note reviewed.  Constitutional:      Appearance: He is well-developed.  HENT:     Head: Normocephalic and atraumatic.  Eyes:     Conjunctiva/sclera: Conjunctivae normal.     Pupils: Pupils are equal, round, and reactive to light.  Cardiovascular:     Rate and Rhythm: Normal rate and regular rhythm.     Heart sounds: Normal heart sounds.  Pulmonary:     Effort: Pulmonary effort is normal.     Breath sounds: Normal breath sounds.  Abdominal:     General: Bowel sounds are normal.     Palpations: Abdomen is soft.  Musculoskeletal:     Cervical back: Normal range of motion and neck supple.  Skin:    General: Skin is warm and dry.     Capillary Refill: Capillary refill takes less than 2 seconds.  Neurological:     Mental Status: He is alert and oriented to person, place, and time.  Psychiatric:        Behavior: Behavior normal.      Musculoskeletal Exam: C-spine, thoracic spine, lumbar spine have good range of motion.  Mild midline spinal tenderness.  No SI joint tenderness.  Shoulder joints, elbow joints, wrist joints, MCPs, PIPs, DIPs have good range of motion with no synovitis. PIP and DIP thickening consistent with  osteoarthritis of both hands. Complete fist formation bilaterally.  Hip joints have good range of motion with no groin pain. Tenderness at the right hip flexor insertion site. Knee joints have good range of motion no warmth or effusion.  Ankle joints have good range of motion no tenderness or joint swelling.  No evidence of Achilles tendinitis or plantar fasciitis.   CDAI Exam: CDAI Score: -- Patient Global: --; Provider Global: -- Swollen: --; Tender: -- Joint Exam 07/29/2024   No joint exam has been documented for this visit   There is currently no information documented on the homunculus. Go to the Rheumatology activity and complete the homunculus joint exam.  Investigation: No additional findings.  Imaging: No results found.  Recent Labs: Lab  Results  Component Value Date   WBC 10.4 04/29/2024   HGB 17.4 (H) 04/29/2024   PLT 233 04/29/2024   NA 140 04/29/2024   K 4.1 04/29/2024   CL 102 04/29/2024   CO2 30 04/29/2024   GLUCOSE 88 04/29/2024   BUN 18 04/29/2024   CREATININE 1.04 04/29/2024   BILITOT 0.6 04/29/2024   ALKPHOS 107 09/11/2023   AST 23 04/29/2024   ALT 36 04/29/2024   PROT 6.7 04/29/2024   ALBUMIN 4.5 09/11/2023   CALCIUM  9.3 04/29/2024   GFRAA 98 09/09/2020   QFTBGOLDPLUS POSITIVE (A) 04/19/2022    Speciality Comments: FU every 3 months  Procedures:  No procedures performed Allergies: Patient has no known allergies.    Assessment / Plan:     Visit Diagnoses: Spondyloarthritis - HLA-B27+: He has not had any signs or symptoms of a flare.  He continues to experience intermittent arthralgias and joint stiffness which he attributes to strenuous activities for work.  He has no synovitis or dactylitis on examination today.  No evidence of Achilles tendinitis or plantar fasciitis.  No nocturnal pain.  His morning stiffness has been lasting for about 1 hour daily.  He has clinically been doing well on Humira  40 sq injections once every 14 days.  He is  tolerating Humira  without any side effects or injection site reactions.  He has not had any recent gaps in therapy.  No medication changes will be made at this time.  He was advised to notify us  if he develops signs or symptoms of a flare.  He will follow-up in the office in 5 months or sooner if needed.  High risk medication use - Humira  40 mg sq injections every 14 days. CBC and CMP updated on 04/29/24.  Orders for CBC and CMP were released today. CXR 08/16/23.  Future order for CXR placed today.   Lipid panel updated on 09/11/23.  No recent or recurrent infections.  Discussed the importance of holding humira  if he develops signs or symptoms of an infection and to resume once the infection has completely cleared.   - Plan: CBC with Differential/Platelet, Comprehensive metabolic panel with GFR, DG Chest 2 View  TB lung, latent - Completed 3 month of isoniazid , rifampin , and pyridoxin-under care of Dr. Fleeta Rothman. He will require yearly CXR for screening--chest x-ray updated on 08/16/2023. Future order for CXR placed today.   - Plan: DG Chest 2 View  HLA B27 (HLA B27 positive)  Trapezius muscle spasm: Not currently symptomatic.  Chronic right shoulder pain: Not currently symptomatic.  Good range of motion with no discomfort.  Chronic SI joint pain: No SI joint tenderness upon palpation today.  Facet arthropathy, lumbar: Under the care of a spine specialist and had an injection several months ago which has been helpful.  Other medical conditions are listed as follows:   Personal history of chronic sinusitis  Vitamin D  deficiency  History of migraine  Orders: Orders Placed This Encounter  Procedures   DG Chest 2 View   CBC with Differential/Platelet   Comprehensive metabolic panel with GFR   No orders of the defined types were placed in this encounter.    Follow-Up Instructions: Return in about 3 months (around 10/27/2024) for Spondyloarthritis .   Waddell CHRISTELLA Craze, PA-C  Note - This  record has been created using Dragon software.  Chart creation errors have been sought, but may not always  have been located. Such creation errors do not reflect on  the standard  of medical care.

## 2024-07-23 ENCOUNTER — Other Ambulatory Visit: Payer: Self-pay

## 2024-07-29 ENCOUNTER — Encounter: Payer: Self-pay | Admitting: Physician Assistant

## 2024-07-29 ENCOUNTER — Ambulatory Visit: Admitting: Physician Assistant

## 2024-07-29 VITALS — BP 134/82 | HR 54 | Temp 97.8°F | Resp 14 | Ht 69.0 in | Wt 202.2 lb

## 2024-07-29 DIAGNOSIS — Z8669 Personal history of other diseases of the nervous system and sense organs: Secondary | ICD-10-CM

## 2024-07-29 DIAGNOSIS — M545 Low back pain, unspecified: Secondary | ICD-10-CM

## 2024-07-29 DIAGNOSIS — M47819 Spondylosis without myelopathy or radiculopathy, site unspecified: Secondary | ICD-10-CM

## 2024-07-29 DIAGNOSIS — Z1589 Genetic susceptibility to other disease: Secondary | ICD-10-CM

## 2024-07-29 DIAGNOSIS — M25511 Pain in right shoulder: Secondary | ICD-10-CM | POA: Diagnosis not present

## 2024-07-29 DIAGNOSIS — Z227 Latent tuberculosis: Secondary | ICD-10-CM | POA: Diagnosis not present

## 2024-07-29 DIAGNOSIS — Z79899 Other long term (current) drug therapy: Secondary | ICD-10-CM

## 2024-07-29 DIAGNOSIS — M533 Sacrococcygeal disorders, not elsewhere classified: Secondary | ICD-10-CM

## 2024-07-29 DIAGNOSIS — E559 Vitamin D deficiency, unspecified: Secondary | ICD-10-CM

## 2024-07-29 DIAGNOSIS — M62838 Other muscle spasm: Secondary | ICD-10-CM

## 2024-07-29 DIAGNOSIS — Z8709 Personal history of other diseases of the respiratory system: Secondary | ICD-10-CM

## 2024-07-29 DIAGNOSIS — M47816 Spondylosis without myelopathy or radiculopathy, lumbar region: Secondary | ICD-10-CM

## 2024-07-29 DIAGNOSIS — G8929 Other chronic pain: Secondary | ICD-10-CM

## 2024-07-29 LAB — CBC WITH DIFFERENTIAL/PLATELET
Absolute Lymphocytes: 2054 {cells}/uL (ref 850–3900)
Absolute Monocytes: 585 {cells}/uL (ref 200–950)
Basophils Absolute: 63 {cells}/uL (ref 0–200)
Basophils Relative: 0.8 %
Eosinophils Absolute: 711 {cells}/uL — ABNORMAL HIGH (ref 15–500)
Eosinophils Relative: 9 %
HCT: 45.7 % (ref 39.4–51.1)
Hemoglobin: 15.7 g/dL (ref 13.2–17.1)
MCH: 32 pg (ref 27.0–33.0)
MCHC: 34.4 g/dL (ref 31.6–35.4)
MCV: 93.1 fL (ref 81.4–101.7)
MPV: 11.2 fL (ref 7.5–12.5)
Monocytes Relative: 7.4 %
Neutro Abs: 4487 {cells}/uL (ref 1500–7800)
Neutrophils Relative %: 56.8 %
Platelets: 246 Thousand/uL (ref 140–400)
RBC: 4.91 Million/uL (ref 4.20–5.80)
RDW: 12.6 % (ref 11.0–15.0)
Total Lymphocyte: 26 %
WBC: 7.9 Thousand/uL (ref 3.8–10.8)

## 2024-07-29 LAB — COMPREHENSIVE METABOLIC PANEL WITH GFR
AG Ratio: 2.3 (calc) (ref 1.0–2.5)
ALT: 24 U/L (ref 9–46)
AST: 24 U/L (ref 10–40)
Albumin: 4.5 g/dL (ref 3.6–5.1)
Alkaline phosphatase (APISO): 77 U/L (ref 36–130)
BUN: 15 mg/dL (ref 7–25)
CO2: 26 mmol/L (ref 20–32)
Calcium: 9.6 mg/dL (ref 8.6–10.3)
Chloride: 105 mmol/L (ref 98–110)
Creat: 1.27 mg/dL (ref 0.60–1.29)
Globulin: 2 g/dL (ref 1.9–3.7)
Glucose, Bld: 102 mg/dL — ABNORMAL HIGH (ref 65–99)
Potassium: 4 mmol/L (ref 3.5–5.3)
Sodium: 140 mmol/L (ref 135–146)
Total Bilirubin: 0.8 mg/dL (ref 0.2–1.2)
Total Protein: 6.5 g/dL (ref 6.1–8.1)
eGFR: 70 mL/min/1.73m2 (ref >=60)

## 2024-07-30 ENCOUNTER — Ambulatory Visit: Payer: Self-pay | Admitting: Physician Assistant

## 2024-07-30 NOTE — Progress Notes (Signed)
 Absolute eosinophils are elevated, rest of CBC WNL CMP WNL

## 2024-08-12 ENCOUNTER — Other Ambulatory Visit (HOSPITAL_COMMUNITY): Payer: Self-pay

## 2024-08-19 ENCOUNTER — Other Ambulatory Visit: Payer: Self-pay

## 2024-08-20 ENCOUNTER — Other Ambulatory Visit: Payer: Self-pay

## 2024-08-20 ENCOUNTER — Encounter: Payer: Self-pay | Admitting: Cardiovascular Disease

## 2024-08-20 ENCOUNTER — Telehealth: Payer: Self-pay

## 2024-08-20 NOTE — Telephone Encounter (Signed)
 Received notification from [facility] pharmacy that patient requires a new authorization for their medication.  Submitted an URGENT Prior Authorization request to [facility] for HUMIRA  via CoverMyMeds. Will update once we receive a response.  Key: [id number]

## 2024-08-20 NOTE — Telephone Encounter (Signed)
 Received notification from [facility] regarding a prior authorization for HUMIRA . Authorization has been APPROVED from 08/20/2024 to 08/20/2025.   Authorization # [id number]  Message sent to call center  Sherry Pennant, PharmD, MPH, BCPS, CPP Clinical Pharmacist

## 2024-08-20 NOTE — Progress Notes (Unsigned)
 PA approved through 08/20/2025. Please reprocess.

## 2024-08-21 ENCOUNTER — Other Ambulatory Visit (HOSPITAL_COMMUNITY): Payer: Self-pay

## 2024-08-25 ENCOUNTER — Other Ambulatory Visit (HOSPITAL_COMMUNITY): Payer: Self-pay

## 2024-08-27 ENCOUNTER — Other Ambulatory Visit: Payer: Self-pay

## 2024-08-27 NOTE — Progress Notes (Signed)
 Specialty Pharmacy Refill Coordination Note  Dennis Todd is a 49 y.o. male contacted today regarding refills of specialty medication(s) Adalimumab  (Humira  (2 Pen))   Patient requested Delivery   Delivery date: 08/28/24   Verified address: [address] [name], [place]   Medication will be filled on: 08/27/24

## 2024-09-07 ENCOUNTER — Other Ambulatory Visit: Payer: Self-pay

## 2024-09-17 ENCOUNTER — Other Ambulatory Visit: Payer: Self-pay

## 2024-09-17 ENCOUNTER — Other Ambulatory Visit: Payer: Self-pay | Admitting: Physician Assistant

## 2024-09-17 ENCOUNTER — Other Ambulatory Visit (HOSPITAL_COMMUNITY): Payer: Self-pay

## 2024-09-17 DIAGNOSIS — Z79899 Other long term (current) drug therapy: Secondary | ICD-10-CM

## 2024-09-17 DIAGNOSIS — M47819 Spondylosis without myelopathy or radiculopathy, site unspecified: Secondary | ICD-10-CM

## 2024-09-17 MED ORDER — HUMIRA (2 PEN) 40 MG/0.4ML ~~LOC~~ AJKT
AUTO-INJECTOR | SUBCUTANEOUS | 0 refills | Status: AC
  Filled 2024-09-17: qty 2.4, fill #0
  Filled 2024-09-18: qty 0.8, 28d supply, fill #0

## 2024-09-17 NOTE — Telephone Encounter (Signed)
 Last Fill: 06/15/2024  Labs: 07/29/2024 Absolute eosinophils are elevated, rest of CBC WNL CMP WNL  Chest x-ray: 08/16/2023 Chest x-ray is normal.   Next Visit: 10/28/2024  Last Visit: 07/29/2024  DX: Spondyloarthritis   Current Dose per office note on 07/29/2024: Humira  40 mg sq injections every 14 days.   Advised patient that he is due to update yearly chest x-ray and he can return to the [place] location to update (he went there last year). Patient verbalized understanding and will complete the x-ray.   Okay to refill Humira ?

## 2024-09-18 ENCOUNTER — Other Ambulatory Visit: Payer: Self-pay

## 2024-10-28 ENCOUNTER — Ambulatory Visit: Admitting: Physician Assistant

## 2025-02-03 ENCOUNTER — Ambulatory Visit: Admitting: Rheumatology
